# Patient Record
Sex: Female | Born: 1950
Health system: Southern US, Community
[De-identification: ages and names within clinical notes are randomized; demographics above are authoritative.]

## PROBLEM LIST (undated history)

## (undated) DIAGNOSIS — M1990 Unspecified osteoarthritis, unspecified site: Secondary | ICD-10-CM

## (undated) DIAGNOSIS — E079 Disorder of thyroid, unspecified: Secondary | ICD-10-CM

## (undated) DIAGNOSIS — K219 Gastro-esophageal reflux disease without esophagitis: Secondary | ICD-10-CM

## (undated) HISTORY — DX: Disorder of thyroid, unspecified: E07.9

## (undated) HISTORY — DX: Gastro-esophageal reflux disease without esophagitis: K21.9

## (undated) HISTORY — DX: Unspecified osteoarthritis, unspecified site: M19.90

## (undated) HISTORY — PX: SPINE SURGERY: SHX786

## (undated) HISTORY — PX: CERVICAL SPINE SURGERY: SHX589

---

## 1998-01-08 ENCOUNTER — Ambulatory Visit (HOSPITAL_COMMUNITY): Admission: RE | Admit: 1998-01-08 | Discharge: 1998-01-08 | Payer: Self-pay | Admitting: Gastroenterology

## 1999-04-26 ENCOUNTER — Encounter: Admission: RE | Admit: 1999-04-26 | Discharge: 1999-04-26 | Payer: Self-pay | Admitting: Gynecology

## 1999-04-26 ENCOUNTER — Encounter: Payer: Self-pay | Admitting: Gynecology

## 1999-06-24 ENCOUNTER — Ambulatory Visit (HOSPITAL_COMMUNITY): Admission: RE | Admit: 1999-06-24 | Discharge: 1999-06-24 | Payer: Self-pay | Admitting: Gastroenterology

## 2000-03-30 ENCOUNTER — Other Ambulatory Visit: Admission: RE | Admit: 2000-03-30 | Discharge: 2000-03-30 | Payer: Self-pay | Admitting: Gynecology

## 2000-04-27 ENCOUNTER — Encounter: Admission: RE | Admit: 2000-04-27 | Discharge: 2000-04-27 | Payer: Self-pay | Admitting: Gynecology

## 2000-04-27 ENCOUNTER — Encounter: Payer: Self-pay | Admitting: Gynecology

## 2001-03-31 ENCOUNTER — Other Ambulatory Visit: Admission: RE | Admit: 2001-03-31 | Discharge: 2001-03-31 | Payer: Self-pay | Admitting: Gynecology

## 2001-04-28 ENCOUNTER — Encounter: Payer: Self-pay | Admitting: Gynecology

## 2001-04-28 ENCOUNTER — Encounter: Admission: RE | Admit: 2001-04-28 | Discharge: 2001-04-28 | Payer: Self-pay | Admitting: Gynecology

## 2001-07-23 ENCOUNTER — Ambulatory Visit (HOSPITAL_COMMUNITY): Admission: RE | Admit: 2001-07-23 | Discharge: 2001-07-23 | Payer: Self-pay | Admitting: Gastroenterology

## 2001-07-23 ENCOUNTER — Encounter (INDEPENDENT_AMBULATORY_CARE_PROVIDER_SITE_OTHER): Payer: Self-pay | Admitting: Specialist

## 2002-04-04 ENCOUNTER — Other Ambulatory Visit: Admission: RE | Admit: 2002-04-04 | Discharge: 2002-04-04 | Payer: Self-pay | Admitting: Gynecology

## 2002-04-29 ENCOUNTER — Encounter: Payer: Self-pay | Admitting: Gynecology

## 2002-04-29 ENCOUNTER — Encounter: Admission: RE | Admit: 2002-04-29 | Discharge: 2002-04-29 | Payer: Self-pay | Admitting: Gynecology

## 2003-04-06 ENCOUNTER — Other Ambulatory Visit: Admission: RE | Admit: 2003-04-06 | Discharge: 2003-04-06 | Payer: Self-pay | Admitting: Gynecology

## 2003-05-01 ENCOUNTER — Encounter: Admission: RE | Admit: 2003-05-01 | Discharge: 2003-05-01 | Payer: Self-pay | Admitting: Gynecology

## 2004-04-12 ENCOUNTER — Other Ambulatory Visit: Admission: RE | Admit: 2004-04-12 | Discharge: 2004-04-12 | Payer: Self-pay | Admitting: Gynecology

## 2004-05-08 ENCOUNTER — Encounter: Admission: RE | Admit: 2004-05-08 | Discharge: 2004-05-08 | Payer: Self-pay | Admitting: Gynecology

## 2005-04-15 ENCOUNTER — Other Ambulatory Visit: Admission: RE | Admit: 2005-04-15 | Discharge: 2005-04-15 | Payer: Self-pay | Admitting: Gynecology

## 2005-05-21 ENCOUNTER — Encounter: Admission: RE | Admit: 2005-05-21 | Discharge: 2005-05-21 | Payer: Self-pay | Admitting: Gynecology

## 2006-04-16 ENCOUNTER — Other Ambulatory Visit: Admission: RE | Admit: 2006-04-16 | Discharge: 2006-04-16 | Payer: Self-pay | Admitting: Gynecology

## 2006-05-25 ENCOUNTER — Encounter: Admission: RE | Admit: 2006-05-25 | Discharge: 2006-05-25 | Payer: Self-pay | Admitting: Gynecology

## 2007-05-26 ENCOUNTER — Encounter: Admission: RE | Admit: 2007-05-26 | Discharge: 2007-05-26 | Payer: Self-pay | Admitting: Gynecology

## 2008-05-26 ENCOUNTER — Encounter: Admission: RE | Admit: 2008-05-26 | Discharge: 2008-05-26 | Payer: Self-pay | Admitting: Gynecology

## 2009-05-30 ENCOUNTER — Encounter: Admission: RE | Admit: 2009-05-30 | Discharge: 2009-05-30 | Payer: Self-pay | Admitting: Gynecology

## 2010-04-30 ENCOUNTER — Other Ambulatory Visit: Payer: Self-pay | Admitting: Gynecology

## 2010-05-03 ENCOUNTER — Other Ambulatory Visit: Payer: Self-pay | Admitting: Gynecology

## 2010-05-03 DIAGNOSIS — Z1231 Encounter for screening mammogram for malignant neoplasm of breast: Secondary | ICD-10-CM

## 2010-06-03 ENCOUNTER — Ambulatory Visit
Admission: RE | Admit: 2010-06-03 | Discharge: 2010-06-03 | Disposition: A | Discharge status: Home / Self Care | Payer: BC Managed Care – PPO | Source: Ambulatory Visit | Attending: Gynecology | Admitting: Gynecology

## 2010-06-03 DIAGNOSIS — Z1231 Encounter for screening mammogram for malignant neoplasm of breast: Secondary | ICD-10-CM

## 2010-08-09 NOTE — Procedures (Signed)
[place]. [facility]  Patient:    Karla [name], Karla [name]                        MRN: [id number] Proc. Date: 06/24/99 Adm. Date:  [id number] Disc. Date: [id number] Attending:  Deneen Harts CC:         Veverly Fells. Arletha Grippe, M.D.             Gretta Cool, M.D.                           Procedure Report  DATE OF INTERPRETATION:  June 28, 1999.  PROCEDURE:  24-hour interesophageal pH profile.  GASTROENTEROLOGIST:  Griffith Citron, M.D.  INDICATION:  A 60 year old white female with persistent symptoms of a foreign body at the base of the throat.  Improved with Prilosec 20 mg b.i.d.   Recurrent symptoms when reduced dose to Prilosec 20 mg daily.  Undergoing 24-hour interesophageal pH profile to further assess contribution from GE acid reflux.  PROCEDURE:  After reviewing the nature of the procedure with the patient, nasoesophageal intubation with a dual-channel pH probe was successfully completed on the day of study, June 24, 1999.  The study was completed over 24 hours without difficulty.  RESULTS:  In the distal channel, the patient was noted to have a DeMeester score of 46.3 which is strongly significant.  In the upright position, 34 acid reflux episodes occurred, none lasting over 5 minutes.  Total acid exposure was 2.6% of the total study.  In the supine position, the patient experienced fewer episodes, 11 in all; however, 3 lasted over 5 minutes with the longest episode lasting for 37 minutes.  This resulted in 10.2% acid exposure time.  Symptoms of belching were the only ones noted, and these correlated weakly with  acid reflux with a symptom index of 11%.  She experienced no pain symptoms during the course of the study.  In the proximal probe, there were six episodes upright and 4 episodes occurring in a supine position.  Again, when lying supine, two episodes lasted over 5 minutes, the longest lasting for 15 minutes.  ASSESSMENT:   Significant gastroesophageal acid reflux.  The results of this study would suggest symptoms of foreign body sensation at the base of the throat may ell be due to gastroesophageal acid reflux.  RECOMMENDATION:  Aggressive PPI therapy. DD:  06/28/99 TD:  06/29/99 Job: [id number]

## 2011-05-05 ENCOUNTER — Other Ambulatory Visit: Payer: Self-pay | Admitting: Gynecology

## 2011-05-08 ENCOUNTER — Other Ambulatory Visit: Payer: Self-pay | Admitting: Gynecology

## 2011-05-08 DIAGNOSIS — Z1231 Encounter for screening mammogram for malignant neoplasm of breast: Secondary | ICD-10-CM

## 2011-05-22 ENCOUNTER — Other Ambulatory Visit: Payer: Self-pay | Admitting: Family Medicine

## 2011-05-22 DIAGNOSIS — R1013 Epigastric pain: Secondary | ICD-10-CM

## 2011-05-28 ENCOUNTER — Ambulatory Visit
Admission: RE | Admit: 2011-05-28 | Discharge: 2011-05-28 | Disposition: A | Discharge status: Home / Self Care | Payer: BC Managed Care – PPO | Source: Ambulatory Visit | Attending: Family Medicine | Admitting: Family Medicine

## 2011-05-28 DIAGNOSIS — R1013 Epigastric pain: Secondary | ICD-10-CM

## 2011-06-10 ENCOUNTER — Ambulatory Visit: Payer: BC Managed Care – PPO

## 2011-06-17 ENCOUNTER — Ambulatory Visit
Admission: RE | Admit: 2011-06-17 | Discharge: 2011-06-17 | Disposition: A | Discharge status: Home / Self Care | Payer: BC Managed Care – PPO | Source: Ambulatory Visit | Attending: Gynecology | Admitting: Gynecology

## 2011-06-17 DIAGNOSIS — Z1231 Encounter for screening mammogram for malignant neoplasm of breast: Secondary | ICD-10-CM

## 2011-08-27 ENCOUNTER — Other Ambulatory Visit: Payer: Self-pay | Admitting: Neurosurgery

## 2011-08-27 DIAGNOSIS — M5410 Radiculopathy, site unspecified: Secondary | ICD-10-CM

## 2011-08-27 DIAGNOSIS — M542 Cervicalgia: Secondary | ICD-10-CM

## 2011-09-05 ENCOUNTER — Ambulatory Visit
Admission: RE | Admit: 2011-09-05 | Discharge: 2011-09-05 | Disposition: A | Discharge status: Home / Self Care | Payer: BC Managed Care – PPO | Source: Ambulatory Visit | Attending: Neurosurgery | Admitting: Neurosurgery

## 2011-09-05 VITALS — BP 129/63 | HR 61 | Ht 62.0 in | Wt 123.0 lb

## 2011-09-05 DIAGNOSIS — M542 Cervicalgia: Secondary | ICD-10-CM

## 2011-09-05 DIAGNOSIS — M5410 Radiculopathy, site unspecified: Secondary | ICD-10-CM

## 2011-09-05 MED ORDER — DIAZEPAM 5 MG PO TABS
5.0000 mg | ORAL_TABLET | Freq: Once | ORAL | Status: AC
  Administered 2011-09-05: 5 mg via ORAL

## 2011-09-05 MED ORDER — ONDANSETRON HCL 4 MG/2ML IJ SOLN: 4.0000 mg | Freq: Four times a day (QID) | INTRAMUSCULAR | Status: DC | PRN

## 2011-09-05 MED ORDER — IOHEXOL 300 MG/ML  SOLN
9.0000 mL | Freq: Once | INTRAMUSCULAR | Status: AC | PRN
  Administered 2011-09-05: 9 mL via INTRATHECAL

## 2011-09-05 NOTE — Discharge Instructions (Signed)

## 2011-09-05 NOTE — Progress Notes (Signed)
Pt awaiting CT scan.

## 2012-05-18 ENCOUNTER — Other Ambulatory Visit: Payer: Self-pay | Admitting: Gynecology

## 2012-05-31 ENCOUNTER — Other Ambulatory Visit: Payer: Self-pay

## 2012-05-31 DIAGNOSIS — Z1231 Encounter for screening mammogram for malignant neoplasm of breast: Secondary | ICD-10-CM

## 2012-06-24 ENCOUNTER — Ambulatory Visit
Admission: RE | Admit: 2012-06-24 | Discharge: 2012-06-24 | Disposition: A | Discharge status: Home / Self Care | Payer: BC Managed Care – PPO | Source: Ambulatory Visit

## 2012-06-24 DIAGNOSIS — Z1231 Encounter for screening mammogram for malignant neoplasm of breast: Secondary | ICD-10-CM

## 2012-07-16 ENCOUNTER — Other Ambulatory Visit: Payer: Self-pay

## 2012-07-16 MED ORDER — OMEPRAZOLE-SODIUM BICARBONATE 40-1100 MG PO CAPS: 1.0000 | ORAL_CAPSULE | Freq: Every day | ORAL | Status: DC

## 2012-08-18 ENCOUNTER — Other Ambulatory Visit: Payer: Self-pay | Admitting: *Deleted

## 2012-08-18 MED ORDER — LEVOTHYROXINE SODIUM 88 MCG PO TABS: 88.0000 ug | ORAL_TABLET | Freq: Every day | ORAL | Status: DC

## 2012-08-18 NOTE — Telephone Encounter (Signed)
Last TSH was 02/06/12 and said to recheck in 3 weeks, pt hasnt been back

## 2012-09-11 ENCOUNTER — Other Ambulatory Visit: Payer: Self-pay | Admitting: Family Medicine

## 2012-09-13 NOTE — Telephone Encounter (Signed)
LAST OV 11/13. 

## 2012-10-11 ENCOUNTER — Other Ambulatory Visit: Payer: Self-pay | Admitting: Nurse Practitioner

## 2012-10-13 NOTE — Telephone Encounter (Signed)
LAST OV 11/13. MMM PT.

## 2013-01-09 ENCOUNTER — Other Ambulatory Visit: Payer: Self-pay | Admitting: Family Medicine

## 2013-01-11 NOTE — Telephone Encounter (Signed)
Last seen 02/06/12  DFS

## 2013-05-26 ENCOUNTER — Other Ambulatory Visit: Payer: Self-pay | Admitting: Gynecology

## 2013-05-26 DIAGNOSIS — M8580 Other specified disorders of bone density and structure, unspecified site: Secondary | ICD-10-CM

## 2013-05-26 DIAGNOSIS — Z1231 Encounter for screening mammogram for malignant neoplasm of breast: Secondary | ICD-10-CM

## 2013-05-26 DIAGNOSIS — E2839 Other primary ovarian failure: Secondary | ICD-10-CM

## 2013-05-26 DIAGNOSIS — Z780 Asymptomatic menopausal state: Secondary | ICD-10-CM

## 2013-05-30 ENCOUNTER — Other Ambulatory Visit: Payer: Self-pay | Admitting: *Deleted

## 2013-05-30 DIAGNOSIS — E039 Hypothyroidism, unspecified: Secondary | ICD-10-CM

## 2013-05-30 DIAGNOSIS — Z0000 Encounter for general adult medical examination without abnormal findings: Secondary | ICD-10-CM

## 2013-06-17 ENCOUNTER — Ambulatory Visit (INDEPENDENT_AMBULATORY_CARE_PROVIDER_SITE_OTHER): Payer: BC Managed Care – PPO | Admitting: Family Medicine

## 2013-06-17 ENCOUNTER — Encounter (INDEPENDENT_AMBULATORY_CARE_PROVIDER_SITE_OTHER): Payer: Self-pay

## 2013-06-17 VITALS — BP 145/70 | HR 84 | Temp 97.4°F | Ht 61.5 in | Wt 120.8 lb

## 2013-06-17 DIAGNOSIS — Z0000 Encounter for general adult medical examination without abnormal findings: Secondary | ICD-10-CM

## 2013-06-17 DIAGNOSIS — R51 Headache: Secondary | ICD-10-CM

## 2013-06-17 DIAGNOSIS — J029 Acute pharyngitis, unspecified: Secondary | ICD-10-CM

## 2013-06-17 DIAGNOSIS — E039 Hypothyroidism, unspecified: Secondary | ICD-10-CM

## 2013-06-17 DIAGNOSIS — R52 Pain, unspecified: Secondary | ICD-10-CM

## 2013-06-17 LAB — POCT CBC
Granulocyte percent: 62.5 %G (ref 37–80)
HCT, POC: 38.3 % (ref 37.7–47.9)
Hemoglobin: 12.5 g/dL (ref 12.2–16.2)
Lymph, poc: 2.1 (ref 0.6–3.4)
MCH: 31 pg (ref 27–31.2)
MCHC: 32.7 g/dL (ref 31.8–35.4)
MCV: 95 fL (ref 80–97)
MPV: 8.5 fL (ref 0–99.8)
POC Granulocyte: 4 (ref 2–6.9)
POC LYMPH PERCENT: 32.8 %L (ref 10–50)
Platelet Count, POC: 215 10*3/uL (ref 142–424)
RBC: 4 M/uL — AB (ref 4.04–5.48)
RDW, POC: 13 %
WBC: 6.4 10*3/uL (ref 4.6–10.2)

## 2013-06-17 LAB — POCT INFLUENZA A/B
Influenza A, POC: NEGATIVE
Influenza B, POC: NEGATIVE

## 2013-06-17 LAB — POCT RAPID STREP A (OFFICE): Rapid Strep A Screen: NEGATIVE

## 2013-06-17 MED ORDER — AZITHROMYCIN 250 MG PO TABS: ORAL_TABLET | ORAL | Status: DC

## 2013-06-17 NOTE — Progress Notes (Signed)
   Subjective:    Patient ID: Karla [name], female    DOB: Oct 15, 1950, 63 y.o.   MRN: [id number]  HPI This 63 y.o. female presents for evaluation of sore throat.  She has not had fever. She has been having sore throat for 2 days.   Review of Systems C/o sore throat No chest pain, SOB, HA, dizziness, vision change, N/V, diarrhea, constipation, dysuria, urinary urgency or frequency, myalgias, arthralgias or rash.     Objective:   Physical Exam  Vital signs noted  Well developed well nourished female.  HEENT - Head atraumatic Normocephalic                Eyes - PERRLA, Conjuctiva - clear Sclera- Clear EOMI                Ears - EAC's Wnl TM's Wnl Gross Hearing WNL                 Throat - oropharanx wnl Respiratory - Lungs CTA bilateral Cardiac - RRR S1 and S2 without murmur GI - Abdomen soft Nontender and bowel sounds active x 4 Extremities - No edema.  Results for orders placed in visit on 06/17/13  POCT RAPID STREP A (OFFICE)      Result Value Ref Range   Rapid Strep A Screen Negative  Negative  POCT INFLUENZA A/B      Result Value Ref Range   Influenza A, POC Negative     Influenza B, POC Negative    POCT CBC      Result Value Ref Range   WBC 6.4  4.6 - 10.2 K/uL   Lymph, poc 2.1  0.6 - 3.4   POC LYMPH PERCENT 32.8  10 - 50 %L   MID (cbc)    0 - 0.9   POC MID %    0 - 12 %M   POC Granulocyte 4.0  2 - 6.9   Granulocyte percent 62.5  37 - 80 %G   RBC 4.0 (*) 4.04 - 5.48 M/uL   Hemoglobin 12.5  12.2 - 16.2 g/dL   HCT, POC 38.3  37.7 - 47.9 %   MCV 95.0  80 - 97 fL   MCH, POC 31.0  27 - 31.2 pg   MCHC 32.7  31.8 - 35.4 g/dL   RDW, POC 13.0     Platelet Count, POC 215.0  142 - 424 K/uL   MPV 8.5  0 - 99.8 fL       Assessment & Plan:  Sore throat - Plan: POCT rapid strep A, POCT Influenza A/B, azithromycin (ZITHROMAX) 250 MG tablet  Body aches - Plan: POCT rapid strep A, POCT Influenza A/B, azithromycin (ZITHROMAX) 250 MG tablet  Headache(784.0) - Plan: POCT  rapid strep A, POCT Influenza A/B, azithromycin (ZITHROMAX) 250 MG tablet  Hypothyroidism - Plan: POCT CBC, BMP8+EGFR, Hepatic function panel, Thyroid Panel With TSH  Healthcare maintenance - Plan: POCT CBC, BMP8+EGFR, Hepatic function panel, Thyroid Panel With TSH  Push po fluids, rest, tylenol and motrin otc prn as directed for fever, arthralgias, and myalgias.  Follow up prn if sx's continue or persist.  Lysbeth Penner FNP

## 2013-06-18 LAB — BMP8+EGFR
BUN/Creatinine Ratio: 19 (ref 11–26)
BUN: 12 mg/dL (ref 8–27)
CALCIUM: 9.4 mg/dL (ref 8.7–10.3)
CHLORIDE: 102 mmol/L (ref 97–108)
CO2: 27 mmol/L (ref 18–29)
Creatinine, Ser: 0.64 mg/dL (ref 0.57–1.00)
GFR calc Af Amer: 112 mL/min/{1.73_m2} (ref >59)
GFR calc non Af Amer: 97 mL/min/{1.73_m2} (ref >59)
Glucose: 82 mg/dL (ref 65–99)
POTASSIUM: 4.2 mmol/L (ref 3.5–5.2)
SODIUM: 143 mmol/L (ref 134–144)

## 2013-06-18 LAB — HEPATIC FUNCTION PANEL
ALT: 15 IU/L (ref 0–32)
AST: 20 IU/L (ref 0–40)
Albumin: 4.4 g/dL (ref 3.6–4.8)
Alkaline Phosphatase: 66 IU/L (ref 39–117)
Bilirubin, Direct: 0.12 mg/dL (ref 0.00–0.40)
Total Bilirubin: 0.5 mg/dL (ref 0.0–1.2)
Total Protein: 6.9 g/dL (ref 6.0–8.5)

## 2013-06-18 LAB — THYROID PANEL WITH TSH
FREE THYROXINE INDEX: 3.2 (ref 1.2–4.9)
T3 UPTAKE RATIO: 27 % (ref 24–39)
T4, Total: 11.9 ug/dL (ref 4.5–12.0)
TSH: 0.534 u[IU]/mL (ref 0.450–4.500)

## 2013-06-29 ENCOUNTER — Ambulatory Visit
Admission: RE | Admit: 2013-06-29 | Discharge: 2013-06-29 | Disposition: A | Discharge status: Home / Self Care | Payer: BC Managed Care – PPO | Source: Ambulatory Visit | Attending: Gynecology | Admitting: Gynecology

## 2013-06-29 ENCOUNTER — Other Ambulatory Visit: Payer: Self-pay | Admitting: Gynecology

## 2013-06-29 ENCOUNTER — Ambulatory Visit: Payer: BC Managed Care – PPO

## 2013-06-29 DIAGNOSIS — Z1231 Encounter for screening mammogram for malignant neoplasm of breast: Secondary | ICD-10-CM

## 2013-06-29 DIAGNOSIS — Z780 Asymptomatic menopausal state: Secondary | ICD-10-CM

## 2013-06-29 DIAGNOSIS — M8580 Other specified disorders of bone density and structure, unspecified site: Secondary | ICD-10-CM

## 2013-06-29 DIAGNOSIS — E2839 Other primary ovarian failure: Secondary | ICD-10-CM

## 2013-07-12 ENCOUNTER — Other Ambulatory Visit: Payer: Self-pay | Admitting: Family Medicine

## 2013-07-25 ENCOUNTER — Encounter: Payer: Self-pay | Admitting: Family

## 2013-07-25 ENCOUNTER — Ambulatory Visit (INDEPENDENT_AMBULATORY_CARE_PROVIDER_SITE_OTHER): Payer: BC Managed Care – PPO | Admitting: Family

## 2013-07-25 VITALS — BP 138/71 | HR 70 | Temp 97.5°F | Ht 62.0 in | Wt 120.8 lb

## 2013-07-25 DIAGNOSIS — S30860A Insect bite (nonvenomous) of lower back and pelvis, initial encounter: Secondary | ICD-10-CM

## 2013-07-25 DIAGNOSIS — E039 Hypothyroidism, unspecified: Secondary | ICD-10-CM | POA: Insufficient documentation

## 2013-07-25 DIAGNOSIS — W57XXXA Bitten or stung by nonvenomous insect and other nonvenomous arthropods, initial encounter: Principal | ICD-10-CM

## 2013-07-25 DIAGNOSIS — K219 Gastro-esophageal reflux disease without esophagitis: Secondary | ICD-10-CM | POA: Insufficient documentation

## 2013-07-25 MED ORDER — DOXYCYCLINE HYCLATE 100 MG PO TABS: 100.0000 mg | ORAL_TABLET | Freq: Two times a day (BID) | ORAL | Status: DC

## 2013-07-25 NOTE — Progress Notes (Signed)
   Subjective:    Patient ID: Karla [name], female    DOB: Oct 29, 1950, 63 y.o.   MRN: [id number]  HPI Pt reports removing a tick off of her bottom on Saturday. Pt states it is red and swollen about a quarter size. Pt reports itching but no pain. Pt denies any fever or joint pain.    Review of Systems  Respiratory: Negative.   Cardiovascular: Negative.   Gastrointestinal: Negative.   Genitourinary: Negative.   All other systems reviewed and are negative.      Objective:   Physical Exam  Vitals reviewed. Constitutional: She is oriented to person, place, and time. She appears well-developed and well-nourished. No distress.  Cardiovascular: Normal rate, regular rhythm, normal heart sounds and intact distal pulses.   No murmur heard. Pulmonary/Chest: Effort normal and breath sounds normal. No respiratory distress. She has no wheezes.  Musculoskeletal: Normal range of motion. She exhibits no edema and no tenderness.  Neurological: She is alert and oriented to person, place, and time. She has normal reflexes. No cranial nerve deficit.  Skin: Skin is warm and dry. There is erythema.  Small erythema circular spot with mild swelling on left inner gluteal fold     Psychiatric: She has a normal mood and affect. Her behavior is normal. Judgment and thought content normal.    BP 138/71  Pulse 70  Temp(Src) 97.5 F (36.4 C) (Oral)  Ht 5\' 2"  (1.575 m)  Wt 120 lb 12.8 oz (54.795 kg)  BMI 22.09 kg/m2       Assessment & Plan:  1. Tick bite of buttock -Wear protective clothing while outside -Check site for redness or any s/s of infection -Report any fever, rash, or joint pain - doxycycline (VIBRA-TABS) 100 MG tablet; Take 1 tablet (100 mg total) by mouth 2 (two) times daily.  Dispense: 28 tablet; Refill: 0 -RTO prn if s/s worsen   Jannifer Rodneyhristy Raesha Coonrod, FNP

## 2013-07-25 NOTE — Patient Instructions (Signed)
Tick Bite Information Ticks are insects that attach themselves to the skin and draw blood for food. There are various types of ticks. Common types include wood ticks and deer ticks. Most ticks live in shrubs and grassy areas. Ticks can climb onto your body when you make contact with leaves or grass where the tick is waiting. The most common places on the body for ticks to attach themselves are the scalp, neck, armpits, waist, and groin. Most tick bites are harmless, but sometimes ticks carry germs that cause diseases. These germs can be spread to a person during the tick's feeding process. The chance of a disease spreading through a tick bite depends on:   The type of tick.  Time of year.   How long the tick is attached.   Geographic location.  HOW CAN YOU PREVENT TICK BITES? Take these steps to help prevent tick bites when you are outdoors:  Wear protective clothing. Long sleeves and long pants are best.   Wear white clothes so you can see ticks more easily.  Tuck your pant legs into your socks.   If walking on a trail, stay in the middle of the trail to avoid brushing against bushes.  Avoid walking through areas with long grass.  Put insect repellent on all exposed skin and along boot tops, pant legs, and sleeve cuffs.   Check clothing, hair, and skin repeatedly and before going inside.   Brush off any ticks that are not attached.  Take a shower or bath as soon as possible after being outdoors.  WHAT IS THE PROPER WAY TO REMOVE A TICK? Ticks should be removed as soon as possible to help prevent diseases caused by tick bites. 1. If latex gloves are available, put them on before trying to remove a tick.  2. Using fine-point tweezers, grasp the tick as close to the skin as possible. You may also use curved forceps or a tick removal tool. Grasp the tick as close to its head as possible. Avoid grasping the tick on its body. 3. Pull gently with steady upward pressure until  the tick lets go. Do not twist the tick or jerk it suddenly. This may break off the tick's head or mouth parts. 4. Do not squeeze or crush the tick's body. This could force disease-carrying fluids from the tick into your body.  5. After the tick is removed, wash the bite area and your hands with soap and water or other disinfectant such as alcohol. 6. Apply a small amount of antiseptic cream or ointment to the bite site.  7. Wash and disinfect any instruments that were used.  Do not try to remove a tick by applying a hot match, petroleum jelly, or fingernail polish to the tick. These methods do not work and may increase the chances of disease being spread from the tick bite.  WHEN SHOULD YOU SEEK MEDICAL CARE? Contact your health care provider if you are unable to remove a tick from your skin or if a part of the tick breaks off and is stuck in the skin.  After a tick bite, you need to be aware of signs and symptoms that could be related to diseases spread by ticks. Contact your health care provider if you develop any of the following in the days or weeks after the tick bite:  Unexplained fever.  Rash. A circular rash that appears days or weeks after the tick bite may indicate the possibility of Lyme disease. The rash may resemble   a target with a bull's-eye and may occur at a different part of your body than the tick bite.  Redness and swelling in the area of the tick bite.   Tender, swollen lymph glands.   Diarrhea.   Weight loss.   Cough.   Fatigue.   Muscle, joint, or bone pain.   Abdominal pain.   Headache.   Lethargy or a change in your level of consciousness.  Difficulty walking or moving your legs.   Numbness in the legs.   Paralysis.  Shortness of breath.   Confusion.   Repeated vomiting.  Document Released: 03/07/2000 Document Revised: 12/29/2012 Document Reviewed: 08/18/2012 [employer] Patient Information 2014 [employer].  

## 2013-12-01 ENCOUNTER — Ambulatory Visit (INDEPENDENT_AMBULATORY_CARE_PROVIDER_SITE_OTHER): Payer: BC Managed Care – PPO | Admitting: Family Medicine

## 2013-12-01 ENCOUNTER — Encounter: Payer: Self-pay | Admitting: Family Medicine

## 2013-12-01 ENCOUNTER — Ambulatory Visit (INDEPENDENT_AMBULATORY_CARE_PROVIDER_SITE_OTHER): Payer: BC Managed Care – PPO

## 2013-12-01 VITALS — BP 142/80 | HR 76 | Temp 97.8°F | Ht 62.0 in | Wt 120.0 lb

## 2013-12-01 DIAGNOSIS — K219 Gastro-esophageal reflux disease without esophagitis: Secondary | ICD-10-CM

## 2013-12-01 DIAGNOSIS — E039 Hypothyroidism, unspecified: Secondary | ICD-10-CM

## 2013-12-01 DIAGNOSIS — E559 Vitamin D deficiency, unspecified: Secondary | ICD-10-CM

## 2013-12-01 DIAGNOSIS — IMO0001 Reserved for inherently not codable concepts without codable children: Secondary | ICD-10-CM

## 2013-12-01 DIAGNOSIS — R030 Elevated blood-pressure reading, without diagnosis of hypertension: Secondary | ICD-10-CM

## 2013-12-01 DIAGNOSIS — Z0000 Encounter for general adult medical examination without abnormal findings: Secondary | ICD-10-CM

## 2013-12-01 DIAGNOSIS — Z23 Encounter for immunization: Secondary | ICD-10-CM

## 2013-12-01 LAB — POCT CBC
GRANULOCYTE PERCENT: 52.3 % (ref 37–80)
HCT, POC: 37.6 % — AB (ref 37.7–47.9)
Hemoglobin: 12.9 g/dL (ref 12.2–16.2)
LYMPH, POC: 1.9 (ref 0.6–3.4)
MCH: 32.7 pg — AB (ref 27–31.2)
MCHC: 34.4 g/dL (ref 31.8–35.4)
MCV: 95 fL (ref 80–97)
MPV: 8.2 fL (ref 0–99.8)
PLATELET COUNT, POC: 232 10*3/uL (ref 142–424)
POC Granulocyte: 2.5 (ref 2–6.9)
POC LYMPH PERCENT: 39.9 %L (ref 10–50)
RBC: 4 M/uL — AB (ref 4.04–5.48)
RDW, POC: 12.1 %
WBC: 4.8 10*3/uL (ref 4.6–10.2)

## 2013-12-01 MED ORDER — SYNTHROID 88 MCG PO TABS: 88.0000 ug | ORAL_TABLET | Freq: Every day | ORAL | Status: DC

## 2013-12-01 NOTE — Patient Instructions (Addendum)
Continue current medications. Continue good therapeutic lifestyle changes which include good diet and exercise. Fall precautions discussed with patient. If an FOBT was given today- please return it to our front desk. If you are over 61 years old - you may need Prevnar 13 or the adult Pneumonia vaccine.  Flu Shots will be available at our office starting mid- September. Please call and schedule a FLU CLINIC APPOINTMENT.   Continue to drink plenty of fluids, avoid dairy products and caffeine products as much as possible Check with your insurance regarding immunizations of shingles shot, Prevnar vaccination and tetanus Return the FOBT. Consider Align, a probiotic and taking this once daily

## 2013-12-01 NOTE — Progress Notes (Signed)
Subjective:    Patient ID: Karla [name], female    DOB: [birth year], 63 y.o.   MRN: [id number]  HPI Pt here for follow up and management of chronic medical problems. The patient has a history of hypothyroidism and GERD. She is also concerned that she may have irritable bowel syndrome. She also thinks she may have had a recent virus and this could be playing a role with her GI irritability. She is up-to-date on most of her health maintenance issues except for the shingles shot, Prevnar vaccination, and Tdap. She will also need to get a chest x-ray today. She will also return and FOBT. Her home blood pressure readings will be scanned into the record and they are good.        Patient Active Problem List   Diagnosis Date Noted  . GERD (gastroesophageal reflux disease) 07/25/2013  . Hypothyroidism 07/25/2013   Outpatient Encounter Prescriptions as of 12/01/2013  Medication Sig  . calcium carbonate 1250 MG capsule Take 1,250 mg by mouth 2 (two) times daily with a meal.  . Cholecalciferol (VITAMIN D) 2000 UNITS CAPS Take 1 capsule by mouth daily.  Marland Kitchen estradiol (ESTRACE) 0.1 MG/GM vaginal cream Place 1 Applicatorful vaginally 2 (two) times a week.  . ibandronate (BONIVA) 150 MG tablet Take 150 mg by mouth every 30 (thirty) days. Take in the morning with a full glass of water, on an empty stomach, and do not take anything else by mouth or lie down for the next 30 min.  Marland Kitchen omeprazole-sodium bicarbonate (ZEGERID) 40-1100 MG per capsule TAKE 1 CAPSULE BY MOUTH DAILY BEFORE BREAKFAST.  . SYNTHROID 88 MCG tablet Take 1 tablet (88 mcg total) by mouth daily before breakfast.  . [DISCONTINUED] levothyroxine (SYNTHROID, LEVOTHROID) 88 MCG tablet Take 1 tablet (88 mcg total) by mouth daily.  . [DISCONTINUED] doxycycline (VIBRA-TABS) 100 MG tablet Take 1 tablet (100 mg total) by mouth 2 (two) times daily.  . [DISCONTINUED] omeprazole-sodium bicarbonate (ZEGERID) 40-1100 MG per capsule TAKE 1 CAPSULE BY MOUTH  DAILY BEFORE BREAKFAST.    Review of Systems  Constitutional: Negative.   HENT: Negative.   Eyes: Negative.   Respiratory: Negative.   Cardiovascular: Negative.   Gastrointestinal: Negative.        Possible IBS- abdominal discomfort at times  Endocrine: Negative.   Genitourinary: Negative.   Musculoskeletal: Negative.   Skin: Negative.   Allergic/Immunologic: Negative.   Neurological: Negative.   Hematological: Negative.   Psychiatric/Behavioral: Negative.        Objective:   Physical Exam  Nursing note and vitals reviewed. Constitutional: She is oriented to person, place, and time. She appears well-developed and well-nourished. No distress.  HENT:  Head: Normocephalic.  Right Ear: External ear normal.  Left Ear: External ear normal.  Nose: Nose normal.  Mouth/Throat: Oropharynx is clear and moist.  Eyes: Conjunctivae and EOM are normal. Pupils are equal, round, and reactive to light. Right eye exhibits no discharge. Left eye exhibits no discharge. No scleral icterus.  Neck: Normal range of motion. Neck supple. No thyromegaly present.  Cardiovascular: Normal rate, regular rhythm, normal heart sounds and intact distal pulses.  Exam reveals no gallop and no friction rub.   No murmur heard. At 72 per minute  Pulmonary/Chest: Effort normal and breath sounds normal. No respiratory distress. She has no wheezes. She has no rales. She exhibits no tenderness.  Abdominal: Soft. Bowel sounds are normal. She exhibits no mass. There is no tenderness. There is no rebound and no guarding.  Musculoskeletal: Normal range of motion. She exhibits no edema and no tenderness.  Lymphadenopathy:    She has no cervical adenopathy.  Neurological: She is alert and oriented to person, place, and time. She has normal reflexes. No cranial nerve deficit.  Skin: Skin is warm and dry.  Psychiatric: She has a normal mood and affect. Her behavior is normal. Judgment and thought content normal.   BP  154/73  Pulse 76  Temp(Src) 97.8 F (36.6 C) (Oral)  Ht _0  (1.575 m)  Wt 120 lb (54.432 kg)  BMI 21.94 kg/m2  [facility] reading (PRIMARY) by  Dr. Brunilda Payor x-ray-- no active disease                                      Assessment & Plan:  1. Hypothyroidism, unspecified hypothyroidism type - POCT CBC - BMP8+EGFR - Hepatic function panel - NMR, lipoprofile - Thyroid Panel With TSH  2. Gastroesophageal reflux disease, esophagitis presence not specified - POCT CBC - BMP8+EGFR - Hepatic function panel - NMR, lipoprofile - DG Chest 2 View; Future  3. Health care maintenance - POCT CBC - BMP8+EGFR - Hepatic function panel - NMR, lipoprofile - DG Chest 2 View; Future  4. Vitamin D deficiency - Vit D  25 hydroxy (rtn osteoporosis monitoring)  5. Elevated blood pressure -Bring blood pressure cuff back and correlate with our readings  Meds ordered this encounter  Medications  . SYNTHROID 88 MCG tablet    Sig: Take 1 tablet (88 mcg total) by mouth daily before breakfast.    Dispense:  90 tablet    Refill:  3   Patient Instructions  Continue current medications. Continue good therapeutic lifestyle changes which include good diet and exercise. Fall precautions discussed with patient. If an FOBT was given today- please return it to our front desk. If you are over 61 years old - you may need Prevnar 20 or the adult Pneumonia vaccine.  Flu Shots will be available at our office starting mid- September. Please call and schedule a FLU CLINIC APPOINTMENT.   Continue to drink plenty of fluids, avoid dairy products and caffeine products as much as possible Check with your insurance regarding immunizations of shingles shot, Prevnar vaccination and tetanus Return the FOBT. Consider Align, a probiotic and taking this once daily   Arrie Senate MD

## 2013-12-02 ENCOUNTER — Telehealth: Payer: Self-pay | Admitting: Family Medicine

## 2013-12-02 ENCOUNTER — Telehealth: Payer: Self-pay

## 2013-12-02 LAB — NMR, LIPOPROFILE
Cholesterol: 165 mg/dL (ref 100–199)
HDL CHOLESTEROL BY NMR: 55 mg/dL (ref >39)
HDL Particle Number: 34.4 umol/L (ref >=30.5)
LDL Particle Number: 1281 nmol/L — ABNORMAL HIGH (ref <1000)
LDL Size: 21.5 nm (ref >20.5)
LDLC SERPL CALC-MCNC: 98 mg/dL (ref 0–99)
LP-IR Score: <25 (ref <=45)
Small LDL Particle Number: 524 nmol/L (ref <=527)
TRIGLYCERIDES BY NMR: 61 mg/dL (ref 0–149)

## 2013-12-02 LAB — BMP8+EGFR
BUN/Creatinine Ratio: 18 (ref 11–26)
BUN: 11 mg/dL (ref 8–27)
CALCIUM: 9.3 mg/dL (ref 8.7–10.3)
CO2: 28 mmol/L (ref 18–29)
CREATININE: 0.6 mg/dL (ref 0.57–1.00)
Chloride: 102 mmol/L (ref 97–108)
GFR calc Af Amer: 114 mL/min/{1.73_m2} (ref >59)
GFR calc non Af Amer: 99 mL/min/{1.73_m2} (ref >59)
Glucose: 83 mg/dL (ref 65–99)
Potassium: 4.1 mmol/L (ref 3.5–5.2)
SODIUM: 144 mmol/L (ref 134–144)

## 2013-12-02 LAB — THYROID PANEL WITH TSH
FREE THYROXINE INDEX: 3.5 (ref 1.2–4.9)
T3 UPTAKE RATIO: 28 % (ref 24–39)
T4 TOTAL: 12.6 ug/dL — AB (ref 4.5–12.0)
TSH: 1.03 u[IU]/mL (ref 0.450–4.500)

## 2013-12-02 LAB — HEPATIC FUNCTION PANEL
ALT: 34 IU/L — AB (ref 0–32)
AST: 34 IU/L (ref 0–40)
Albumin: 4.4 g/dL (ref 3.6–4.8)
Alkaline Phosphatase: 47 IU/L (ref 39–117)
BILIRUBIN TOTAL: 0.7 mg/dL (ref 0.0–1.2)
Bilirubin, Direct: 0.14 mg/dL (ref 0.00–0.40)
Total Protein: 7 g/dL (ref 6.0–8.5)

## 2013-12-02 LAB — VITAMIN D 25 HYDROXY (VIT D DEFICIENCY, FRACTURES): Vit D, 25-Hydroxy: 76.8 ng/mL (ref 30.0–100.0)

## 2013-12-02 NOTE — Telephone Encounter (Signed)
LM on home vm with CXR results as per DPR of 05/2013

## 2013-12-02 NOTE — Telephone Encounter (Signed)
Message copied by Roselee Culver on Fri Dec 02, 2013  8:42 AM ------      Message from: Ernestina Penna      Created: Thu Dec 01, 2013  1:25 PM       As per radiology report ------

## 2013-12-05 NOTE — Telephone Encounter (Signed)
Pt's insurance does cover zostavax and prev 13. She will plan to get these.

## 2013-12-30 ENCOUNTER — Telehealth: Payer: Self-pay | Admitting: Family Medicine

## 2013-12-30 NOTE — Telephone Encounter (Signed)
Advised that since both are inactive there is no minimum waiting period between injections. Appt scheduled for next week for flu shot. Patient aware.

## 2014-01-04 ENCOUNTER — Ambulatory Visit (INDEPENDENT_AMBULATORY_CARE_PROVIDER_SITE_OTHER): Payer: BC Managed Care – PPO

## 2014-01-04 DIAGNOSIS — Z23 Encounter for immunization: Secondary | ICD-10-CM

## 2014-02-22 ENCOUNTER — Other Ambulatory Visit: Payer: Self-pay | Admitting: Family Medicine

## 2014-02-27 LAB — FECAL OCCULT BLOOD, IMMUNOCHEMICAL: FECAL OCCULT BLD: NEGATIVE

## 2014-05-26 ENCOUNTER — Other Ambulatory Visit: Payer: Self-pay

## 2014-05-26 DIAGNOSIS — Z1231 Encounter for screening mammogram for malignant neoplasm of breast: Secondary | ICD-10-CM

## 2014-06-05 ENCOUNTER — Ambulatory Visit (INDEPENDENT_AMBULATORY_CARE_PROVIDER_SITE_OTHER): Payer: BLUE CROSS/BLUE SHIELD | Admitting: Family Medicine

## 2014-06-05 ENCOUNTER — Telehealth: Payer: Self-pay | Admitting: Family Medicine

## 2014-06-05 ENCOUNTER — Encounter: Payer: Self-pay | Admitting: Family Medicine

## 2014-06-05 VITALS — BP 142/72 | HR 81 | Temp 97.1°F | Ht 62.0 in | Wt 123.0 lb

## 2014-06-05 DIAGNOSIS — Z23 Encounter for immunization: Secondary | ICD-10-CM | POA: Diagnosis not present

## 2014-06-05 DIAGNOSIS — R030 Elevated blood-pressure reading, without diagnosis of hypertension: Secondary | ICD-10-CM

## 2014-06-05 DIAGNOSIS — R7989 Other specified abnormal findings of blood chemistry: Secondary | ICD-10-CM | POA: Diagnosis not present

## 2014-06-05 DIAGNOSIS — IMO0001 Reserved for inherently not codable concepts without codable children: Secondary | ICD-10-CM

## 2014-06-05 DIAGNOSIS — K219 Gastro-esophageal reflux disease without esophagitis: Secondary | ICD-10-CM

## 2014-06-05 DIAGNOSIS — E559 Vitamin D deficiency, unspecified: Secondary | ICD-10-CM | POA: Diagnosis not present

## 2014-06-05 DIAGNOSIS — E039 Hypothyroidism, unspecified: Secondary | ICD-10-CM

## 2014-06-05 LAB — POCT CBC
Granulocyte percent: 40.1 %G (ref 37–80)
HEMATOCRIT: 41.8 % (ref 37.7–47.9)
HEMOGLOBIN: 13 g/dL (ref 12.2–16.2)
LYMPH, POC: 2.7 (ref 0.6–3.4)
MCH: 30.2 pg (ref 27–31.2)
MCHC: 31.2 g/dL — AB (ref 31.8–35.4)
MCV: 96.7 fL (ref 80–97)
MPV: 8.3 fL (ref 0–99.8)
PLATELET COUNT, POC: 267 10*3/uL (ref 142–424)
POC Granulocyte: 2 (ref 2–6.9)
POC LYMPH PERCENT: 53.7 %L — AB (ref 10–50)
RBC: 4.32 M/uL (ref 4.04–5.48)
RDW, POC: 12.2 %
WBC: 5 10*3/uL (ref 4.6–10.2)

## 2014-06-05 NOTE — Patient Instructions (Addendum)
Continue current medications. Continue good therapeutic lifestyle changes which include good diet and exercise. Fall precautions discussed with patient. If an FOBT was given today- please return it to our front desk. If you are over 62 years old - you may need Prevnar 13 or the adult Pneumonia vaccine.  Flu Shots are still available at our office. If you still haven't had one please call to set up a nurse visit to get one.   After your visit with us today you will receive a survey in the mail or online from [employer] regarding your care with us. Please take a moment to fill this out. Your feedback is very important to us as you can help us better understand your patient needs as well as improve your experience and satisfaction. WE CARE ABOUT YOU!!!   The patient should continue to take her regular medicines We will call her with the results of the lab work as soon as it becomes available and she should take a copy of this to see her gastroenterologist and gynecologist at the time of her next visit with them. She should continue to walk and exercise regularly She should also consider getting an HIV screen as this is now being recommended

## 2014-06-05 NOTE — Progress Notes (Signed)
Subjective:    Patient ID: Karla [name], female    DOB: 1950/12/08, 64 y.o.   MRN: [id number]  HPI Pt here for follow up and management of chronic medical problems which includes hypothyroid or GERD. She is taking medications regularly. The patient is doing well as far as her reflux is concerned and her thyroid is concerned. She's not having any GERD symptoms and not having any palpitations, trouble swallowing edema etc. regarding the thyroid. She is walking and exercising daily. She brings in blood pressure readings from the outside all of these are excellent and they will be scanned into the record. She also had a recent blood pressure reading last week in the doctor's office it was 118/64. There is a family history of hypertension in both her dad and mom. The patient has also had a hepatitis screen and an outside office. In this was negative. Her health maintenance issues she is up-to-date on everything except for her shingles shot and she will get that today.         Patient Active Problem List   Diagnosis Date Noted  . GERD (gastroesophageal reflux disease) 07/25/2013  . Hypothyroidism 07/25/2013   Outpatient Encounter Prescriptions as of 06/05/2014  Medication Sig  . calcium carbonate 1250 MG capsule Take 500 mg by mouth daily.   . Cholecalciferol (VITAMIN D) 2000 UNITS CAPS Take 1 capsule by mouth daily.  Marland Kitchen DEXILANT 60 MG capsule Take 1 capsule by mouth daily.  Marland Kitchen estradiol (ESTRACE) 0.1 MG/GM vaginal cream Place 1 Applicatorful vaginally 2 (two) times a week.  . ibandronate (BONIVA) 150 MG tablet Take 150 mg by mouth every 30 (thirty) days. Take in the morning with a full glass of water, on an empty stomach, and do not take anything else by mouth or lie down for the next 30 min.  Marland Kitchen RESTASIS 0.05 % ophthalmic emulsion   . SYNTHROID 88 MCG tablet Take 1 tablet (88 mcg total) by mouth daily before breakfast.  . [DISCONTINUED] omeprazole-sodium bicarbonate (ZEGERID) 40-1100 MG per  capsule TAKE 1 CAPSULE BY MOUTH DAILY BEFORE BREAKFAST.    Review of Systems  Constitutional: Negative.   HENT: Negative.   Eyes: Negative.   Respiratory: Negative.   Cardiovascular: Negative.   Gastrointestinal: Negative.   Endocrine: Negative.   Genitourinary: Negative.   Musculoskeletal: Negative.   Skin: Negative.   Allergic/Immunologic: Negative.   Neurological: Negative.   Hematological: Negative.   Psychiatric/Behavioral: Negative.        Objective:   Physical Exam  Constitutional: She is oriented to person, place, and time. She appears well-developed and well-nourished. No distress.  HENT:  Head: Normocephalic and atraumatic.  Right Ear: External ear normal.  Left Ear: External ear normal.  Nose: Nose normal.  Mouth/Throat: Oropharynx is clear and moist.  Eyes: Conjunctivae and EOM are normal. Pupils are equal, round, and reactive to light. Right eye exhibits no discharge. Left eye exhibits no discharge. No scleral icterus.  Neck: Normal range of motion. Neck supple. No thyromegaly present.  Without thyroid bruits or cervical adenopathy  Cardiovascular: Normal rate, regular rhythm, normal heart sounds and intact distal pulses.  Exam reveals no gallop and no friction rub.   No murmur heard. At 72/m  Pulmonary/Chest: Effort normal and breath sounds normal. No respiratory distress. She has no wheezes. She has no rales. She exhibits no tenderness.  Clear anteriorly and posteriorly  Abdominal: Soft. Bowel sounds are normal. She exhibits no mass. There is no tenderness. There is no  rebound and no guarding.  No epigastric tenderness  Musculoskeletal: Normal range of motion. She exhibits no edema or tenderness.  Lymphadenopathy:    She has no cervical adenopathy.  Neurological: She is alert and oriented to person, place, and time. She has normal reflexes. No cranial nerve deficit.  Skin: Skin is warm and dry. No rash noted.  Psychiatric: She has a normal mood and affect.  Her behavior is normal. Judgment and thought content normal.  Nursing note and vitals reviewed.  BP 142/72 mmHg  Pulse 81  Temp(Src) 97.1 F (36.2 C) (Oral)  Ht '5\' 2"'  (1.575 m)  Wt 123 lb (55.792 kg)  BMI 22.49 kg/m2        Assessment & Plan:  1. Hypothyroidism, unspecified hypothyroidism type -The patient is feeling well regarding her thyroid and any adjustments in medicine will be done following lab work being drawn today. - POCT CBC - Thyroid Panel With TSH  2. Gastroesophageal reflux disease, esophagitis presence not specified -Patient is not having any reflux symptoms and she should continue with her current excellent - POCT CBC - Hepatic function panel  3. Vitamin D deficiency -She does have a history of osteopenia and patient should continue with her vitamin D and calcium replacement -She should also continue walking daily and get her DEXA scans on a regular basis -She should continue with her Boniva - POCT CBC - Vit D  25 hydroxy (rtn osteoporosis monitoring)  4. Elevated blood pressure -Patient brings in outside readings and all of these are good. One of the readings came from the outside doctor's office and this was also good. She should continue to watch her sodium intake and exercise regularly - POCT CBC - BMP8+EGFR - Hepatic function panel - NMR, lipoprofile  5. Need for shingles vaccine -This will be given today - Varicella-zoster vaccine subcutaneous  Meds ordered this encounter  Medications  . DEXILANT 60 MG capsule    Sig: Take 1 capsule by mouth daily.  . RESTASIS 0.05 % ophthalmic emulsion    Sig:     Refill:  3   Patient Instructions  Continue current medications. Continue good therapeutic lifestyle changes which include good diet and exercise. Fall precautions discussed with patient. If an FOBT was given today- please return it to our front desk. If you are over 62 years old - you may need Prevnar 42 or the adult Pneumonia vaccine.  Flu  Shots are still available at our office. If you still haven't had one please call to set up a nurse visit to get one.   After your visit with [place] today you will receive a survey in the mail or online from [employer] regarding your care with [place]. Please take a moment to fill this out. Your feedback is very important to [place] as you can help [place] better understand your patient needs as well as improve your experience and satisfaction. WE CARE ABOUT YOU!!!   The patient should continue to take her regular medicines We will call her with the results of the lab work as soon as it becomes available and she should take a copy of this to see her gastroenterologist and gynecologist at the time of her next visit with them. She should continue to walk and exercise regularly She should also consider getting an HIV screen as this is now being recommended   Arrie Senate MD

## 2014-06-05 NOTE — Addendum Note (Signed)
Addended by: Magdalene RiverBULLINS, JAMIE H on: 06/05/2014 10:40 AM   Modules accepted: Orders

## 2014-06-05 NOTE — Telephone Encounter (Signed)
Spoke with patient.

## 2014-06-06 LAB — BMP8+EGFR
BUN / CREAT RATIO: 19 (ref 11–26)
BUN: 12 mg/dL (ref 8–27)
CO2: 25 mmol/L (ref 18–29)
Calcium: 9 mg/dL (ref 8.7–10.3)
Chloride: 102 mmol/L (ref 97–108)
Creatinine, Ser: 0.64 mg/dL (ref 0.57–1.00)
GFR calc non Af Amer: 97 mL/min/{1.73_m2} (ref >59)
GFR, EST AFRICAN AMERICAN: 111 mL/min/{1.73_m2} (ref >59)
Glucose: 80 mg/dL (ref 65–99)
Potassium: 4 mmol/L (ref 3.5–5.2)
Sodium: 144 mmol/L (ref 134–144)

## 2014-06-06 LAB — THYROID PANEL WITH TSH
Free Thyroxine Index: 2.9 (ref 1.2–4.9)
T3 Uptake Ratio: 26 % (ref 24–39)
T4, Total: 11.1 ug/dL (ref 4.5–12.0)
TSH: 1.69 u[IU]/mL (ref 0.450–4.500)

## 2014-06-06 LAB — NMR, LIPOPROFILE
Cholesterol: 174 mg/dL (ref 100–199)
HDL CHOLESTEROL BY NMR: 60 mg/dL (ref >39)
HDL Particle Number: 34.7 umol/L (ref >=30.5)
LDL PARTICLE NUMBER: 1294 nmol/L — AB (ref <1000)
LDL Size: 21.7 nm (ref >20.5)
LDL-C: 102 mg/dL — AB (ref 0–99)
LP-IR Score: <25 (ref <=45)
Small LDL Particle Number: 282 nmol/L (ref <=527)
Triglycerides by NMR: 62 mg/dL (ref 0–149)

## 2014-06-06 LAB — HEPATIC FUNCTION PANEL
ALBUMIN: 4.6 g/dL (ref 3.6–4.8)
ALK PHOS: 55 IU/L (ref 39–117)
ALT: 21 IU/L (ref 0–32)
AST: 27 IU/L (ref 0–40)
Bilirubin Total: 1.3 mg/dL — ABNORMAL HIGH (ref 0.0–1.2)
Bilirubin, Direct: 0.23 mg/dL (ref 0.00–0.40)
Total Protein: 7.3 g/dL (ref 6.0–8.5)

## 2014-06-06 LAB — VITAMIN D 25 HYDROXY (VIT D DEFICIENCY, FRACTURES): Vit D, 25-Hydroxy: 49.9 ng/mL (ref 30.0–100.0)

## 2014-06-30 ENCOUNTER — Other Ambulatory Visit (INDEPENDENT_AMBULATORY_CARE_PROVIDER_SITE_OTHER): Payer: BLUE CROSS/BLUE SHIELD

## 2014-06-30 DIAGNOSIS — R7989 Other specified abnormal findings of blood chemistry: Secondary | ICD-10-CM

## 2014-06-30 LAB — POCT CBC
Granulocyte percent: 53.3 %G (ref 37–80)
HCT, POC: 41.3 % (ref 37.7–47.9)
Hemoglobin: 13.1 g/dL (ref 12.2–16.2)
Lymph, poc: 2.2 (ref 0.6–3.4)
MCH, POC: 30.5 pg (ref 27–31.2)
MCHC: 31.7 g/dL — AB (ref 31.8–35.4)
MCV: 96.1 fL (ref 80–97)
MPV: 8.3 fL (ref 0–99.8)
PLATELET COUNT, POC: 323 10*3/uL (ref 142–424)
POC Granulocyte: 2.9 (ref 2–6.9)
POC LYMPH %: 40.7 % (ref 10–50)
RBC: 4.29 M/uL (ref 4.04–5.48)
RDW, POC: 12.3 %
WBC: 5.5 10*3/uL (ref 4.6–10.2)

## 2014-06-30 NOTE — Progress Notes (Signed)
Lab only 

## 2014-07-03 ENCOUNTER — Ambulatory Visit
Admission: RE | Admit: 2014-07-03 | Discharge: 2014-07-03 | Disposition: A | Discharge status: Home / Self Care | Payer: BLUE CROSS/BLUE SHIELD | Source: Ambulatory Visit

## 2014-07-03 DIAGNOSIS — Z1231 Encounter for screening mammogram for malignant neoplasm of breast: Secondary | ICD-10-CM

## 2014-07-04 ENCOUNTER — Telehealth: Payer: Self-pay | Admitting: Family Medicine

## 2014-07-05 NOTE — Telephone Encounter (Signed)
Labs mailed 07/05/14 to patient.

## 2014-08-31 ENCOUNTER — Encounter: Payer: Self-pay | Admitting: Pharmacist

## 2014-08-31 ENCOUNTER — Ambulatory Visit (INDEPENDENT_AMBULATORY_CARE_PROVIDER_SITE_OTHER): Payer: BLUE CROSS/BLUE SHIELD | Admitting: Pharmacist

## 2014-08-31 VITALS — BP 122/72 | HR 75 | Ht 62.0 in | Wt 124.0 lb

## 2014-08-31 DIAGNOSIS — E7800 Pure hypercholesterolemia, unspecified: Secondary | ICD-10-CM

## 2014-08-31 NOTE — Progress Notes (Signed)
Lipid Clinic Consultation  Chief Complaint:   Chief Complaint  Patient presents with  . Hyperlipidemia   HPI:  Patient referred by Dr Christell Constant to address elevated LDL-P and discussed more aggressive TLC. Patient has never taken statin medication.  She does not take medication for HTN and does not have DM.   Her mother is 62yo and use to take medication for elevated cholesterol and also has HTN.   Her father has HTN.     Component Value Date/Time   CHOL 174 06/05/2014 0901   TRIG 62 06/05/2014 0901   HDL 60 06/05/2014 0901   Body mass index is 22.67 kg/(m^2).   Filed Vitals:   08/31/14 0841  BP: 122/72  Pulse: 75    CHD/CHF Risk Equivalents:  none  AHA 10 year ASCVD risk = 3.7% Primary Problem(s):  LDL or LDL-P elevated  Current NCEP Goals: LDL Goal < 100 HDL Goal >/= 45 Tg Goal < 802 Non-HDL Goal < 130  Secondary cause of hyperlipidemia present:  none Low fat diet followed?  Yes -   Low carb diet followed?  Yes -   Exercise?  Yes - walking 30 minutes a day  Assessment: LDL particle number elevated but patient at low risk of ASCVD.    Recommendations: 1.  Discussed mediterranean diet 2. Continue daily exercise 3.  Discussed CVD risk and ways to reduce risk.  4.  Recheck lipids in 3-6 months  Time spent counseling patient:  25 minutes    Henrene Pastor, PharmD, CPP

## 2014-11-10 ENCOUNTER — Other Ambulatory Visit: Payer: Self-pay | Admitting: Family Medicine

## 2014-11-16 ENCOUNTER — Other Ambulatory Visit: Payer: Self-pay | Admitting: Family Medicine

## 2014-12-08 ENCOUNTER — Ambulatory Visit (INDEPENDENT_AMBULATORY_CARE_PROVIDER_SITE_OTHER): Payer: BLUE CROSS/BLUE SHIELD | Admitting: Family Medicine

## 2014-12-08 ENCOUNTER — Encounter: Payer: Self-pay | Admitting: Family Medicine

## 2014-12-08 VITALS — BP 131/66 | HR 70 | Temp 97.2°F | Ht 62.0 in | Wt 123.0 lb

## 2014-12-08 DIAGNOSIS — M8580 Other specified disorders of bone density and structure, unspecified site: Secondary | ICD-10-CM | POA: Diagnosis not present

## 2014-12-08 DIAGNOSIS — E559 Vitamin D deficiency, unspecified: Secondary | ICD-10-CM

## 2014-12-08 DIAGNOSIS — R030 Elevated blood-pressure reading, without diagnosis of hypertension: Secondary | ICD-10-CM | POA: Diagnosis not present

## 2014-12-08 DIAGNOSIS — N959 Unspecified menopausal and perimenopausal disorder: Secondary | ICD-10-CM | POA: Diagnosis not present

## 2014-12-08 DIAGNOSIS — E039 Hypothyroidism, unspecified: Secondary | ICD-10-CM | POA: Diagnosis not present

## 2014-12-08 DIAGNOSIS — K219 Gastro-esophageal reflux disease without esophagitis: Secondary | ICD-10-CM

## 2014-12-08 DIAGNOSIS — IMO0001 Reserved for inherently not codable concepts without codable children: Secondary | ICD-10-CM

## 2014-12-08 MED ORDER — SYNTHROID 88 MCG PO TABS: ORAL_TABLET | ORAL | Status: DC

## 2014-12-08 NOTE — Patient Instructions (Addendum)
Continue current medications. Continue good therapeutic lifestyle changes which include good diet and exercise. Fall precautions discussed with patient. If an FOBT was given today- please return it to our front desk. If you are over 62 years old - you may need Prevnar 13 or the adult Pneumonia vaccine.  **Flu shots will be available soon--- please call and schedule a FLU-CLINIC appointment**  After your visit with [place] today you will receive a survey in the mail or online from [employer] regarding your care with [place]. Please take a moment to fill this out. Your feedback is very important to [place] as you can help [place] better understand your patient needs as well as improve your experience and satisfaction. WE CARE ABOUT YOU!!!   **Please join [place] SEPT.22, 2016 from 5:00 to 7:00pm for our OPEN HOUSE! Come out and meet our NEW providers** The patient should continue to walk and exercise regularly She should follow-up with her gynecologist as planned She should get her mammogram regularly She should follow-up with GI as recommended every 5 years for colonoscopy She should do her FOBT yearly She should continue to take her vitamin D and calcium regularly

## 2014-12-08 NOTE — Progress Notes (Signed)
Subjective:    Patient ID: Karla [name], female    DOB: Jul 27, 1950, 64 y.o.   MRN: [id number]  HPI Pt here for follow up and management of chronic medical problems which includes hypothyroid and vit D def. She is taking medications regularly. The patient is doing well with no complaints or symptoms. She is on hormone replacement therapy from her gynecologist using Estrace cream. She is also on thyroid replacement and medication for GERD. Her last thyroid profile about 6 months ago had her thyroid within normal limits. The patient denies fatigue or edema in her feet. She denies chest pain shortness of breath problems swallowing heartburn indigestion nausea vomiting diarrhea or blood in the stool. She is passing her water regularly without symptoms. She is up-to-date on her eye exams and her Pap smears.       Patient Active Problem List   Diagnosis Date Noted  . GERD (gastroesophageal reflux disease) 07/25/2013  . Hypothyroidism 07/25/2013   Outpatient Encounter Prescriptions as of 12/08/2014  Medication Sig  . calcium carbonate 1250 MG capsule Take 250 mg by mouth daily.   . Cholecalciferol (VITAMIN D) 2000 UNITS CAPS Take 1 capsule by mouth daily.  Marland Kitchen DEXILANT 60 MG capsule Take 1 capsule by mouth daily.  Marland Kitchen estradiol (ESTRACE) 0.1 MG/GM vaginal cream Place 1 Applicatorful vaginally 2 (two) times a week.  . ibandronate (BONIVA) 150 MG tablet Take 150 mg by mouth every 30 (thirty) days. Take in the morning with a full glass of water, on an empty stomach, and do not take anything else by mouth or lie down for the next 30 min.  Marland Kitchen RESTASIS 0.05 % ophthalmic emulsion   . SYNTHROID 88 MCG tablet TAKE 1 TABLET (88 MCG TOTAL) BY MOUTH DAILY BEFORE BREAKFAST.  . [DISCONTINUED] SYNTHROID 88 MCG tablet TAKE 1 TABLET (88 MCG TOTAL) BY MOUTH DAILY BEFORE BREAKFAST.   No facility-administered encounter medications on file as of 12/08/2014.      Review of Systems  Constitutional: Negative.   HENT:  Negative.   Eyes: Negative.   Respiratory: Negative.   Cardiovascular: Negative.   Gastrointestinal: Negative.   Endocrine: Negative.   Genitourinary: Negative.   Musculoskeletal: Negative.   Skin: Negative.   Allergic/Immunologic: Negative.   Neurological: Negative.   Hematological: Negative.   Psychiatric/Behavioral: Negative.        Objective:   Physical Exam  Constitutional: She is oriented to person, place, and time. She appears well-developed and well-nourished. No distress.  HENT:  Head: Normocephalic and atraumatic.  Right Ear: External ear normal.  Left Ear: External ear normal.  Nose: Nose normal.  Mouth/Throat: Oropharynx is clear and moist. No oropharyngeal exudate.  Eyes: Conjunctivae and EOM are normal. Pupils are equal, round, and reactive to light. Right eye exhibits no discharge. Left eye exhibits no discharge. No scleral icterus.  Neck: Normal range of motion. Neck supple. No thyromegaly present.  Without thyroid enlargement bruits or anterior cervical adenopathy  Cardiovascular: Normal rate, regular rhythm, normal heart sounds and intact distal pulses.  Exam reveals no gallop and no friction rub.   No murmur heard. The heart has a regular rate and rhythm at 60/m without murmurs  Pulmonary/Chest: Effort normal and breath sounds normal. No respiratory distress. She has no wheezes. She has no rales. She exhibits no tenderness.  Clear anteriorly and posteriorly  Abdominal: Soft. Bowel sounds are normal. She exhibits no mass. There is no tenderness. There is no rebound and no guarding.  Nontender without epigastric  discomfort or suprapubic discomfort and no organ enlargement or masses  Musculoskeletal: Normal range of motion. She exhibits no edema or tenderness.  Lymphadenopathy:    She has no cervical adenopathy.  Neurological: She is alert and oriented to person, place, and time. She has normal reflexes. No cranial nerve deficit.  Skin: Skin is warm and dry. No  rash noted.  Psychiatric: She has a normal mood and affect. Her behavior is normal. Judgment and thought content normal.  Nursing note and vitals reviewed.  BP 131/66 mmHg  Pulse 70  Temp(Src) 97.2 F (36.2 C) (Oral)  Ht '5\' 2"'  (1.575 m)  Wt 123 lb (55.792 kg)  BMI 22.49 kg/m2        Assessment & Plan:  1. Hypothyroidism, unspecified hypothyroidism type -Pending results of upcoming lab work no change in treatment - Thyroid Panel With TSH  2. Gastroesophageal reflux disease, esophagitis presence not specified -The patient is having no GERD symptoms and she will continue with her current PPI - Hepatic function panel - NMR, lipoprofile  3. Vitamin D deficiency -She does have a history of osteopenia and vitamin D deficiency and she will continue her current treatment pending results of lab work - Vit D  25 hydroxy (rtn osteoporosis monitoring)  4. Elevated blood pressure -Home blood pressure readings were brought in for review and these are much better than those obtained in the office. She will continue monitoring her sodium intake and keeping her weight down and watching her diet closely. - BMP8+EGFR - Hepatic function panel - NMR, lipoprofile  5. Menopausal and perimenopausal disorder -She will continue with Estrace cream per recommendation of GYN  6. Osteopenia -The patient's next mammogram is due in April 2017  No orders of the defined types were placed in this encounter.   The patient's thyroid medicine will be refilled for 1 year.  Patient Instructions  Continue current medications. Continue good therapeutic lifestyle changes which include good diet and exercise. Fall precautions discussed with patient. If an FOBT was given today- please return it to our front desk. If you are over 54 years old - you may need Prevnar 57 or the adult Pneumonia vaccine.  **Flu shots will be available soon--- please call and schedule a FLU-CLINIC appointment**  After your visit  with [place] today you will receive a survey in the mail or online from [employer] regarding your care with [place]. Please take a moment to fill this out. Your feedback is very important to [place] as you can help [place] better understand your patient needs as well as improve your experience and satisfaction. WE CARE ABOUT YOU!!!   **Please join [place] SEPT.[email or web address], 2016 from 5:00 to 7:00pm for our OPEN HOUSE! Come out and meet our NEW providers** The patient should continue to walk and exercise regularly She should follow-up with her gynecologist as planned She should get her mammogram regularly She should follow-up with GI as recommended every 5 years for colonoscopy She should do her FOBT yearly She should continue to take her vitamin D and calcium regularly   Arrie Senate MD

## 2014-12-09 LAB — BMP8+EGFR
BUN/Creatinine Ratio: 20 (ref 11–26)
BUN: 14 mg/dL (ref 8–27)
CALCIUM: 9.1 mg/dL (ref 8.7–10.3)
CO2: 26 mmol/L (ref 18–29)
Chloride: 100 mmol/L (ref 97–108)
Creatinine, Ser: 0.69 mg/dL (ref 0.57–1.00)
GFR, EST AFRICAN AMERICAN: 108 mL/min/{1.73_m2} (ref >59)
GFR, EST NON AFRICAN AMERICAN: 94 mL/min/{1.73_m2} (ref >59)
Glucose: 81 mg/dL (ref 65–99)
Potassium: 4.3 mmol/L (ref 3.5–5.2)
Sodium: 141 mmol/L (ref 134–144)

## 2014-12-09 LAB — THYROID PANEL WITH TSH
Free Thyroxine Index: 3.1 (ref 1.2–4.9)
T3 Uptake Ratio: 27 % (ref 24–39)
T4 TOTAL: 11.6 ug/dL (ref 4.5–12.0)
TSH: 2.36 u[IU]/mL (ref 0.450–4.500)

## 2014-12-09 LAB — NMR, LIPOPROFILE
Cholesterol: 187 mg/dL (ref 100–199)
HDL Cholesterol by NMR: 54 mg/dL (ref >39)
HDL Particle Number: 33.3 umol/L (ref >=30.5)
LDL Particle Number: 1294 nmol/L — ABNORMAL HIGH (ref <1000)
LDL Size: 21.9 nm (ref >20.5)
LDL-C: 120 mg/dL — ABNORMAL HIGH (ref 0–99)
LP-IR Score: <25 (ref <=45)
SMALL LDL PARTICLE NUMBER: 233 nmol/L (ref <=527)
Triglycerides by NMR: 65 mg/dL (ref 0–149)

## 2014-12-09 LAB — HEPATIC FUNCTION PANEL
ALT: 18 IU/L (ref 0–32)
AST: 23 IU/L (ref 0–40)
Albumin: 4.5 g/dL (ref 3.6–4.8)
Alkaline Phosphatase: 51 IU/L (ref 39–117)
BILIRUBIN TOTAL: 0.9 mg/dL (ref 0.0–1.2)
BILIRUBIN, DIRECT: 0.19 mg/dL (ref 0.00–0.40)
Total Protein: 7.2 g/dL (ref 6.0–8.5)

## 2014-12-09 LAB — VITAMIN D 25 HYDROXY (VIT D DEFICIENCY, FRACTURES): Vit D, 25-Hydroxy: 53.1 ng/mL (ref 30.0–100.0)

## 2014-12-22 ENCOUNTER — Ambulatory Visit (INDEPENDENT_AMBULATORY_CARE_PROVIDER_SITE_OTHER): Payer: BLUE CROSS/BLUE SHIELD

## 2014-12-22 DIAGNOSIS — Z23 Encounter for immunization: Secondary | ICD-10-CM

## 2015-03-12 ENCOUNTER — Ambulatory Visit (INDEPENDENT_AMBULATORY_CARE_PROVIDER_SITE_OTHER): Payer: 59 | Admitting: Nurse Practitioner

## 2015-03-12 VITALS — BP 162/79 | HR 79 | Temp 97.1°F | Ht 62.0 in | Wt 124.0 lb

## 2015-03-12 DIAGNOSIS — R300 Dysuria: Secondary | ICD-10-CM | POA: Diagnosis not present

## 2015-03-12 DIAGNOSIS — N3000 Acute cystitis without hematuria: Secondary | ICD-10-CM

## 2015-03-12 LAB — POCT URINALYSIS DIPSTICK
Bilirubin, UA: NEGATIVE
Glucose, UA: NEGATIVE
KETONES UA: NEGATIVE
NITRITE UA: NEGATIVE
Protein, UA: NEGATIVE
Spec Grav, UA: 1.015
Urobilinogen, UA: NEGATIVE
pH, UA: 7

## 2015-03-12 LAB — POCT UA - MICROSCOPIC ONLY
Casts, Ur, LPF, POC: NEGATIVE
Crystals, Ur, HPF, POC: NEGATIVE
Yeast, UA: NEGATIVE

## 2015-03-12 MED ORDER — NITROFURANTOIN MONOHYD MACRO 100 MG PO CAPS: 100.0000 mg | ORAL_CAPSULE | Freq: Two times a day (BID) | ORAL | Status: DC

## 2015-03-12 NOTE — Progress Notes (Signed)
  Subjective:    Karla AlkenSheila Barcellos is a 64 y.o. female who complains of burning with urination, dysuria, frequency, nocturia, suprapubic pressure and urgency. She has had symptoms for 4 days. Patient also complains of none. Patient denies back pain, stomach ache and vaginal discharge. Patient does not have a history of recurrent UTI. Patient does not have a history of pyelonephritis.   The following portions of the patient's history were reviewed and updated as appropriate: allergies, current medications, past family history, past medical history, past social history, past surgical history and problem list.  Review of Systems Pertinent items are noted in HPI.    Objective:    General appearance: alert and cooperative Lungs: clear to auscultation bilaterally Heart: regular rate and rhythm, S1, S2 normal, no murmur, click, rub or gallop Abdomen: normal findings: soft, non-tender  Laboratory:  Results for orders placed or performed in visit on 03/12/15  POCT UA - Microscopic Only  Result Value Ref Range   WBC, Ur, HPF, POC 1-5    RBC, urine, microscopic occ    Bacteria, U Microscopic occ    Mucus, UA moderate    Epithelial cells, urine per micros occ    Crystals, Ur, HPF, POC negative    Casts, Ur, LPF, POC negative    Yeast, UA negative   POCT urinalysis dipstick  Result Value Ref Range   Color, UA gold    Clarity, UA clear    Glucose, UA negative    Bilirubin, UA negative    Ketones, UA negative    Spec Grav, UA 1.015    Blood, UA trace    pH, UA 7.0    Protein, UA negative    Urobilinogen, UA negative    Nitrite, UA negative    Leukocytes, UA small (1+) (A) Negative      Assessment:    Acute cystitis     Plan:  Take medication as prescribe Cotton underwear Take shower not bath Cranberry juice, yogurt Force fluids AZO over the counter X2 days Culture pending RTO prn   Meds ordered this encounter  Medications  . Omeprazole-Sodium Bicarbonate (ZEGERID OTC PO)   Sig: Take by mouth.  . nitrofurantoin, macrocrystal-monohydrate, (MACROBID) 100 MG capsule    Sig: Take 1 capsule (100 mg total) by mouth 2 (two) times daily. 1 po BId    Dispense:  14 capsule    Refill:  0    Order Specific Question:  Supervising Provider    Answer:  Ernestina PennaMOORE, DONALD W [[id number]    Mary-Margaret Daphine DeutscherMartin, FNP

## 2015-03-12 NOTE — Patient Instructions (Signed)
Take medication as prescribe Cotton underwear Take shower not bath Cranberry juice, yogurt Force fluids AZO over the counter X2 days Culture pending RTO prn  

## 2015-05-31 ENCOUNTER — Other Ambulatory Visit: Payer: Self-pay

## 2015-05-31 DIAGNOSIS — Z1231 Encounter for screening mammogram for malignant neoplasm of breast: Secondary | ICD-10-CM

## 2015-06-15 ENCOUNTER — Ambulatory Visit (INDEPENDENT_AMBULATORY_CARE_PROVIDER_SITE_OTHER): Payer: 59 | Admitting: Family Medicine

## 2015-06-15 ENCOUNTER — Encounter: Payer: Self-pay | Admitting: Family Medicine

## 2015-06-15 VITALS — BP 131/80 | HR 80 | Temp 97.1°F | Ht 62.0 in | Wt 125.0 lb

## 2015-06-15 DIAGNOSIS — E039 Hypothyroidism, unspecified: Secondary | ICD-10-CM | POA: Diagnosis not present

## 2015-06-15 DIAGNOSIS — R030 Elevated blood-pressure reading, without diagnosis of hypertension: Secondary | ICD-10-CM

## 2015-06-15 DIAGNOSIS — Z1211 Encounter for screening for malignant neoplasm of colon: Secondary | ICD-10-CM

## 2015-06-15 DIAGNOSIS — E559 Vitamin D deficiency, unspecified: Secondary | ICD-10-CM

## 2015-06-15 DIAGNOSIS — K219 Gastro-esophageal reflux disease without esophagitis: Secondary | ICD-10-CM | POA: Diagnosis not present

## 2015-06-15 DIAGNOSIS — IMO0001 Reserved for inherently not codable concepts without codable children: Secondary | ICD-10-CM

## 2015-06-15 NOTE — Progress Notes (Signed)
Subjective:    Patient ID: Karla [name], female    DOB: August 29, 1950, 65 y.o.   MRN: [id number]  HPI Pt here for follow up and management of chronic medical problems which includes hypothyroid and GERD. She is taking medications regularly.The patient is doing well overall. She does complain of some back pain. This occurs at times. She is due to return an FOBT and get lab work. She is planning to get her pelvic exam next week and her mammogram and April. She will also get a DEXA scan sometime this spring. The patient thinks her back pains due to her posture at work and she sits a lot. They just got some new ergonomic chairs which she is hoping will help a lot. The pain is in her upper back. She otherwise denies any chest pain shortness of breath trouble swallowing heartburn indigestion nausea vomiting diarrhea or blood in the stool. She denies abdominal pain. She is passing her water without problems. She sees the gastroenterologist yearly and he has suggested to her that she try Zantac instead of omeprazole and she plans to do this. She understands that she has to take this twice daily before breakfast and supper. The patient brings in home blood pressures for review and all of these were excellent and these readings will be scanned into the record.      Patient Active Problem List   Diagnosis Date Noted  . Menopausal and perimenopausal disorder 12/08/2014  . GERD (gastroesophageal reflux disease) 07/25/2013  . Hypothyroidism 07/25/2013   Outpatient Encounter Prescriptions as of 06/15/2015  Medication Sig  . calcium carbonate 1250 MG capsule Take 250 mg by mouth daily.   . Cholecalciferol (VITAMIN D) 2000 UNITS CAPS Take 1 capsule by mouth daily.  Marland Kitchen estradiol (ESTRACE) 0.1 MG/GM vaginal cream Place 1 Applicatorful vaginally 2 (two) times a week.  . ibandronate (BONIVA) 150 MG tablet Take 150 mg by mouth every 30 (thirty) days. Take in the morning with a full glass of water, on an empty stomach,  and do not take anything else by mouth or lie down for the next 30 min.  Earney Navy Bicarbonate (ZEGERID OTC PO) Take by mouth.  . RESTASIS 0.05 % ophthalmic emulsion   . SYNTHROID 88 MCG tablet TAKE 1 TABLET (88 MCG TOTAL) BY MOUTH DAILY BEFORE BREAKFAST.  . [DISCONTINUED] nitrofurantoin, macrocrystal-monohydrate, (MACROBID) 100 MG capsule Take 1 capsule (100 mg total) by mouth 2 (two) times daily. 1 po BId   No facility-administered encounter medications on file as of 06/15/2015.     Review of Systems  Constitutional: Negative.   HENT: Negative.   Eyes: Negative.   Respiratory: Negative.   Cardiovascular: Negative.   Gastrointestinal: Negative.   Endocrine: Negative.   Genitourinary: Negative.   Musculoskeletal: Positive for back pain.  Skin: Negative.   Allergic/Immunologic: Negative.   Neurological: Negative.   Hematological: Negative.   Psychiatric/Behavioral: Negative.        Objective:   Physical Exam  Constitutional: She is oriented to person, place, and time. She appears well-developed and well-nourished.  HENT:  Head: Normocephalic and atraumatic.  Right Ear: External ear normal.  Left Ear: External ear normal.  Nose: Nose normal.  Mouth/Throat: Oropharynx is clear and moist.  Eyes: Conjunctivae and EOM are normal. Pupils are equal, round, and reactive to light. Right eye exhibits no discharge. Left eye exhibits no discharge. No scleral icterus.  Neck: Normal range of motion. Neck supple. No JVD present. No thyromegaly present.  Cardiovascular: Normal  rate, regular rhythm, normal heart sounds and intact distal pulses.   No murmur heard. The rhythm is regular at 72/m  Pulmonary/Chest: Effort normal and breath sounds normal. No respiratory distress. She has no wheezes. She has no rales.  Abdominal: Soft. Bowel sounds are normal. She exhibits no mass. There is no tenderness. There is no rebound and no guarding.  Musculoskeletal: Normal range of motion. She  exhibits no edema or tenderness.  Lymphadenopathy:    She has no cervical adenopathy.  Neurological: She is alert and oriented to person, place, and time. She has normal reflexes. No cranial nerve deficit.  Skin: Skin is warm and dry. No rash noted.  Psychiatric: She has a normal mood and affect. Her behavior is normal. Judgment and thought content normal.  Nursing note and vitals reviewed.  BP 131/80 mmHg  Pulse 80  Temp(Src) 97.1 F (36.2 C) (Oral)  Ht _0  (1.575 m)  Wt 125 lb (56.7 kg)  BMI 22.86 kg/m2        Assessment & Plan:  1. Gastroesophageal reflux disease, esophagitis presence not specified -Try Zantac 150 twice daily before breakfast and supper and hold the omeprazole - CBC with Differential/Platelet  2. Hypothyroidism, unspecified hypothyroidism type -Continue current treatment pending results of lab work - CBC with Differential/Platelet - Thyroid Panel With TSH  3. Vitamin D deficiency -Continue vitamin D replacement pending results of lab work - CBC with Differential/Platelet - VITAMIN D 25 Hydroxy (Vit-D Deficiency, Fractures)  4. Elevated blood pressure -Blood pressure readings today in the office and those reviewed from home are all good and the patient will continue to monitor the blood pressure periodically and watch her sodium intake - BMP8+EGFR - CBC with Differential/Platelet - Hepatic function panel - NMR, lipoprofile  5. Special screening for malignant neoplasms, colon -Follow-up with gastroenterology as planned - Fecal occult blood, imunochemical; Future  No orders of the defined types were placed in this encounter.   Patient Instructions                       Medicare Annual Wellness Visit  [facility] and the medical providers at [facility] strive to bring you the best medical care.  In doing so we not only want to address your current medical conditions and concerns but also to detect new conditions early and  prevent illness, disease and health-related problems.    Medicare offers a yearly Wellness Visit which allows our clinical staff to assess your need for preventative services including immunizations, lifestyle education, counseling to decrease risk of preventable diseases and screening for fall risk and other medical concerns.    This visit is provided free of charge (no copay) for all Medicare recipients. The clinical pharmacists at [facility] have begun to conduct these Wellness Visits which will also include a thorough review of all your medications.    As you primary medical provider recommend that you make an appointment for your Annual Wellness Visit if you have not done so already this year.  You may set up this appointment before you leave today or you may call back ([phone]) and schedule an appointment.  Please make sure when you call that you mention that you are scheduling your Annual Wellness Visit with the clinical pharmacist so that the appointment may be made for the proper length of time.     Continue current medications. Continue good therapeutic lifestyle changes which include good diet and exercise.  Fall precautions discussed with patient. If an FOBT was given today- please return it to our front desk. If you are over 79 years old - you may need Prevnar 79 or the adult Pneumonia vaccine.  **Flu shots are available--- please call and schedule a FLU-CLINIC appointment**  After your visit with [place] today you will receive a survey in the mail or online from [employer] regarding your care with [place]. Please take a moment to fill this out. Your feedback is very important to [place] as you can help [place] better understand your patient needs as well as improve your experience and satisfaction. WE CARE ABOUT YOU!!!   Continue to avoid NSAIDs caffeine fried foods alcohol and chocolates to avoid GI irritation Try Zantac 150 twice daily before breakfast and supper and try  leaving off the omeprazole. If you have severe heartburn with this regimen you can increase the Zantac to 300 mg twice daily. Continue to follow-up with gynecology for pelvic and Pap smear Get mammogram as planned Get DEXA scan Make sure that we get copies of all of these reports including your eye exam once that is done Stay active and drink plenty of fluids and maintain good posture   Arrie Senate MD

## 2015-06-15 NOTE — Patient Instructions (Addendum)
Medicare Annual Wellness Visit  [facility] and the medical providers at [facility] strive to bring you the best medical care.  In doing so we not only want to address your current medical conditions and concerns but also to detect new conditions early and prevent illness, disease and health-related problems.    Medicare offers a yearly Wellness Visit which allows our clinical staff to assess your need for preventative services including immunizations, lifestyle education, counseling to decrease risk of preventable diseases and screening for fall risk and other medical concerns.    This visit is provided free of charge (no copay) for all Medicare recipients. The clinical pharmacists at [facility] have begun to conduct these Wellness Visits which will also include a thorough review of all your medications.    As you primary medical provider recommend that you make an appointment for your Annual Wellness Visit if you have not done so already this year.  You may set up this appointment before you leave today or you may call back [phone] and schedule an appointment.  Please make sure when you call that you mention that you are scheduling your Annual Wellness Visit with the clinical pharmacist so that the appointment may be made for the proper length of time.     Continue current medications. Continue good therapeutic lifestyle changes which include good diet and exercise. Fall precautions discussed with patient. If an FOBT was given today- please return it to our front desk. If you are over 63 years old - you may need Prevnar 13 or the adult Pneumonia vaccine.  **Flu shots are available--- please call and schedule a FLU-CLINIC appointment**  After your visit with us today you will receive a survey in the mail or online from [employer] regarding your care with us. Please take a moment to fill this out. Your feedback is very  important to us as you can help us better understand your patient needs as well as improve your experience and satisfaction. WE CARE ABOUT YOU!!!   Continue to avoid NSAIDs caffeine fried foods alcohol and chocolates to avoid GI irritation Try Zantac 150 twice daily before breakfast and supper and try leaving off the omeprazole. If you have severe heartburn with this regimen you can increase the Zantac to 300 mg twice daily. Continue to follow-up with gynecology for pelvic and Pap smear Get mammogram as planned Get DEXA scan Make sure that we get copies of all of these reports including your eye exam once that is done Stay active and drink plenty of fluids and maintain good posture

## 2015-06-16 LAB — NMR, LIPOPROFILE
CHOLESTEROL: 176 mg/dL (ref 100–199)
HDL CHOLESTEROL BY NMR: 58 mg/dL (ref >39)
HDL PARTICLE NUMBER: 33.8 umol/L (ref >=30.5)
LDL PARTICLE NUMBER: 1225 nmol/L — AB (ref <1000)
LDL Size: 21.9 nm (ref >20.5)
LDL-C: 103 mg/dL — AB (ref 0–99)
Small LDL Particle Number: 225 nmol/L (ref <=527)
TRIGLYCERIDES BY NMR: 75 mg/dL (ref 0–149)

## 2015-06-16 LAB — CBC WITH DIFFERENTIAL/PLATELET
BASOS ABS: 0 10*3/uL (ref 0.0–0.2)
Basos: 1 %
EOS (ABSOLUTE): 0 10*3/uL (ref 0.0–0.4)
Eos: 1 %
HEMOGLOBIN: 13.2 g/dL (ref 11.1–15.9)
Hematocrit: 39.3 % (ref 34.0–46.6)
Immature Grans (Abs): 0 10*3/uL (ref 0.0–0.1)
Immature Granulocytes: 0 %
LYMPHS ABS: 1.9 10*3/uL (ref 0.7–3.1)
Lymphs: 45 %
MCH: 32 pg (ref 26.6–33.0)
MCHC: 33.6 g/dL (ref 31.5–35.7)
MCV: 95 fL (ref 79–97)
MONOCYTES: 7 %
MONOS ABS: 0.3 10*3/uL (ref 0.1–0.9)
Neutrophils Absolute: 2 10*3/uL (ref 1.4–7.0)
Neutrophils: 46 %
Platelets: 283 10*3/uL (ref 150–379)
RBC: 4.12 x10E6/uL (ref 3.77–5.28)
RDW: 13.4 % (ref 12.3–15.4)
WBC: 4.3 10*3/uL (ref 3.4–10.8)

## 2015-06-16 LAB — BMP8+EGFR
BUN / CREAT RATIO: 25 (ref 11–26)
BUN: 17 mg/dL (ref 8–27)
CHLORIDE: 101 mmol/L (ref 96–106)
CO2: 27 mmol/L (ref 18–29)
Calcium: 9.1 mg/dL (ref 8.7–10.3)
Creatinine, Ser: 0.67 mg/dL (ref 0.57–1.00)
GFR calc Af Amer: 109 mL/min/{1.73_m2} (ref >59)
GFR calc non Af Amer: 95 mL/min/{1.73_m2} (ref >59)
GLUCOSE: 81 mg/dL (ref 65–99)
POTASSIUM: 4.1 mmol/L (ref 3.5–5.2)
Sodium: 142 mmol/L (ref 134–144)

## 2015-06-16 LAB — HEPATIC FUNCTION PANEL
ALT: 18 IU/L (ref 0–32)
AST: 21 IU/L (ref 0–40)
Albumin: 4.8 g/dL (ref 3.6–4.8)
Alkaline Phosphatase: 47 IU/L (ref 39–117)
Bilirubin Total: 1 mg/dL (ref 0.0–1.2)
Bilirubin, Direct: 0.21 mg/dL (ref 0.00–0.40)
TOTAL PROTEIN: 7.3 g/dL (ref 6.0–8.5)

## 2015-06-16 LAB — THYROID PANEL WITH TSH
Free Thyroxine Index: 3 (ref 1.2–4.9)
T3 UPTAKE RATIO: 27 % (ref 24–39)
T4 TOTAL: 11.2 ug/dL (ref 4.5–12.0)
TSH: 3.42 u[IU]/mL (ref 0.450–4.500)

## 2015-06-16 LAB — VITAMIN D 25 HYDROXY (VIT D DEFICIENCY, FRACTURES): Vit D, 25-Hydroxy: 52 ng/mL (ref 30.0–100.0)

## 2015-07-05 ENCOUNTER — Ambulatory Visit
Admission: RE | Admit: 2015-07-05 | Discharge: 2015-07-05 | Disposition: A | Discharge status: Home / Self Care | Payer: PRIVATE HEALTH INSURANCE | Source: Ambulatory Visit

## 2015-07-05 DIAGNOSIS — Z1231 Encounter for screening mammogram for malignant neoplasm of breast: Secondary | ICD-10-CM

## 2015-08-10 ENCOUNTER — Other Ambulatory Visit: Payer: 59

## 2015-08-10 DIAGNOSIS — Z1211 Encounter for screening for malignant neoplasm of colon: Secondary | ICD-10-CM

## 2015-08-14 LAB — FECAL OCCULT BLOOD, IMMUNOCHEMICAL: FECAL OCCULT BLD: NEGATIVE

## 2015-12-14 ENCOUNTER — Other Ambulatory Visit: Payer: Self-pay | Admitting: Family Medicine

## 2015-12-19 ENCOUNTER — Encounter: Payer: Self-pay | Admitting: Family Medicine

## 2015-12-19 ENCOUNTER — Ambulatory Visit (INDEPENDENT_AMBULATORY_CARE_PROVIDER_SITE_OTHER): Payer: 59

## 2015-12-19 ENCOUNTER — Ambulatory Visit (INDEPENDENT_AMBULATORY_CARE_PROVIDER_SITE_OTHER): Payer: 59 | Admitting: Family Medicine

## 2015-12-19 VITALS — BP 127/60 | HR 84 | Temp 97.3°F | Ht 62.0 in | Wt 124.0 lb

## 2015-12-19 DIAGNOSIS — R030 Elevated blood-pressure reading, without diagnosis of hypertension: Secondary | ICD-10-CM

## 2015-12-19 DIAGNOSIS — E039 Hypothyroidism, unspecified: Secondary | ICD-10-CM | POA: Diagnosis not present

## 2015-12-19 DIAGNOSIS — E559 Vitamin D deficiency, unspecified: Secondary | ICD-10-CM

## 2015-12-19 DIAGNOSIS — IMO0001 Reserved for inherently not codable concepts without codable children: Secondary | ICD-10-CM

## 2015-12-19 DIAGNOSIS — K219 Gastro-esophageal reflux disease without esophagitis: Secondary | ICD-10-CM | POA: Diagnosis not present

## 2015-12-19 DIAGNOSIS — Z23 Encounter for immunization: Secondary | ICD-10-CM | POA: Diagnosis not present

## 2015-12-19 MED ORDER — LEVOTHYROXINE SODIUM 88 MCG PO TABS: ORAL_TABLET | ORAL | 3 refills | Status: DC

## 2015-12-19 MED ORDER — LEVOTHYROXINE SODIUM 88 MCG PO TABS: 88.0000 ug | ORAL_TABLET | Freq: Every day | ORAL | 3 refills | Status: DC

## 2015-12-19 NOTE — Progress Notes (Signed)
Subjective:    Patient ID: Karla [name], female    DOB: May 21, 1950, 65 y.o.   MRN: [id number]  HPI Pt here for follow up and management of chronic medical problems which includes hypothyroid and GERD. She is taking medications regularly.The patient is doing well overall and has no specific complaints. She needs a refill on her thyroid medicine which will be taken care of today. She also is due a chest x-ray and will get lab work today. She will also get her flu shot today. She brings in blood pressures from the outside all of these are excellent and they will be scanned into the record. The patient denies any chest pain or shortness of breath. She has no trouble with abdominal pain but does have additional reflux since going off of her PPI. She's been taking ranitidine 150 twice a day. She will try the ranitidine 300 twice a day and if she continues to have problems with reflux she will go back on her PPI. She does see the gastroenterologist about every 5 years and sometimes he does endoscopy's along with a colonoscopy. She denies any blood in the stool or black tarry bowel movements. She is passing her water without problems and had her GYN exam in March. She is due to get a chest x-ray today. She denies any fatigue or issues with lack of thyroid hormone.    Patient Active Problem List   Diagnosis Date Noted  . Menopausal and perimenopausal disorder 12/08/2014  . GERD (gastroesophageal reflux disease) 07/25/2013  . Hypothyroidism 07/25/2013   Outpatient Encounter Prescriptions as of 12/19/2015  Medication Sig  . calcium carbonate 1250 MG capsule Take 250 mg by mouth daily.   . Cholecalciferol (VITAMIN D) 2000 UNITS CAPS Take 1 capsule by mouth daily.  Marland Kitchen estradiol (ESTRACE) 0.1 MG/GM vaginal cream Place 1 Applicatorful vaginally 2 (two) times a week.  . ibandronate (BONIVA) 150 MG tablet Take 150 mg by mouth every 30 (thirty) days. Take in the morning with a full glass of water, on an empty  stomach, and do not take anything else by mouth or lie down for the next 30 min.  Earney Navy Bicarbonate (ZEGERID OTC PO) Take by mouth.  . ranitidine (ZANTAC) 150 MG tablet Take 150 mg by mouth at bedtime. PRN  . RESTASIS 0.05 % ophthalmic emulsion   . SYNTHROID 88 MCG tablet TAKE 1 TABLET (88 MCG TOTAL) BY MOUTH DAILY BEFORE BREAKFAST.   No facility-administered encounter medications on file as of 12/19/2015.       Review of Systems  Constitutional: Negative.   HENT: Negative.   Eyes: Negative.   Respiratory: Negative.   Cardiovascular: Negative.   Gastrointestinal: Negative.   Endocrine: Negative.   Genitourinary: Negative.   Musculoskeletal: Negative.   Skin: Negative.   Allergic/Immunologic: Negative.   Neurological: Negative.   Hematological: Negative.   Psychiatric/Behavioral: Negative.        Objective:   Physical Exam  Constitutional: She is oriented to person, place, and time. She appears well-developed and well-nourished. No distress.  Pleasant and alert  HENT:  Head: Normocephalic and atraumatic.  Right Ear: External ear normal.  Left Ear: External ear normal.  Nose: Nose normal.  Mouth/Throat: Oropharynx is clear and moist.  Eyes: Conjunctivae and EOM are normal. Pupils are equal, round, and reactive to light. Right eye exhibits no discharge. Left eye exhibits no discharge. No scleral icterus.  Neck: Normal range of motion. Neck supple. No thyromegaly present.  No bruits or  thyromegaly  Cardiovascular: Normal rate, regular rhythm, normal heart sounds and intact distal pulses.   No murmur heard. The heart has a regular rate and rhythm at 72/m  Pulmonary/Chest: Effort normal and breath sounds normal. No respiratory distress. She has no wheezes. She has no rales.  Abdominal: Soft. Bowel sounds are normal. She exhibits no mass. There is no tenderness. There is no rebound and no guarding.  No liver or spleen enlargement no abdominal tenderness no  epigastric tenderness.  Musculoskeletal: Normal range of motion. She exhibits no edema.  Lymphadenopathy:    She has no cervical adenopathy.  Neurological: She is alert and oriented to person, place, and time. She has normal reflexes. No cranial nerve deficit.  Skin: Skin is warm and dry. No rash noted.  Psychiatric: She has a normal mood and affect. Her behavior is normal. Judgment and thought content normal.  Nursing note and vitals reviewed.   BP 127/60 (BP Location: Left Arm)   Pulse 84   Temp 97.3 F (36.3 C) (Oral)   Ht _0  (1.575 m)   Wt 124 lb (56.2 kg)   BMI 22.68 kg/m   [facility] reading (PRIMARY) by  Dr. Brunilda Payor x-ray with results pending                                      Assessment & Plan:  1. Hypothyroidism, unspecified hypothyroidism type -Continue current treatment pending results of lab work - CBC with Differential/Platelet - Thyroid Panel With TSH - DG Chest 2 View; Future  2. Vitamin D deficiency -K[place]on current treatment pending results of lab work - CBC with Differential/Platelet - VITAMIN D 25 Hydroxy (Vit-D Deficiency, Fractures)  3. Gastroesophageal reflux disease, esophagitis presence not specified -The patient has tried ranitidine 150 mg twice daily and try to stay off of the PPI and has had some breakthrough reflux with this. She will up the dose of the ranitidine to 300 mg twice a day to see if this abates the symptoms that she has had. If it does she will calls back and we will call him of 300 mg of ranitidine for her to her drugstore. This should be taken twice a day before breakfast and supper. If the 300 mg does not work she will go back on her PPI. - CBC with Differential/Platelet - Hepatic function panel  4. Elevated blood pressure -Blood pressure is good today and all readings brought from home were excellent. - BMP8+EGFR - CBC with Differential/Platelet - Hepatic function panel - NMR, lipoprofile - DG Chest 2 View; Future  Meds  ordered this encounter  Medications  . ranitidine (ZANTAC) 150 MG tablet    Sig: Take 150 mg by mouth at bedtime. PRN  . levothyroxine (SYNTHROID) 88 MCG tablet    Sig: TAKE 1 TABLET (88 MCG TOTAL) BY MOUTH DAILY BEFORE BREAKFAST.    Dispense:  90 tablet    Refill:  3   Patient Instructions  Continue current medications. Continue good therapeutic lifestyle changes which include good diet and exercise. Fall precautions discussed with patient. If an FOBT was given today- please return it to our front desk. If you are over 63 years old - you may need Prevnar 33 or the adult Pneumonia vaccine.  **Flu shots are available--- please call and schedule a FLU-CLINIC appointment**  After your visit with [place] today you will receive a survey in the mail or online  from [employer] regarding your care with [place]. Please take a moment to fill this out. Your feedback is very important to [place] as you can help [place] better understand your patient needs as well as improve your experience and satisfaction. WE CARE ABOUT YOU!!!   Flu shot that you received today may make your arm sore Continue to drink plenty of fluids and stay well hydrated Do not use overhead fans As far as her reflux is concerned, try to ranitidine 150 mg or 300 mg twice daily before breakfast and supper if this does not settle your reflux down and go back on her PPI. If 300 mg twice a day ranitidine controls your reflux, call us and we will call in a prescription for 300 mg When your blood work is returned, be sure and take a copy of that report with you to your GYN visit    Arrie Senate MD

## 2015-12-19 NOTE — Patient Instructions (Addendum)
Continue current medications. Continue good therapeutic lifestyle changes which include good diet and exercise. Fall precautions discussed with patient. If an FOBT was given today- please return it to our front desk. If you are over 63 years old - you may need Prevnar 13 or the adult Pneumonia vaccine.  **Flu shots are available--- please call and schedule a FLU-CLINIC appointment**  After your visit with us today you will receive a survey in the mail or online from [employer] regarding your care with us. Please take a moment to fill this out. Your feedback is very important to us as you can help us better understand your patient needs as well as improve your experience and satisfaction. WE CARE ABOUT YOU!!!   Flu shot that you received today may make your arm sore Continue to drink plenty of fluids and stay well hydrated Do not use overhead fans As far as her reflux is concerned, try to ranitidine 150 mg or 300 mg twice daily before breakfast and supper if this does not settle your reflux down and go back on her PPI. If 300 mg twice a day ranitidine controls your reflux, call us and we will call in a prescription for 300 mg When your blood work is returned, be sure and take a copy of that report with you to your GYN visit

## 2015-12-20 LAB — NMR, LIPOPROFILE
CHOLESTEROL: 176 mg/dL (ref 100–199)
HDL Cholesterol by NMR: 57 mg/dL (ref >39)
HDL PARTICLE NUMBER: 35 umol/L (ref >=30.5)
LDL Particle Number: 1312 nmol/L — ABNORMAL HIGH (ref <1000)
LDL SIZE: 21.5 nm (ref >20.5)
LDL-C: 107 mg/dL — ABNORMAL HIGH (ref 0–99)
LP-IR SCORE: 27 (ref <=45)
Small LDL Particle Number: 357 nmol/L (ref <=527)
TRIGLYCERIDES BY NMR: 59 mg/dL (ref 0–149)

## 2015-12-20 LAB — CBC WITH DIFFERENTIAL/PLATELET
BASOS: 1 %
Basophils Absolute: 0.1 10*3/uL (ref 0.0–0.2)
EOS (ABSOLUTE): 0.1 10*3/uL (ref 0.0–0.4)
EOS: 2 %
HEMATOCRIT: 39.7 % (ref 34.0–46.6)
HEMOGLOBIN: 13.1 g/dL (ref 11.1–15.9)
IMMATURE GRANULOCYTES: 0 %
Immature Grans (Abs): 0 10*3/uL (ref 0.0–0.1)
LYMPHS ABS: 1.8 10*3/uL (ref 0.7–3.1)
Lymphs: 41 %
MCH: 31.6 pg (ref 26.6–33.0)
MCHC: 33 g/dL (ref 31.5–35.7)
MCV: 96 fL (ref 79–97)
MONOS ABS: 0.4 10*3/uL (ref 0.1–0.9)
Monocytes: 8 %
NEUTROS PCT: 48 %
Neutrophils Absolute: 2.2 10*3/uL (ref 1.4–7.0)
Platelets: 266 10*3/uL (ref 150–379)
RBC: 4.14 x10E6/uL (ref 3.77–5.28)
RDW: 13.2 % (ref 12.3–15.4)
WBC: 4.5 10*3/uL (ref 3.4–10.8)

## 2015-12-20 LAB — THYROID PANEL WITH TSH
Free Thyroxine Index: 2.7 (ref 1.2–4.9)
T3 UPTAKE RATIO: 26 % (ref 24–39)
T4, Total: 10.4 ug/dL (ref 4.5–12.0)
TSH: 1.37 u[IU]/mL (ref 0.450–4.500)

## 2015-12-20 LAB — VITAMIN D 25 HYDROXY (VIT D DEFICIENCY, FRACTURES): Vit D, 25-Hydroxy: 63.4 ng/mL (ref 30.0–100.0)

## 2015-12-20 LAB — HEPATIC FUNCTION PANEL
ALK PHOS: 49 IU/L (ref 39–117)
ALT: 25 IU/L (ref 0–32)
AST: 29 IU/L (ref 0–40)
Albumin: 4.6 g/dL (ref 3.6–4.8)
BILIRUBIN TOTAL: 0.9 mg/dL (ref 0.0–1.2)
Bilirubin, Direct: 0.19 mg/dL (ref 0.00–0.40)
Total Protein: 7.1 g/dL (ref 6.0–8.5)

## 2015-12-20 LAB — BMP8+EGFR
BUN/Creatinine Ratio: 22 (ref 12–28)
BUN: 15 mg/dL (ref 8–27)
CALCIUM: 9.1 mg/dL (ref 8.7–10.3)
CHLORIDE: 101 mmol/L (ref 96–106)
CO2: 25 mmol/L (ref 18–29)
Creatinine, Ser: 0.69 mg/dL (ref 0.57–1.00)
GFR calc Af Amer: 107 mL/min/{1.73_m2} (ref >59)
GFR calc non Af Amer: 93 mL/min/{1.73_m2} (ref >59)
Glucose: 75 mg/dL (ref 65–99)
POTASSIUM: 4.2 mmol/L (ref 3.5–5.2)
Sodium: 141 mmol/L (ref 134–144)

## 2016-01-29 ENCOUNTER — Encounter: Payer: Self-pay | Admitting: Family Medicine

## 2016-03-21 ENCOUNTER — Encounter: Payer: Self-pay | Admitting: Family

## 2016-03-21 ENCOUNTER — Ambulatory Visit (INDEPENDENT_AMBULATORY_CARE_PROVIDER_SITE_OTHER): Payer: 59 | Admitting: Family

## 2016-03-21 VITALS — BP 145/77 | HR 94 | Temp 97.2°F | Ht 62.0 in | Wt 124.0 lb

## 2016-03-21 DIAGNOSIS — N390 Urinary tract infection, site not specified: Secondary | ICD-10-CM | POA: Diagnosis not present

## 2016-03-21 DIAGNOSIS — R300 Dysuria: Secondary | ICD-10-CM | POA: Diagnosis not present

## 2016-03-21 LAB — URINALYSIS
Bilirubin, UA: NEGATIVE
Glucose, UA: NEGATIVE
Ketones, UA: NEGATIVE
LEUKOCYTES UA: NEGATIVE
NITRITE UA: NEGATIVE
PH UA: 7 (ref 5.0–7.5)
Protein, UA: NEGATIVE
RBC, UA: NEGATIVE
Specific Gravity, UA: <1.005 — ABNORMAL LOW (ref 1.005–1.030)
Urobilinogen, Ur: 0.2 mg/dL (ref 0.2–1.0)

## 2016-03-21 MED ORDER — SULFAMETHOXAZOLE-TRIMETHOPRIM 800-160 MG PO TABS: 1.0000 | ORAL_TABLET | Freq: Two times a day (BID) | ORAL | 0 refills | Status: DC

## 2016-03-21 NOTE — Progress Notes (Signed)
   Subjective:    Patient ID: Karla [name], female    DOB: May 27, 1950, 65 y.o.   MRN: [id number]  Dysuria   This is a new problem. The current episode started in the past 7 days. The problem occurs intermittently. The problem has been unchanged. The quality of the pain is described as aching. The pain is at a severity of 2/10. The pain is mild. Associated symptoms include frequency and urgency. Pertinent negatives include no flank pain, hematuria, nausea or vomiting. She has tried increased fluids for the symptoms. The treatment provided mild relief.      Review of Systems  Gastrointestinal: Negative for nausea and vomiting.  Genitourinary: Positive for dysuria, frequency and urgency. Negative for flank pain and hematuria.  All other systems reviewed and are negative.      Objective:   Physical Exam  Constitutional: She is oriented to person, place, and time. She appears well-developed and well-nourished. No distress.  Cardiovascular: Normal rate, regular rhythm, normal heart sounds and intact distal pulses.   No murmur heard. Pulmonary/Chest: Effort normal and breath sounds normal. No respiratory distress. She has no wheezes.  Abdominal: Soft. Bowel sounds are normal. She exhibits no distension. There is no tenderness.  Musculoskeletal: Normal range of motion. She exhibits no edema or tenderness.  Neurological: She is alert and oriented to person, place, and time.  Skin: Skin is warm and dry.  Psychiatric: She has a normal mood and affect. Her behavior is normal. Judgment and thought content normal.  Vitals reviewed.     BP (!) 145/77   Pulse 94   Temp 97.2 F (36.2 C) (Oral)   Ht 5\' 2"  (1.575 m)   Wt 124 lb (56.2 kg)   BMI 22.68 kg/m      Assessment & Plan:  1. Dysuria - Urinalysis - Urine culture  2. Urinary tract infection without hematuria, site unspecified Force fluids AZO over the counter X2 days RTO prn Culture pending -Will send in bactrim and if symptoms  worsen pt to go pick up rx. Will wait on culture.  RTO prn - sulfamethoxazole-trimethoprim (BACTRIM DS) 800-160 MG tablet; Take 1 tablet by mouth 2 (two) times daily.  Dispense: 14 tablet; Refill: 0  Jannifer Rodneyhristy Cay Kath, FNP

## 2016-03-21 NOTE — Patient Instructions (Signed)
Asymptomatic Bacteriuria, Female  Asymptomatic bacteriuria is the presence of a large number of bacteria in your urine without the usual symptoms of burning or frequent urination. The following conditions increase the risk of asymptomatic bacteriuria:   Diabetes mellitus.   Advanced age.   Pregnancy in the first trimester.   Kidney stones.   Kidney transplants.   Leaky kidney tube valve in young children (reflux).  Treatment for this condition is not needed in most people and can lead to other problems such as too much yeast and growth of resistant bacteria. However, some people, such as pregnant women, do need treatment to prevent kidney infection. Asymptomatic bacteriuria in pregnancy is also associated with fetal growth restriction, premature labor, and newborn death.  HOME CARE INSTRUCTIONS  Monitor your condition for any changes. The following actions may help to relieve any discomfort you are feeling:   Drink enough water and fluids to keep your urine clear or pale yellow. Go to the bathroom more often to keep your bladder empty.   Keep the area around your vagina and rectum clean. Wipe yourself from front to back after urinating.  SEEK IMMEDIATE MEDICAL CARE IF:   You develop signs of an infection such as:    Burning with urination.    Frequency of voiding.    Back pain.    Fever.   You have blood in the urine.   You develop a fever.  MAKE SURE YOU:   Understand these instructions.   Will watch your condition.   Will get help right away if you are not doing well or get worse.     This information is not intended to replace advice given to you by your health care provider. Make sure you discuss any questions you have with your health care provider.     Document Released: 03/10/2005 Document Revised: 03/31/2014 Document Reviewed: 08/30/2012  [employer]vier Interactive Patient Education 2017 [employer]

## 2016-03-23 LAB — URINE CULTURE

## 2016-03-24 ENCOUNTER — Encounter: Payer: Self-pay | Admitting: Family

## 2016-06-05 ENCOUNTER — Encounter: Payer: Self-pay | Admitting: Family Medicine

## 2016-06-06 ENCOUNTER — Other Ambulatory Visit: Payer: Self-pay | Admitting: Gynecology

## 2016-06-06 DIAGNOSIS — M8580 Other specified disorders of bone density and structure, unspecified site: Secondary | ICD-10-CM

## 2016-06-16 ENCOUNTER — Other Ambulatory Visit: Payer: Self-pay | Admitting: Family Medicine

## 2016-06-16 ENCOUNTER — Other Ambulatory Visit: Payer: Self-pay | Admitting: Gynecology

## 2016-06-16 DIAGNOSIS — Z1231 Encounter for screening mammogram for malignant neoplasm of breast: Secondary | ICD-10-CM

## 2016-06-18 ENCOUNTER — Ambulatory Visit: Payer: 59 | Admitting: Family Medicine

## 2016-07-07 ENCOUNTER — Ambulatory Visit (INDEPENDENT_AMBULATORY_CARE_PROVIDER_SITE_OTHER): Payer: 59 | Admitting: Nurse Practitioner

## 2016-07-07 ENCOUNTER — Encounter: Payer: Self-pay | Admitting: Nurse Practitioner

## 2016-07-07 VITALS — BP 142/72 | HR 108 | Temp 98.5°F | Ht 62.0 in | Wt 124.0 lb

## 2016-07-07 DIAGNOSIS — R05 Cough: Secondary | ICD-10-CM | POA: Diagnosis not present

## 2016-07-07 DIAGNOSIS — R059 Cough, unspecified: Secondary | ICD-10-CM

## 2016-07-07 DIAGNOSIS — J301 Allergic rhinitis due to pollen: Secondary | ICD-10-CM

## 2016-07-07 MED ORDER — FLUTICASONE PROPIONATE 50 MCG/ACT NA SUSP: 2.0000 | Freq: Every day | NASAL | 6 refills | Status: DC

## 2016-07-07 MED ORDER — CETIRIZINE HCL 10 MG PO TABS: 10.0000 mg | ORAL_TABLET | Freq: Every day | ORAL | 11 refills | Status: DC

## 2016-07-07 NOTE — Progress Notes (Signed)
   Subjective:    Patient ID: Karla [name], female    DOB: Aug 20, 1950, 66 y.o.   MRN: [id number]  HPI Patient comes in alone today with c/o head ache, scratchy throat and congestion. Started 2 days ago.    Review of Systems  Constitutional: Negative.  Negative for appetite change, chills and fever.  HENT: Positive for congestion, postnasal drip, rhinorrhea and sneezing. Negative for ear pain, sinus pressure, sore throat and trouble swallowing.   Respiratory: Positive for cough (slight).   Cardiovascular: Negative.   Gastrointestinal: Negative.   Genitourinary: Negative.   Neurological: Negative.   Psychiatric/Behavioral: Negative.   All other systems reviewed and are negative.      Objective:   Physical Exam  Constitutional: She is oriented to person, place, and time. She appears well-developed and well-nourished. No distress.  HENT:  Right Ear: Hearing, tympanic membrane, external ear and ear canal normal.  Left Ear: Hearing, tympanic membrane, external ear and ear canal normal.  Nose: Mucosal edema and rhinorrhea present. Right sinus exhibits no maxillary sinus tenderness and no frontal sinus tenderness. Left sinus exhibits no maxillary sinus tenderness and no frontal sinus tenderness.  Mouth/Throat: Uvula is midline, oropharynx is clear and moist and mucous membranes are normal.  Cardiovascular: Normal rate and regular rhythm.   Pulmonary/Chest: Effort normal.  Neurological: She is alert and oriented to person, place, and time.  Skin: Skin is warm.  Psychiatric: She has a normal mood and affect. Her behavior is normal. Judgment and thought content normal.   BP (!) 142/72   Pulse (!) 108   Temp 98.5 F (36.9 C) (Oral)   Ht  (1.575 m)   Wt 124 lb (56.2 kg)   BMI 22.68 kg/m       Assessment & Plan:  1. Seasonal allergic rhinitis due to pollen Avoid allergens - fluticasone (FLONASE) 50 MCG/ACT nasal spray; Place 2 sprays into both nostrils daily.  Dispense: 16 g;  Refill: 6 - cetirizine (ZYRTEC) 10 MG tablet; Take 1 tablet (10 mg total) by mouth daily.  Dispense: 30 tablet; Refill: 11  2. Cough Continue to force fluids mucinex OTC RTO if no better  Mary-Margaret Daphine Deutscher, FNP

## 2016-07-07 NOTE — Patient Instructions (Signed)
Allergic Rhinitis Allergic rhinitis is when the mucous membranes in the nose respond to allergens. Allergens are particles in the air that cause your body to have an allergic reaction. This causes you to release allergic antibodies. Through a chain of events, these eventually cause you to release histamine into the blood stream. Although meant to protect the body, it is this release of histamine that causes your discomfort, such as frequent sneezing, congestion, and an itchy, runny nose. What are the causes? Seasonal allergic rhinitis (hay fever) is caused by pollen allergens that may come from grasses, trees, and weeds. Year-round allergic rhinitis (perennial allergic rhinitis) is caused by allergens such as house dust mites, pet dander, and mold spores. What are the signs or symptoms?  Nasal stuffiness (congestion).  Itchy, runny nose with sneezing and tearing of the eyes. How is this diagnosed? Your health care provider can help you determine the allergen or allergens that trigger your symptoms. If you and your health care provider are unable to determine the allergen, skin or blood testing may be used. Your health care provider will diagnose your condition after taking your health history and performing a physical exam. Your health care provider may assess you for other related conditions, such as asthma, pink eye, or an ear infection. How is this treated? Allergic rhinitis does not have a cure, but it can be controlled by:  Medicines that block allergy symptoms. These may include allergy shots, nasal sprays, and oral antihistamines.  Avoiding the allergen. Hay fever may often be treated with antihistamines in pill or nasal spray forms. Antihistamines block the effects of histamine. There are over-the-counter medicines that may help with nasal congestion and swelling around the eyes. Check with your health care provider before taking or giving this medicine. If avoiding the allergen or the  medicine prescribed do not work, there are many new medicines your health care provider can prescribe. Stronger medicine may be used if initial measures are ineffective. Desensitizing injections can be used if medicine and avoidance does not work. Desensitization is when a patient is given ongoing shots until the body becomes less sensitive to the allergen. Make sure you follow up with your health care provider if problems continue. Follow these instructions at home: It is not possible to completely avoid allergens, but you can reduce your symptoms by taking steps to limit your exposure to them. It helps to know exactly what you are allergic to so that you can avoid your specific triggers. Contact a health care provider if:  You have a fever.  You develop a cough that does not stop easily (persistent).  You have shortness of breath.  You start wheezing.  Symptoms interfere with normal daily activities. This information is not intended to replace advice given to you by your health care provider. Make sure you discuss any questions you have with your health care provider. Document Released: 12/03/2000 Document Revised: 11/09/2015 Document Reviewed: 11/15/2012 Elsevier Interactive Patient Education  2017 Elsevier Inc.  

## 2016-07-14 ENCOUNTER — Ambulatory Visit
Admission: RE | Admit: 2016-07-14 | Discharge: 2016-07-14 | Disposition: A | Discharge status: Home / Self Care | Payer: 59 | Source: Ambulatory Visit | Attending: Gynecology | Admitting: Gynecology

## 2016-07-14 DIAGNOSIS — M8580 Other specified disorders of bone density and structure, unspecified site: Secondary | ICD-10-CM

## 2016-07-14 DIAGNOSIS — Z1231 Encounter for screening mammogram for malignant neoplasm of breast: Secondary | ICD-10-CM

## 2016-07-21 ENCOUNTER — Other Ambulatory Visit: Payer: Self-pay | Admitting: *Deleted

## 2016-07-21 ENCOUNTER — Telehealth: Payer: Self-pay | Admitting: Family Medicine

## 2016-07-21 DIAGNOSIS — K219 Gastro-esophageal reflux disease without esophagitis: Secondary | ICD-10-CM

## 2016-07-21 DIAGNOSIS — E7800 Pure hypercholesterolemia, unspecified: Secondary | ICD-10-CM

## 2016-07-21 DIAGNOSIS — N959 Unspecified menopausal and perimenopausal disorder: Secondary | ICD-10-CM

## 2016-07-21 NOTE — Telephone Encounter (Signed)
Lab order entered in Epic

## 2016-07-22 ENCOUNTER — Other Ambulatory Visit: Payer: Self-pay | Admitting: *Deleted

## 2016-07-22 DIAGNOSIS — Z0000 Encounter for general adult medical examination without abnormal findings: Secondary | ICD-10-CM

## 2016-07-22 DIAGNOSIS — E039 Hypothyroidism, unspecified: Secondary | ICD-10-CM

## 2016-07-22 DIAGNOSIS — E559 Vitamin D deficiency, unspecified: Secondary | ICD-10-CM

## 2016-07-22 DIAGNOSIS — K219 Gastro-esophageal reflux disease without esophagitis: Secondary | ICD-10-CM

## 2016-07-28 ENCOUNTER — Other Ambulatory Visit: Payer: 59

## 2016-07-28 DIAGNOSIS — E039 Hypothyroidism, unspecified: Secondary | ICD-10-CM

## 2016-07-28 DIAGNOSIS — Z0000 Encounter for general adult medical examination without abnormal findings: Secondary | ICD-10-CM

## 2016-07-28 DIAGNOSIS — E559 Vitamin D deficiency, unspecified: Secondary | ICD-10-CM

## 2016-07-28 DIAGNOSIS — K219 Gastro-esophageal reflux disease without esophagitis: Secondary | ICD-10-CM

## 2016-07-29 ENCOUNTER — Ambulatory Visit: Payer: 59 | Admitting: Family Medicine

## 2016-07-29 LAB — CBC WITH DIFFERENTIAL/PLATELET
BASOS: 2 %
Basophils Absolute: 0.1 10*3/uL (ref 0.0–0.2)
EOS (ABSOLUTE): 0.1 10*3/uL (ref 0.0–0.4)
Eos: 2 %
HEMOGLOBIN: 12.3 g/dL (ref 11.1–15.9)
Hematocrit: 37.5 % (ref 34.0–46.6)
IMMATURE GRANS (ABS): 0 10*3/uL (ref 0.0–0.1)
IMMATURE GRANULOCYTES: 0 %
LYMPHS: 47 %
Lymphocytes Absolute: 2.1 10*3/uL (ref 0.7–3.1)
MCH: 32.1 pg (ref 26.6–33.0)
MCHC: 32.8 g/dL (ref 31.5–35.7)
MCV: 98 fL — ABNORMAL HIGH (ref 79–97)
MONOCYTES: 9 %
Monocytes Absolute: 0.4 10*3/uL (ref 0.1–0.9)
NEUTROS ABS: 1.8 10*3/uL (ref 1.4–7.0)
NEUTROS PCT: 40 %
PLATELETS: 309 10*3/uL (ref 150–379)
RBC: 3.83 x10E6/uL (ref 3.77–5.28)
RDW: 13 % (ref 12.3–15.4)
WBC: 4.4 10*3/uL (ref 3.4–10.8)

## 2016-07-29 LAB — HEPATIC FUNCTION PANEL
ALK PHOS: 56 IU/L (ref 39–117)
ALT: 21 IU/L (ref 0–32)
AST: 22 IU/L (ref 0–40)
Albumin: 4.5 g/dL (ref 3.6–4.8)
BILIRUBIN TOTAL: 1.1 mg/dL (ref 0.0–1.2)
BILIRUBIN, DIRECT: 0.23 mg/dL (ref 0.00–0.40)
Total Protein: 7 g/dL (ref 6.0–8.5)

## 2016-07-29 LAB — LIPID PANEL
Chol/HDL Ratio: 3.5 ratio (ref 0.0–4.4)
Cholesterol, Total: 179 mg/dL (ref 100–199)
HDL: 51 mg/dL (ref >39)
LDL Calculated: 114 mg/dL — ABNORMAL HIGH (ref 0–99)
TRIGLYCERIDES: 68 mg/dL (ref 0–149)
VLDL CHOLESTEROL CAL: 14 mg/dL (ref 5–40)

## 2016-07-29 LAB — BMP8+EGFR
BUN / CREAT RATIO: 22 (ref 12–28)
BUN: 13 mg/dL (ref 8–27)
CALCIUM: 9 mg/dL (ref 8.7–10.3)
CHLORIDE: 103 mmol/L (ref 96–106)
CO2: 28 mmol/L (ref 18–29)
CREATININE: 0.6 mg/dL (ref 0.57–1.00)
GFR calc non Af Amer: 97 mL/min/{1.73_m2} (ref >59)
GFR, EST AFRICAN AMERICAN: 112 mL/min/{1.73_m2} (ref >59)
Glucose: 73 mg/dL (ref 65–99)
Potassium: 3.8 mmol/L (ref 3.5–5.2)
Sodium: 144 mmol/L (ref 134–144)

## 2016-07-29 LAB — THYROID PANEL WITH TSH
FREE THYROXINE INDEX: 3.5 (ref 1.2–4.9)
T3 UPTAKE RATIO: 24 % (ref 24–39)
T4, Total: 14.4 ug/dL — ABNORMAL HIGH (ref 4.5–12.0)
TSH: 1 u[IU]/mL (ref 0.450–4.500)

## 2016-07-29 LAB — VITAMIN D 25 HYDROXY (VIT D DEFICIENCY, FRACTURES): Vit D, 25-Hydroxy: 48.1 ng/mL (ref 30.0–100.0)

## 2016-07-31 ENCOUNTER — Ambulatory Visit (INDEPENDENT_AMBULATORY_CARE_PROVIDER_SITE_OTHER): Payer: 59 | Admitting: Family Medicine

## 2016-07-31 ENCOUNTER — Encounter: Payer: Self-pay | Admitting: Family Medicine

## 2016-07-31 VITALS — BP 123/66 | HR 86 | Temp 97.3°F | Ht 62.0 in | Wt 128.0 lb

## 2016-07-31 DIAGNOSIS — E7800 Pure hypercholesterolemia, unspecified: Secondary | ICD-10-CM | POA: Diagnosis not present

## 2016-07-31 DIAGNOSIS — K589 Irritable bowel syndrome without diarrhea: Secondary | ICD-10-CM

## 2016-07-31 DIAGNOSIS — K219 Gastro-esophageal reflux disease without esophagitis: Secondary | ICD-10-CM | POA: Diagnosis not present

## 2016-07-31 DIAGNOSIS — E034 Atrophy of thyroid (acquired): Secondary | ICD-10-CM | POA: Diagnosis not present

## 2016-07-31 DIAGNOSIS — E559 Vitamin D deficiency, unspecified: Secondary | ICD-10-CM

## 2016-07-31 NOTE — Patient Instructions (Addendum)
Continue current medications. Continue good therapeutic lifestyle changes which include good diet and exercise. Fall precautions discussed with patient. If an FOBT was given today- please return it to our front desk. If you are over 64 years old - you may need Prevnar 13 or the adult Pneumonia vaccine.  **Flu shots are available--- please call and schedule a FLU-CLINIC appointment**  After your visit with us today you will receive a survey in the mail or online from [employer] regarding your care with us. Please take a moment to fill this out. Your feedback is very important to us as you can help us better understand your patient needs as well as improve your experience and satisfaction. WE CARE ABOUT YOU!!!  Continue with more aggressive diet efforts Drink more water and try Lacroix Reduce milk and dairy products as much as possible Continue to avoid caffeine Take probiotic like align and take daily. Get colonoscopy as planned

## 2016-07-31 NOTE — Progress Notes (Signed)
Subjective:    Patient ID: Karla [name], female    DOB: [birth year], 66 y.o.   MRN: [id number]  HPI  Pt is here for follow up of chronic medical problems which include hypothyroid, GERD, and osteopenia.The patient brings in blood pressures for review and overall these are good and well within the normal range. Her medications were also reviewed. She is currently taking omeprazole and not ranitidine. She is also on thyroid replacement 88 g daily. She is up-to-date on her health maintenance issues. Her last Pap smear and pelvic exam were done in April 2017. Her last [facility]. She is up-to-date on her eye exam with deep be eyes associated. She had her pneumococcal vaccine in 2014. She has also had a hepatitis C antibody test that was negative. The patient has apparently never had a colonoscopy and we will look into that further. She will be given an FOBT to return. On her recent lab work, her lipid panel and an LDL C that was elevated this time at 114. Her good cholesterol remains good and the triglycerides were good. The CBC was within normal limits with a good hemoglobin normal white blood cell count and adequate platelet count. Blood Sugar renal and electrolytes were all within normal limits. All liver function tests were within normal limits. The vitamin D level was good at 48.1 and will have her continue with her current treatment. The TSH are the most important thyroid test was within normal limits. She will continue with current treatment. The patient denies any chest pain or shortness of breath. She is back on a proton pump inhibitor as she did not tolerate the ranitidine as well. She denies now any heartburn indigestion nausea vomiting diarrhea or blood in the stool. She gets her colonoscopies every 5 years by Dr. Kinnie Scales. She has one coming up very soon and had the last one about 5 years ago. She denies any trouble with passing her water. She gets her DEXA scan  done and this is actually improved somewhat with being on Boniva.      Patient Active Problem List   Diagnosis Date Noted  . Menopausal and perimenopausal disorder 12/08/2014  . GERD (gastroesophageal reflux disease) 07/25/2013  . Hypothyroidism 07/25/2013   Outpatient Encounter Prescriptions as of 07/31/2016  Medication Sig  . calcium carbonate 1250 MG capsule Take 250 mg by mouth daily.   . Cholecalciferol (VITAMIN D) 2000 UNITS CAPS Take 1 capsule by mouth daily.  Marland Kitchen estradiol (ESTRACE) 0.1 MG/GM vaginal cream Place 1 Applicatorful vaginally 2 (two) times a week.  . ibandronate (BONIVA) 150 MG tablet Take 150 mg by mouth every 30 (thirty) days. Take in the morning with a full glass of water, on an empty stomach, and do not take anything else by mouth or lie down for the next 30 min.  Marland Kitchen levothyroxine (SYNTHROID) 88 MCG tablet Take 1 tablet (88 mcg total) by mouth daily before breakfast.  . Omeprazole-Sodium Bicarbonate (ZEGERID OTC PO) Take by mouth.  . RESTASIS 0.05 % ophthalmic emulsion   . [DISCONTINUED] cetirizine (ZYRTEC) 10 MG tablet Take 1 tablet (10 mg total) by mouth daily. (Patient not taking: Reported on 07/31/2016)  . [DISCONTINUED] fluticasone (FLONASE) 50 MCG/ACT nasal spray Place 2 sprays into both nostrils daily. (Patient not taking: Reported on 07/31/2016)  . [DISCONTINUED] ranitidine (ZANTAC) 150 MG tablet Take 150 mg by mouth at bedtime. PRN   No facility-administered encounter medications on file as of 07/31/2016.  Review of Systems  Constitutional: Negative.   HENT: Negative.   Eyes: Negative.   Respiratory: Negative.   Gastrointestinal: Positive for abdominal pain (lower, intermitent) and constipation (slight).  Genitourinary: Negative.   Musculoskeletal: Negative.   Allergic/Immunologic: Negative for environmental allergies.  Neurological: Negative.   Psychiatric/Behavioral: Negative.        Objective:   Physical Exam  Constitutional: She is  oriented to person, place, and time. She appears well-developed and well-nourished. No distress.  Pleasant and alert  HENT:  Head: Normocephalic and atraumatic.  Right Ear: External ear normal.  Left Ear: External ear normal.  Nose: Nose normal.  Mouth/Throat: Oropharynx is clear and moist. No oropharyngeal exudate.  Eyes: Conjunctivae and EOM are normal. Pupils are equal, round, and reactive to light. Right eye exhibits no discharge. Left eye exhibits no discharge. No scleral icterus.  Neck: Normal range of motion. Neck supple. No thyromegaly present.  No bruits thyromegaly or anterior cervical adenopathy  Cardiovascular: Normal rate, regular rhythm, normal heart sounds and intact distal pulses.   No murmur heard. The heart is regular at 72/m  Pulmonary/Chest: Effort normal and breath sounds normal. No respiratory distress. She has no wheezes. She has no rales. She exhibits no tenderness.  Clear anteriorly and posteriorly  Abdominal: Soft. Bowel sounds are normal. She exhibits no mass. There is no tenderness. There is no rebound and no guarding.  No liver or spleen enlargement. No epigastric tenderness. No bruits or masses.  Musculoskeletal: Normal range of motion. She exhibits no edema.  Lymphadenopathy:    She has no cervical adenopathy.  Neurological: She is alert and oriented to person, place, and time. She has normal reflexes. No cranial nerve deficit.  Skin: Skin is warm and dry. No rash noted.  Psychiatric: She has a normal mood and affect. Her behavior is normal. Judgment and thought content normal.  Nursing note and vitals reviewed.  BP 123/66 (BP Location: Left Arm, Patient Position: Sitting, Cuff Size: Normal)   Pulse 86   Temp 97.3 F (36.3 C) (Oral)   Ht 5\' 2"  (1.575 m)   Wt 128 lb (58.1 kg)   BMI 23.41 kg/m        Assessment & Plan:  1. Vitamin D deficiency -Continue current vitamin D replacement  2. Gastroesophageal reflux disease, esophagitis presence not  specified -Patient tried ranitidine but had to go back on proton pump inhibitor to take care of reflux. -Patient plans to get her repeat colonoscopy soon which would be 5 years since the previous colonoscopy. She gets this with Dr. Kinnie ScalesMedoff.  3. Hypothyroidism due to acquired atrophy of thyroid -Continue 88 g 1 daily recheck again in 6 months  4. Irritable bowel syndrome without diarrhea -Take probiotic. Reduce milk and dairy products in the diet. Eat more fish.  5. Pure hypercholesterolemia -The LDL C was elevated and the patient will do better with her meds potatoes. She will try to eat more fish that is baked and broiled and less sugar. -We will recheck the cholesterol at the next visit and then make a decision about any other control efforts we would need to do at that time.  No orders of the defined types were placed in this encounter.  Patient Instructions  Continue current medications. Continue good therapeutic lifestyle changes which include good diet and exercise. Fall precautions discussed with patient. If an FOBT was given today- please return it to our front desk. If you are over 64 years old - you may need Prevnar  13 or the adult Pneumonia vaccine.  **Flu shots are available--- please call and schedule a FLU-CLINIC appointment**  After your visit with [place] today you will receive a survey in the mail or online from [employer] regarding your care with [place]. Please take a moment to fill this out. Your feedback is very important to [place] as you can help [place] better understand your patient needs as well as improve your experience and satisfaction. WE CARE ABOUT YOU!!!  Continue with more aggressive diet efforts Drink more water and try Lacroix Reduce milk and dairy products as much as possible Continue to avoid caffeine Take probiotic like align and take daily. Get colonoscopy as planned   Nyra Capes MD

## 2016-09-15 DIAGNOSIS — M810 Age-related osteoporosis without current pathological fracture: Secondary | ICD-10-CM | POA: Insufficient documentation

## 2016-12-01 ENCOUNTER — Encounter: Payer: Self-pay | Admitting: Family Medicine

## 2016-12-01 ENCOUNTER — Ambulatory Visit (INDEPENDENT_AMBULATORY_CARE_PROVIDER_SITE_OTHER): Payer: 59 | Admitting: Family Medicine

## 2016-12-01 VITALS — BP 139/82 | HR 120 | Temp 100.5°F | Ht 62.0 in | Wt 125.0 lb

## 2016-12-01 DIAGNOSIS — J029 Acute pharyngitis, unspecified: Secondary | ICD-10-CM

## 2016-12-01 DIAGNOSIS — J069 Acute upper respiratory infection, unspecified: Secondary | ICD-10-CM

## 2016-12-01 LAB — VERITOR FLU A/B WAIVED
INFLUENZA B: NEGATIVE
Influenza A: NEGATIVE

## 2016-12-01 LAB — RAPID STREP SCREEN (MED CTR MEBANE ONLY): Strep Gp A Ag, IA W/Reflex: NEGATIVE

## 2016-12-01 LAB — CULTURE, GROUP A STREP

## 2016-12-01 NOTE — Progress Notes (Signed)
Subjective: CC: sore throat PCP: Ernestina PennaMoore, Donald W, MD UJW:[id number]:Karla [name] is a 66 y.o. female presenting to clinic today for:  1. Sore throat Patient reports acute onset of sore throat and myalgias sometime in the middle the night Sunday morning. She denies sick contacts, recent travel. She reports a fever at home to 102.3F. She reports she's been taking Tylenol for symptoms with good relief of fever. She has been pushing fluids at home but notes that she's had a poor appetite. She reports associated headache and chills, which have resolved. Denies nausea, vomiting, rhinorrhea, sinus congestion, chest congestion, shortness of breath, cough, hemoptysis. No personal history of asthma or COPD.  Surgical history significant for tonsillectomy at age of 89.  No Known Allergies Past Medical History:  Diagnosis Date  . GERD (gastroesophageal reflux disease)   . Thyroid disease    Family History  Problem Relation Age of Onset  . Hypertension Mother   . Hyperlipidemia Mother   . Hypertension Father    Social Hx: non smoker.Current medications reviewed.   ROS: Per HPI  Objective: Office vital signs reviewed. BP 139/82   Pulse (!) 120   Temp (!) 100.5 F (38.1 C) (Oral)   Ht 5\' 2"  (1.575 m)   Wt 125 lb (56.7 kg)   BMI 22.86 kg/m   Physical Examination:  General: Awake, alert, well nourished, No acute distress HEENT: Normal    Neck: No masses palpated. No lymphadenopathy    Ears: Tympanic membranes intact, normal light reflex, no erythema, no bulging    Eyes: PERRLA, extraocular membranes intact, sclera white    Nose: nasal turbinates moist, mild clear nasal discharge    Throat: moist mucus membranes, moderate o/p erythema, tonsils surgically absent.  Airway is patent Cardio: repeat HR 99, regular rhythm, S1S2 heard, no murmurs appreciated Pulm: clear to auscultation bilaterally, no wheezes, rhonchi or rales; normal work of breathing on room air Extremities: warm, well perfused, No  edema, cyanosis or clubbing; +2 pulses bilaterally MSK: normal gait and normal station Skin: dry; intact; no rashes or lesions Neuro: No focal deficits  No results found for this or any previous visit (from the past 24 hour(s)).   Assessment/ Plan: 66 y.o. female   Viral URI Concerning for possible influenza given the abrupt onset and constellation of symptoms. She is febrile here in office despite having taken Tylenol around 8 AM this morning.  I suspect that the mild tachycardia she has on exam is related to fever. She appears well-hydrated, nontoxic.  Rapid flu negative.  She likely has a viral URI. We discussed that this can take up to 2 weeks to fully resolve. I encouraged oral hydration. May use Tylenol or NSAID when necessary sore throat, myalgia, headache.  Return precautions were reviewed including prolonged symptoms outside of normal. I advised her to call the office should this happen, as this would be an indication for empiric antibiotics for ABS. Could return precautions were reviewed. Patient voiced good understanding. - Veritor Flu A/B Waived  Sore throat Centor score 1.Strep throat unlikely.  However, sample was already obtained prior to seeing patient. Rapid strep was negative.  No culture obtained. - [facility] st[facility] screen ([facility])  Follow up as needed with primary care provider.  Orders Placed This Encounter  Procedures  . Rapid strep screen [facility])  . Veritor Flu A/B Waived    Order Specific Question:   Source    Answer:   nasal    Rozell SearingAshly M  Lajuana Ripple, [place] [phone]

## 2016-12-01 NOTE — Patient Instructions (Signed)
It appears that you have a viral upper respiratory infection (cold).  Cold symptoms can last up to 2 weeks.    - Get plenty of rest and drink plenty of fluids. - Try to breathe moist air. Use a humidifier or take a steamy shower. - Consume warm fluids (soup or tea) to provide relief for a stuffy nose and to loosen phlegm. - For nasal stuffiness, try saline nasal spray or a Neti Pot. - For sore throat pain relief: suck on throat lozenges, hard candy or popsicles; gargle with warm salt water (1/4 tsp. salt per 8 oz. of water); and eat soft, bland foods. - Eat a well-balanced diet. If you cannot, ensure you are getting enough nutrients by taking a daily multivitamin. - Avoid dairy products, as they can thicken phlegm. - Avoid alcohol, as it impairs your body's immune system.  CONTACT YOUR DOCTOR IF YOU EXPERIENCE ANY OF THE FOLLOWING: - High fever - Ear pain - Sinus-type headache - Unusually severe cold symptoms - Cough that gets worse while other cold symptoms improve - Flare up of any chronic lung problem, such as asthma - Your symptoms persist longer than 2 weeks    

## 2016-12-03 ENCOUNTER — Ambulatory Visit (INDEPENDENT_AMBULATORY_CARE_PROVIDER_SITE_OTHER): Payer: 59 | Admitting: Family Medicine

## 2016-12-03 ENCOUNTER — Encounter: Payer: Self-pay | Admitting: Family Medicine

## 2016-12-03 VITALS — BP 140/79 | HR 80 | Temp 97.5°F | Ht 62.0 in | Wt 125.0 lb

## 2016-12-03 DIAGNOSIS — R51 Headache: Secondary | ICD-10-CM

## 2016-12-03 DIAGNOSIS — J069 Acute upper respiratory infection, unspecified: Secondary | ICD-10-CM

## 2016-12-03 DIAGNOSIS — J029 Acute pharyngitis, unspecified: Secondary | ICD-10-CM

## 2016-12-03 DIAGNOSIS — R519 Headache, unspecified: Secondary | ICD-10-CM

## 2016-12-03 MED ORDER — AZITHROMYCIN 250 MG PO TABS: ORAL_TABLET | ORAL | 0 refills | Status: DC

## 2016-12-03 MED ORDER — LIDOCAINE VISCOUS 2 % MT SOLN: OROMUCOSAL | 0 refills | Status: DC

## 2016-12-03 NOTE — Progress Notes (Signed)
Subjective: CC: sore throat, headache PCP: Ernestina PennaMoore, Donald W, MD ZOX:[id number]:Karla [name] is a 66 y.o. female presenting to clinic today for:  1. Sore throat/ headache Patient was seen 2 days ago in clinic for sore throat and headache. She had a rapid flu and rapid strep which were negative. She was diagnosed with viral URI. She was discharged in stable condition and advised to continue supportive care at home. She notes that symptoms are persistent, unrelieved by oral Tylenol and at home treatments. She is concerned that she may need an antibiotic, citing that she plans to go on vacation next week. She reports persistent fever to 99.32F. Last tylenol 8am.  She reports poor appetite. Fluid intake has been good. She has been having some neck pain. She notes that the neck pain preceded current illness by several weeks.  Additionally, she notes that she has a h/o herniated disc in her neck that required surgery several years ago.  Denies visual disturbance, balance issues, numbness tingling, weakness, nausea, vomiting. Endorses mild intermittent cough that had mucus production with slight discoloration. No hemoptysis, shortness of breath, chest pain.  No Known Allergies Past Medical History:  Diagnosis Date  . GERD (gastroesophageal reflux disease)   . Thyroid disease    Family History  Problem Relation Age of Onset  . Hypertension Mother   . Hyperlipidemia Mother   . Hypertension Father    Social Hx: non smoker.Current medications reviewed.   ROS: Per HPI  Objective: Office vital signs reviewed. BP 140/79   Pulse 80   Temp (!) 97.5 F (36.4 C) (Oral)   Ht 5\' 2"  (1.575 m)   Wt 125 lb (56.7 kg)   BMI 22.86 kg/m   Physical Examination:  General: Awake, alert, well nourished, nontoxic appearing, No acute distress HEENT: Normal    Neck: No masses palpated. No lymphadenopathy; negative Kernig, negative Brudzinski, no TTP to spine    Ears: Tympanic membranes intact, normal light reflex, no  erythema, no bulging    Eyes: PERRLA, extraocular membranes intact, sclera white    Nose: nasal turbinates moist, clear nasal discharge    Throat: moist mucus membranes, no erythema, tonsils surgically absent.  Airway is patent Cardio: regular rate and rhythm, S1S2 heard, no murmurs appreciated Pulm: clear to auscultation bilaterally, no wheezes, rhonchi or rales; normal work of breathing on room air Extremities: warm, well perfused, No edema, cyanosis or clubbing; +2 pulses bilaterally MSK: normal gait and normal station Neuro: no focal neurologic deficits Psych: pleasant, cheerful  Assessment/ Plan: 66 y.o. female   1. Upper respiratory infection, acute I still suspect that this is a viral upper respiratory infection. Patient actually appears better than previous exam 2 days ago. She is afebrile with stable vital signs. She is certainly nontoxic appearing. No evidence to support acute bacterial infection. Because she is going out of town next week, I have provided her a pocket prescription for a Z-Pak. We discussed proper use of this medication. I have also prescribed her viscous lidocaine for pharyngitis. Continue Tylenol as needed for headache. Strict return precautions were reviewed with the patient. She voiced good understanding. Follow up as needed.  2. Acute nonintractable headache, unspecified headache type No focal neurologic deficits on exam. Headache is likely sinus in nature. She also has a history of chronic neck pain secondary to degenerative changes/herniated disc. She had surgical repair of this several years ago. There is nothing on exam to suggest meningitis or encephalitis.  3. Viral pharyngitis Patient to swish  and spit/swallow one tablespoon every 6 hours as needed for sore throat/oral pain. Encourage by mouth hydration.   No orders of the defined types were placed in this encounter.  Meds ordered this encounter  Medications  . azithromycin (ZITHROMAX Z-PAK) 250 MG  tablet    Sig: Take  PO day 1, then  days 2-5    Dispense:  6 tablet    Refill:  0  . lidocaine (XYLOCAINE) 2 % solution    Sig: Swish and spit/ swallow 15cc every 6 hours as needed for sore throat/ oral pain.    Dispense:  100 mL    Refill:  0     Ashly Hulen Skains, DO [facility] [phone]

## 2016-12-03 NOTE — Patient Instructions (Signed)
I'm so sorry to hear that her symptoms are persistent. As we discussed viral upper respiratory symptoms, present for up to 2 weeks. I do understand that you are planning a trip soon and therefore, I provided your pocket prescription to use if you have persistent or worsening symptoms. I also prescribed you something called viscous lidocaine. You will swish and spit/swallow one tablespoonful (15 mL) every 6 hours as needed for sore throat. Continue oral hydration and Tylenol as needed for headache, pain, fever. Again, if your symptoms worsen, you developed purulence from your nose, you cough up blood, have shortness of breath, nausea, vomiting or unable to stay hydrated please seek immediate medical attention. Otherwise I expect your symptoms to resolve in roughly 10 days.  It appears that you have a viral upper respiratory infection (cold).  Cold symptoms can last up to 2 weeks.    - Get plenty of rest and drink plenty of fluids. - Try to breathe moist air. Use a humidifier or take a steamy shower. - Consume warm fluids (soup or tea) to provide relief for a stuffy nose and to loosen phlegm. - For nasal stuffiness, try saline nasal spray or a Neti Pot. - For sore throat pain relief: suck on throat lozenges, hard candy or popsicles; gargle with warm salt water (1/4 tsp. salt per 8 oz. of water); and eat soft, bland foods. - Eat a well-balanced diet. If you cannot, ensure you are getting enough nutrients by taking a daily multivitamin. - Avoid dairy products, as they can thicken phlegm. - Avoid alcohol, as it impairs your body's immune system.  CONTACT YOUR DOCTOR IF YOU EXPERIENCE ANY OF THE FOLLOWING: - High fever - Ear pain - Sinus-type headache - Unusually severe cold symptoms - Cough that gets worse while other cold symptoms improve - Flare up of any chronic lung problem, such as asthma - Your symptoms persist longer than 2 weeks

## 2016-12-05 ENCOUNTER — Other Ambulatory Visit: Payer: Self-pay | Admitting: Family Medicine

## 2017-01-30 ENCOUNTER — Other Ambulatory Visit: Payer: Self-pay | Admitting: *Deleted

## 2017-01-30 ENCOUNTER — Telehealth: Payer: Self-pay | Admitting: Family Medicine

## 2017-01-30 DIAGNOSIS — K219 Gastro-esophageal reflux disease without esophagitis: Secondary | ICD-10-CM

## 2017-01-30 DIAGNOSIS — E559 Vitamin D deficiency, unspecified: Secondary | ICD-10-CM

## 2017-01-30 DIAGNOSIS — E034 Atrophy of thyroid (acquired): Secondary | ICD-10-CM

## 2017-01-30 DIAGNOSIS — E7800 Pure hypercholesterolemia, unspecified: Secondary | ICD-10-CM

## 2017-01-30 NOTE — Telephone Encounter (Signed)
Noted orders placed

## 2017-02-02 ENCOUNTER — Other Ambulatory Visit: Payer: 59

## 2017-02-02 DIAGNOSIS — E559 Vitamin D deficiency, unspecified: Secondary | ICD-10-CM

## 2017-02-02 DIAGNOSIS — K219 Gastro-esophageal reflux disease without esophagitis: Secondary | ICD-10-CM

## 2017-02-02 DIAGNOSIS — E034 Atrophy of thyroid (acquired): Secondary | ICD-10-CM

## 2017-02-02 DIAGNOSIS — E7800 Pure hypercholesterolemia, unspecified: Secondary | ICD-10-CM

## 2017-02-03 LAB — CBC WITH DIFFERENTIAL/PLATELET
BASOS: 1 %
Basophils Absolute: 0 10*3/uL (ref 0.0–0.2)
EOS (ABSOLUTE): 0.1 10*3/uL (ref 0.0–0.4)
EOS: 1 %
HEMATOCRIT: 37.8 % (ref 34.0–46.6)
Hemoglobin: 12.8 g/dL (ref 11.1–15.9)
IMMATURE GRANULOCYTES: 0 %
Immature Grans (Abs): 0 10*3/uL (ref 0.0–0.1)
Lymphocytes Absolute: 2.5 10*3/uL (ref 0.7–3.1)
Lymphs: 53 %
MCH: 32.1 pg (ref 26.6–33.0)
MCHC: 33.9 g/dL (ref 31.5–35.7)
MCV: 95 fL (ref 79–97)
MONOS ABS: 0.4 10*3/uL (ref 0.1–0.9)
Monocytes: 9 %
NEUTROS ABS: 1.7 10*3/uL (ref 1.4–7.0)
Neutrophils: 36 %
PLATELETS: 305 10*3/uL (ref 150–379)
RBC: 3.99 x10E6/uL (ref 3.77–5.28)
RDW: 13.5 % (ref 12.3–15.4)
WBC: 4.7 10*3/uL (ref 3.4–10.8)

## 2017-02-03 LAB — BMP8+EGFR
BUN / CREAT RATIO: 19 (ref 12–28)
BUN: 13 mg/dL (ref 8–27)
CO2: 26 mmol/L (ref 20–29)
Calcium: 8.9 mg/dL (ref 8.7–10.3)
Chloride: 103 mmol/L (ref 96–106)
Creatinine, Ser: 0.7 mg/dL (ref 0.57–1.00)
GFR, EST AFRICAN AMERICAN: 106 mL/min/{1.73_m2} (ref >59)
GFR, EST NON AFRICAN AMERICAN: 92 mL/min/{1.73_m2} (ref >59)
Glucose: 75 mg/dL (ref 65–99)
POTASSIUM: 4.2 mmol/L (ref 3.5–5.2)
SODIUM: 145 mmol/L — AB (ref 134–144)

## 2017-02-03 LAB — THYROID PANEL WITH TSH
Free Thyroxine Index: 2.8 (ref 1.2–4.9)
T3 Uptake Ratio: 25 % (ref 24–39)
T4 TOTAL: 11 ug/dL (ref 4.5–12.0)
TSH: 1.05 u[IU]/mL (ref 0.450–4.500)

## 2017-02-03 LAB — LIPID PANEL
CHOL/HDL RATIO: 3.1 ratio (ref 0.0–4.4)
Cholesterol, Total: 163 mg/dL (ref 100–199)
HDL: 52 mg/dL (ref >39)
LDL CALC: 99 mg/dL (ref 0–99)
TRIGLYCERIDES: 60 mg/dL (ref 0–149)
VLDL CHOLESTEROL CAL: 12 mg/dL (ref 5–40)

## 2017-02-03 LAB — HEPATIC FUNCTION PANEL
ALK PHOS: 54 IU/L (ref 39–117)
ALT: 15 IU/L (ref 0–32)
AST: 26 IU/L (ref 0–40)
Albumin: 4.5 g/dL (ref 3.6–4.8)
BILIRUBIN TOTAL: 0.7 mg/dL (ref 0.0–1.2)
BILIRUBIN, DIRECT: 0.17 mg/dL (ref 0.00–0.40)
Total Protein: 7 g/dL (ref 6.0–8.5)

## 2017-02-03 LAB — VITAMIN D 25 HYDROXY (VIT D DEFICIENCY, FRACTURES): Vit D, 25-Hydroxy: 54.3 ng/mL (ref 30.0–100.0)

## 2017-02-04 ENCOUNTER — Encounter: Payer: Self-pay | Admitting: Family Medicine

## 2017-02-04 ENCOUNTER — Ambulatory Visit (INDEPENDENT_AMBULATORY_CARE_PROVIDER_SITE_OTHER): Payer: 59 | Admitting: Family Medicine

## 2017-02-04 VITALS — BP 124/64 | HR 73 | Temp 97.3°F | Ht 62.0 in | Wt 126.0 lb

## 2017-02-04 DIAGNOSIS — E7800 Pure hypercholesterolemia, unspecified: Secondary | ICD-10-CM | POA: Diagnosis not present

## 2017-02-04 DIAGNOSIS — E559 Vitamin D deficiency, unspecified: Secondary | ICD-10-CM | POA: Diagnosis not present

## 2017-02-04 DIAGNOSIS — R202 Paresthesia of skin: Secondary | ICD-10-CM

## 2017-02-04 DIAGNOSIS — E034 Atrophy of thyroid (acquired): Secondary | ICD-10-CM | POA: Diagnosis not present

## 2017-02-04 DIAGNOSIS — K219 Gastro-esophageal reflux disease without esophagitis: Secondary | ICD-10-CM

## 2017-02-04 DIAGNOSIS — R200 Anesthesia of skin: Secondary | ICD-10-CM | POA: Diagnosis not present

## 2017-02-04 MED ORDER — SYNTHROID 88 MCG PO TABS: ORAL_TABLET | ORAL | 3 refills | Status: DC

## 2017-02-04 NOTE — Progress Notes (Signed)
Subjective:    Patient ID: Karla [name], female    DOB: 1951-01-23, 66 y.o.   MRN: [id number]  HPI Pt here for follow up and management of chronic medical problems which includes hypothyroid and hyperlipidemia. She is taking medication regularly.  The patient is doing well overall and has questions about vitamin B12.  She is requesting a refill on her thyroid medicine.  She will also be given an FOBT to return.  Her last colonoscopy was in May 2013.  She brings in blood pressures for review and they will be scanned into the record and these readings are random since May 2017 and all the readings are in the 102 120 range systolic and in the 55-70 range diastolic.  This will be scanned into the record.  She has had recent lab work and all cholesterol numbers with traditional lipid testing were good and improved with an LDL C being 99.  Vitamin D level was excellent at 54.3.  All liver function tests were normal.  All thyroid function tests including the TSH were normal.  The blood sugar renal and electrolytes were all good except the sodium was elevated by one-point.  The CBC had a normal white blood cell count with a stable hemoglobin at 12.8 and an adequate platelet count.  These will be reviewed with the patient during the visit today.  She indicates her gastroenterologist is going to do another colonoscopy this coming spring.  She is also concerned as mentioned earlier that her father had B12 deficiency and she has had some tingling in her fingers at times.  She denies any chest pain or shortness of breath.  She denies any trouble with swallowing heartburn indigestion nausea vomiting diarrhea or blood in the stool.  As long she takes her omeprazole regularly and does not eat excessive fried food she is okay with no heartburn.  She does occasionally have some breakthrough.  But this is rare.  She is passing her water without problems and gets her pelvic exams and mammograms done regularly.  She received a flu  shot at work.    Patient Active Problem List   Diagnosis Date Noted  . Vitamin D deficiency 07/31/2016  . Menopausal and perimenopausal disorder 12/08/2014  . GERD (gastroesophageal reflux disease) 07/25/2013  . Hypothyroidism 07/25/2013   Outpatient Encounter Medications as of 02/04/2017  Medication Sig  . calcium carbonate 1250 MG capsule Take 250 mg by mouth daily.   . Cholecalciferol (VITAMIN D) 2000 UNITS CAPS Take 1 capsule by mouth daily.  Marland Kitchen estradiol (ESTRACE) 0.1 MG/GM vaginal cream Place 1 Applicatorful vaginally 2 (two) times a week.  . ibandronate (BONIVA) 150 MG tablet Take 150 mg by mouth every 30 (thirty) days. Take in the morning with a full glass of water, on an empty stomach, and do not take anything else by mouth or lie down for the next 30 min.  Maxwell Caul Bicarbonate (ZEGERID OTC PO) Take by mouth.  . RESTASIS 0.05 % ophthalmic emulsion   . SYNTHROID 88 MCG tablet TAKE 1 TABLET (88 MCG TOTAL) BY MOUTH DAILY BEFORE BREAKFAST.  . [DISCONTINUED] lidocaine (XYLOCAINE) 2 % solution Swish and spit/ swallow 15cc every 6 hours as needed for sore throat/ oral pain.  . [DISCONTINUED] SYNTHROID 88 MCG tablet TAKE 1 TABLET (88 MCG TOTAL) BY MOUTH DAILY BEFORE BREAKFAST.  . [DISCONTINUED] azithromycin (ZITHROMAX Z-PAK) 250 MG tablet Take  PO day 1, then  days 2-5  . [DISCONTINUED] levothyroxine (SYNTHROID) 88 MCG tablet Take  1 tablet (88 mcg total) by mouth daily before breakfast.   No facility-administered encounter medications on file as of 02/04/2017.       Review of Systems  Constitutional: Negative.   HENT: Negative.   Eyes: Negative.   Respiratory: Negative.   Cardiovascular: Negative.   Gastrointestinal: Negative.   Endocrine: Negative.   Genitourinary: Negative.   Musculoskeletal: Negative.   Skin: Negative.   Allergic/Immunologic: Negative.   Neurological: Negative.   Hematological: Negative.   Psychiatric/Behavioral: Negative.          Objective:   Physical Exam  Constitutional: She is oriented to person, place, and time. She appears well-developed and well-nourished. No distress.  Pleasant and alert and doing well.  HENT:  Head: Normocephalic and atraumatic.  Right Ear: External ear normal.  Left Ear: External ear normal.  Nose: Nose normal.  Mouth/Throat: Oropharynx is clear and moist. No oropharyngeal exudate.  Eyes: Conjunctivae and EOM are normal. Pupils are equal, round, and reactive to light. Right eye exhibits no discharge. Left eye exhibits no discharge. No scleral icterus.  Neck: Normal range of motion. Neck supple. No thyromegaly present.  No bruits thyromegaly or anterior cervical adenopathy  Cardiovascular: Normal rate, regular rhythm, normal heart sounds and intact distal pulses.  No murmur heard. Heart is regular at 72/min  Pulmonary/Chest: Effort normal and breath sounds normal. No respiratory distress. She has no wheezes. She has no rales.  Clear anteriorly and posteriorly  Abdominal: Soft. Bowel sounds are normal. She exhibits no mass. There is no tenderness. There is no rebound and no guarding.  No abdominal tenderness masses organ enlargement suprapubic tenderness or inguinal adenopathy or bruits.  Musculoskeletal: Normal range of motion. She exhibits no edema.  Lymphadenopathy:    She has no cervical adenopathy.  Neurological: She is alert and oriented to person, place, and time. She has normal reflexes. No cranial nerve deficit.  Skin: Skin is warm and dry. No rash noted.  Psychiatric: She has a normal mood and affect. Her behavior is normal. Judgment and thought content normal.  Nursing note and vitals reviewed.   BP 124/64 (BP Location: Left Arm)   Pulse 73   Temp (!) 97.3 F (36.3 C) (Oral)   Ht 5\' 2"  (1.575 m)   Wt 126 lb (57.2 kg)   BMI 23.05 kg/m        Assessment & Plan:  1. Vitamin D deficiency -Good vitamin D levels and she will continue with current treatment  2.  Gastroesophageal reflux disease, esophagitis presence not specified -She is tried ranitidine but had breakthrough reflux and is back on omeprazole. -She is spoken to the gastroenterologist about this and will get another colonoscopy next spring.  3. Hypothyroidism due to acquired atrophy of thyroid -Continue with current treatment as thyroid levels are good.  4. Pure hypercholesterolemia -Continue with aggressive therapeutic lifestyle changes which include exercise and diet  5. Numbness and tingling in both hands -Add vitamin B12 level to current lab work  Meds ordered this encounter  Medications  . SYNTHROID 88 MCG tablet    Sig: TAKE 1 TABLET (88 MCG TOTAL) BY MOUTH DAILY BEFORE BREAKFAST.    Dispense:  90 tablet    Refill:  3   Patient Instructions  Continue current medications. Continue good therapeutic lifestyle changes which include good diet and exercise. Fall precautions discussed with patient. If an FOBT was given today- please return it to our front desk. If you are over 340 years old - you may  need Prevnar 13 or the adult Pneumonia vaccine.  **Flu shots are available--- please call and schedule a FLU-CLINIC appointment**  After your visit with [place] today you will receive a survey in the mail or online from [employer] regarding your care with [place]. Please take a moment to fill this out. Your feedback is very important to [place] as you can help [place] better understand your patient needs as well as improve your experience and satisfaction. WE CARE ABOUT YOU!!!     Arrie Senate MD

## 2017-02-04 NOTE — Patient Instructions (Signed)
Continue current medications. Continue good therapeutic lifestyle changes which include good diet and exercise. Fall precautions discussed with patient. If an FOBT was given today- please return it to our front desk. If you are over 64 years old - you may need Prevnar 13 or the adult Pneumonia vaccine.  **Flu shots are available--- please call and schedule a FLU-CLINIC appointment**  After your visit with us today you will receive a survey in the mail or online from [employer] regarding your care with us. Please take a moment to fill this out. Your feedback is very important to us as you can help us better understand your patient needs as well as improve your experience and satisfaction. WE CARE ABOUT YOU!!!    

## 2017-02-05 LAB — VITAMIN B12: VITAMIN B 12: 252 pg/mL (ref 232–1245)

## 2017-02-05 LAB — SPECIMEN STATUS REPORT

## 2017-02-06 ENCOUNTER — Other Ambulatory Visit: Payer: 59

## 2017-02-06 DIAGNOSIS — Z1211 Encounter for screening for malignant neoplasm of colon: Secondary | ICD-10-CM

## 2017-02-07 LAB — FECAL OCCULT BLOOD, IMMUNOCHEMICAL: FECAL OCCULT BLD: NEGATIVE

## 2017-05-12 ENCOUNTER — Other Ambulatory Visit: Payer: Self-pay | Admitting: Gynecology

## 2017-05-12 DIAGNOSIS — Z1231 Encounter for screening mammogram for malignant neoplasm of breast: Secondary | ICD-10-CM

## 2017-07-15 ENCOUNTER — Ambulatory Visit
Admission: RE | Admit: 2017-07-15 | Discharge: 2017-07-15 | Disposition: A | Discharge status: Home / Self Care | Payer: 59 | Source: Ambulatory Visit | Attending: Gynecology | Admitting: Gynecology

## 2017-07-15 DIAGNOSIS — Z1231 Encounter for screening mammogram for malignant neoplasm of breast: Secondary | ICD-10-CM

## 2017-07-16 ENCOUNTER — Other Ambulatory Visit: Payer: Self-pay | Admitting: Family Medicine

## 2017-08-03 ENCOUNTER — Other Ambulatory Visit: Payer: 59

## 2017-08-03 DIAGNOSIS — E7800 Pure hypercholesterolemia, unspecified: Secondary | ICD-10-CM

## 2017-08-03 DIAGNOSIS — E559 Vitamin D deficiency, unspecified: Secondary | ICD-10-CM

## 2017-08-03 DIAGNOSIS — E034 Atrophy of thyroid (acquired): Secondary | ICD-10-CM

## 2017-08-03 DIAGNOSIS — K219 Gastro-esophageal reflux disease without esophagitis: Secondary | ICD-10-CM

## 2017-08-04 ENCOUNTER — Encounter: Payer: Self-pay | Admitting: Family Medicine

## 2017-08-04 LAB — CBC WITH DIFFERENTIAL/PLATELET
BASOS ABS: 0.1 10*3/uL (ref 0.0–0.2)
BASOS: 1 %
EOS (ABSOLUTE): 0.1 10*3/uL (ref 0.0–0.4)
Eos: 3 %
HEMOGLOBIN: 13.6 g/dL (ref 11.1–15.9)
Hematocrit: 40.7 % (ref 34.0–46.6)
IMMATURE GRANS (ABS): 0 10*3/uL (ref 0.0–0.1)
IMMATURE GRANULOCYTES: 0 %
LYMPHS: 51 %
Lymphocytes Absolute: 2.5 10*3/uL (ref 0.7–3.1)
MCH: 32.1 pg (ref 26.6–33.0)
MCHC: 33.4 g/dL (ref 31.5–35.7)
MCV: 96 fL (ref 79–97)
MONOCYTES: 10 %
Monocytes Absolute: 0.5 10*3/uL (ref 0.1–0.9)
NEUTROS ABS: 1.7 10*3/uL (ref 1.4–7.0)
Neutrophils: 35 %
PLATELETS: 309 10*3/uL (ref 150–379)
RBC: 4.24 x10E6/uL (ref 3.77–5.28)
RDW: 13.5 % (ref 12.3–15.4)
WBC: 4.8 10*3/uL (ref 3.4–10.8)

## 2017-08-04 LAB — LIPID PANEL
CHOLESTEROL TOTAL: 174 mg/dL (ref 100–199)
Chol/HDL Ratio: 3.2 ratio (ref 0.0–4.4)
HDL: 54 mg/dL (ref >39)
LDL CALC: 103 mg/dL — AB (ref 0–99)
Triglycerides: 84 mg/dL (ref 0–149)
VLDL Cholesterol Cal: 17 mg/dL (ref 5–40)

## 2017-08-04 LAB — THYROID PANEL WITH TSH
FREE THYROXINE INDEX: 2.8 (ref 1.2–4.9)
T3 Uptake Ratio: 27 % (ref 24–39)
T4, Total: 10.5 ug/dL (ref 4.5–12.0)
TSH: 0.652 u[IU]/mL (ref 0.450–4.500)

## 2017-08-04 LAB — BMP8+EGFR
BUN/Creatinine Ratio: 21 (ref 12–28)
BUN: 13 mg/dL (ref 8–27)
CALCIUM: 9 mg/dL (ref 8.7–10.3)
CHLORIDE: 103 mmol/L (ref 96–106)
CO2: 24 mmol/L (ref 20–29)
Creatinine, Ser: 0.62 mg/dL (ref 0.57–1.00)
GFR calc Af Amer: 110 mL/min/{1.73_m2} (ref >59)
GFR, EST NON AFRICAN AMERICAN: 96 mL/min/{1.73_m2} (ref >59)
Glucose: 76 mg/dL (ref 65–99)
Potassium: 3.9 mmol/L (ref 3.5–5.2)
Sodium: 142 mmol/L (ref 134–144)

## 2017-08-04 LAB — HEPATIC FUNCTION PANEL
ALK PHOS: 58 IU/L (ref 39–117)
ALT: 22 IU/L (ref 0–32)
AST: 29 IU/L (ref 0–40)
Albumin: 4.5 g/dL (ref 3.6–4.8)
BILIRUBIN TOTAL: 0.8 mg/dL (ref 0.0–1.2)
Bilirubin, Direct: 0.15 mg/dL (ref 0.00–0.40)
Total Protein: 7.1 g/dL (ref 6.0–8.5)

## 2017-08-04 LAB — VITAMIN D 25 HYDROXY (VIT D DEFICIENCY, FRACTURES): Vit D, 25-Hydroxy: 58.6 ng/mL (ref 30.0–100.0)

## 2017-08-06 ENCOUNTER — Encounter: Payer: Self-pay | Admitting: Family Medicine

## 2017-08-06 ENCOUNTER — Ambulatory Visit (INDEPENDENT_AMBULATORY_CARE_PROVIDER_SITE_OTHER): Payer: 59 | Admitting: Family Medicine

## 2017-08-06 VITALS — BP 136/60 | HR 82 | Temp 97.9°F | Ht 62.0 in | Wt 127.0 lb

## 2017-08-06 DIAGNOSIS — E034 Atrophy of thyroid (acquired): Secondary | ICD-10-CM | POA: Diagnosis not present

## 2017-08-06 DIAGNOSIS — E559 Vitamin D deficiency, unspecified: Secondary | ICD-10-CM

## 2017-08-06 DIAGNOSIS — K219 Gastro-esophageal reflux disease without esophagitis: Secondary | ICD-10-CM

## 2017-08-06 DIAGNOSIS — E7800 Pure hypercholesterolemia, unspecified: Secondary | ICD-10-CM

## 2017-08-06 NOTE — Progress Notes (Signed)
Subjective:    Patient ID: Karla [name], female    DOB: 09-Nov-1950, 67 y.o.   MRN: [id number]  HPI Pt here for follow up and management of chronic medical problems which includes hypothyroid, hyperlipidemia and gerd. She is taking medication regularly.  The patient is doing well overall with no specific complaints.  She gets her pelvic exam done every 2 years and her mammogram done yearly.  She had a DEXA scan and April 2018 and a chest x-ray in September 2017.  She is up-to-date on her FOBT checks.  She had a colonoscopy just this year in February.  She brings in blood pressures for review and all of these were good and I will be scanned into the record.  She has had lab work done and this will be reviewed with her during the visit today.  Vitamin D level was good at 58.6.  The blood sugar creatinine and electrolytes were all normal.  CBC had a normal white blood cell count stable hemoglobin at 13.6 and an adequate platelet count.  Cholesterol numbers with traditional lipid testing were stable from the past with normal triglycerides a good HDL at 54 and a slightly elevated LDL C at 103.  All thyroid function tests remain normal.  All liver function tests were normal.  The patient denies any chest pain pressure tightness or shortness of breath.  She denies any trouble with swallowing heartburn indigestion nausea vomiting diarrhea or blood in the stool.  In February when she had the endoscopy she also had a colonoscopy at the same time and according to her the gastroenterologist that everything was stable.  There is no family history of colon cancer.  Patient is passing her water without problems.     Patient Active Problem List   Diagnosis Date Noted  . Vitamin D deficiency 07/31/2016  . Menopausal and perimenopausal disorder 12/08/2014  . GERD (gastroesophageal reflux disease) 07/25/2013  . Hypothyroidism 07/25/2013   Outpatient Encounter Medications as of 08/06/2017  Medication Sig  . calcium  carbonate 1250 MG capsule Take 250 mg by mouth daily.   . Cholecalciferol (VITAMIN D) 2000 UNITS CAPS Take 1 capsule by mouth daily.  Marland Kitchen estradiol (ESTRACE) 0.1 MG/GM vaginal cream Place 1 Applicatorful vaginally 2 (two) times a week.  . ibandronate (BONIVA) 150 MG tablet Take 150 mg by mouth every 30 (thirty) days. Take in the morning with a full glass of water, on an empty stomach, and do not take anything else by mouth or lie down for the next 30 min.  Maxwell Caul Bicarbonate (ZEGERID OTC PO) Take by mouth.  . RESTASIS 0.05 % ophthalmic emulsion   . SYNTHROID 88 MCG tablet TAKE 1 TABLET (88 MCG TOTAL) BY MOUTH DAILY BEFORE BREAKFAST.  . [DISCONTINUED] SYNTHROID 88 MCG tablet TAKE 1 TABLET (88 MCG TOTAL) BY MOUTH DAILY BEFORE BREAKFAST.   No facility-administered encounter medications on file as of 08/06/2017.      Review of Systems  Constitutional: Negative.   HENT: Negative.   Eyes: Negative.   Respiratory: Negative.   Cardiovascular: Negative.   Gastrointestinal: Negative.   Endocrine: Negative.   Genitourinary: Negative.   Musculoskeletal: Negative.   Skin: Negative.   Allergic/Immunologic: Negative.   Neurological: Negative.   Hematological: Negative.   Psychiatric/Behavioral: Negative.        Objective:   Physical Exam  Constitutional: She is oriented to person, place, and time. She appears well-developed and well-nourished. No distress.  Is pleasant and alert and in  good spirits.  HENT:  Head: Normocephalic and atraumatic.  Right Ear: External ear normal.  Left Ear: External ear normal.  Nose: Nose normal.  Mouth/Throat: Oropharynx is clear and moist. No oropharyngeal exudate.  Eyes: Pupils are equal, round, and reactive to light. Conjunctivae and EOM are normal. Right eye exhibits no discharge. Left eye exhibits no discharge.  Neck: Normal range of motion. Neck supple. No thyromegaly present.  No bruits thyromegaly or anterior cervical adenopathy    Cardiovascular: Normal rate, regular rhythm, normal heart sounds and intact distal pulses.  No murmur heard. Heart is regular at 72/min  Pulmonary/Chest: Effort normal and breath sounds normal. She has no wheezes. She has no rales.  Clear anteriorly and posteriorly  Abdominal: Soft. Bowel sounds are normal. She exhibits no mass. There is no tenderness. There is no rebound and no guarding.  No abdominal tenderness masses organ enlargement bruits or inguinal adenopathy  Musculoskeletal: Normal range of motion. She exhibits no edema.  Lymphadenopathy:    She has no cervical adenopathy.  Neurological: She is alert and oriented to person, place, and time. She has normal reflexes. No cranial nerve deficit.  Skin: Skin is warm and dry. No rash noted.  Psychiatric: She has a normal mood and affect. Her behavior is normal. Judgment and thought content normal.  Behavior mood and affect all normal  Nursing note and vitals reviewed.   BP 136/60 (BP Location: Left Arm)   Pulse 82   Temp 97.9 F (36.6 C) (Oral)   Ht  (1.575 m)   Wt 127 lb (57.6 kg)   BMI 23.23 kg/m        Assessment & Plan:  1. Vitamin D deficiency -Continue current treatment  2. Hypothyroidism due to acquired atrophy of thyroid -All thyroid tests were normal and she will continue with current treatment  3. Gastroesophageal reflux disease, esophagitis presence not specified -Currently no issues with reflux or symptoms with reflux.  She will continue with her current treatment of omeprazole.  4. Pure hypercholesterolemia -LDL C cholesterol was slightly elevated this time and she will continue to work with her diet and getting as much exercise as possible.  We did mention red yeast rice.  She may consider trying some of this a couple times a week.  Patient Instructions  Continue current medications. Continue good therapeutic lifestyle changes which include good diet and exercise. Fall precautions discussed with  patient. If an FOBT was given today- please return it to our front desk. If you are over 62 years old - you may need Prevnar 13 or the adult Pneumonia vaccine.  **Flu shots are available--- please call and schedule a FLU-CLINIC appointment**  After your visit with [place] today you will receive a survey in the mail or online from [employer] regarding your care with [place]. Please take a moment to fill this out. Your feedback is very important to [place] as you can help [place] better understand your patient needs as well as improve your experience and satisfaction. WE CARE ABOUT YOU!!!   Continue to drink plenty of fluids and stay well-hydrated Walk and exercise regularly Get mammograms yearly.  When the patient turns 20 which will be an late July she will need to get her Pneumovax.    Nyra Capes MD

## 2017-08-06 NOTE — Patient Instructions (Addendum)
Continue current medications. Continue good therapeutic lifestyle changes which include good diet and exercise. Fall precautions discussed with patient. If an FOBT was given today- please return it to our front desk. If you are over 65 years old - you may need Prevnar 13 or the adult Pneumonia vaccine.  **Flu shots are available--- please call and schedule a FLU-CLINIC appointment**  After your visit with [place] today you will receive a survey in the mail or online from [employer] regarding your care with [place]. Please take a moment to fill this out. Your feedback is very important to [place] as you can help [place] better understand your patient needs as well as improve your experience and satisfaction. WE CARE ABOUT YOU!!!   Continue to drink plenty of fluids and stay well-hydrated Walk and exercise regularly Get mammograms yearly.  When the patient turns 32 which will be an late July she will need to get her Pneumovax.

## 2017-08-07 LAB — VITAMIN B12: VITAMIN B 12: 225 pg/mL — AB (ref 232–1245)

## 2017-08-07 LAB — SPECIMEN STATUS REPORT

## 2017-08-11 ENCOUNTER — Ambulatory Visit (INDEPENDENT_AMBULATORY_CARE_PROVIDER_SITE_OTHER): Payer: 59 | Admitting: *Deleted

## 2017-08-11 DIAGNOSIS — E538 Deficiency of other specified B group vitamins: Secondary | ICD-10-CM

## 2017-08-11 MED ORDER — CYANOCOBALAMIN 1000 MCG/ML IJ SOLN
1000.0000 ug | INTRAMUSCULAR | Status: AC
  Administered 2017-08-11 – 2017-09-01 (×4): 1000 ug via INTRAMUSCULAR

## 2017-08-11 NOTE — Progress Notes (Signed)
Pt given cyaocobalamin inj Tolerated well

## 2017-08-18 ENCOUNTER — Ambulatory Visit (INDEPENDENT_AMBULATORY_CARE_PROVIDER_SITE_OTHER): Payer: 59 | Admitting: *Deleted

## 2017-08-18 DIAGNOSIS — E538 Deficiency of other specified B group vitamins: Secondary | ICD-10-CM | POA: Diagnosis not present

## 2017-08-18 NOTE — Progress Notes (Signed)
Patient tolerated well. Will return next week.

## 2017-08-25 ENCOUNTER — Ambulatory Visit (INDEPENDENT_AMBULATORY_CARE_PROVIDER_SITE_OTHER): Payer: 59 | Admitting: *Deleted

## 2017-08-25 DIAGNOSIS — E538 Deficiency of other specified B group vitamins: Secondary | ICD-10-CM | POA: Diagnosis not present

## 2017-08-25 NOTE — Progress Notes (Signed)
Pt given Cyanocobalamin inj Tolerated well 

## 2017-09-01 ENCOUNTER — Ambulatory Visit (INDEPENDENT_AMBULATORY_CARE_PROVIDER_SITE_OTHER): Payer: 59

## 2017-09-01 DIAGNOSIS — E538 Deficiency of other specified B group vitamins: Secondary | ICD-10-CM

## 2017-09-01 NOTE — Patient Instructions (Signed)
Cyanocobalamin, Vitamin B12 injection What is this medicine? CYANOCOBALAMIN (sye an oh koe BAL a min) is a man made form of vitamin B12. Vitamin B12 is used in the growth of healthy blood cells, nerve cells, and proteins in the body. It also helps with the metabolism of fats and carbohydrates. This medicine is used to treat people who can not absorb vitamin B12. This medicine may be used for other purposes; ask your health care provider or pharmacist if you have questions. COMMON BRAND NAME(S): B-12 Compliance Kit, B-12 Injection Kit, Cyomin, LA-12, Nutri-Twelve, Physicians EZ Use B-12, Primabalt What should I tell my health care provider before I take this medicine? They need to know if you have any of these conditions: -kidney disease -Leber's disease -megaloblastic anemia -an unusual or allergic reaction to cyanocobalamin, cobalt, other medicines, foods, dyes, or preservatives -pregnant or trying to get pregnant -breast-feeding How should I use this medicine? This medicine is injected into a muscle or deeply under the skin. It is usually given by a health care professional in a clinic or doctor's office. However, your doctor may teach you how to inject yourself. Follow all instructions. Talk to your pediatrician regarding the use of this medicine in children. Special care may be needed. Overdosage: If you think you have taken too much of this medicine contact a poison control center or emergency room at once. NOTE: This medicine is only for you. Do not share this medicine with others. What if I miss a dose? If you are given your dose at a clinic or doctor's office, call to reschedule your appointment. If you give your own injections and you miss a dose, take it as soon as you can. If it is almost time for your next dose, take only that dose. Do not take double or extra doses. What may interact with this medicine? -colchicine -heavy alcohol intake This list may not describe all possible  interactions. Give your health care provider a list of all the medicines, herbs, non-prescription drugs, or dietary supplements you use. Also tell them if you smoke, drink alcohol, or use illegal drugs. Some items may interact with your medicine. What should I watch for while using this medicine? Visit your doctor or health care professional regularly. You may need blood work done while you are taking this medicine. You may need to follow a special diet. Talk to your doctor. Limit your alcohol intake and avoid smoking to get the best benefit. What side effects may I notice from receiving this medicine? Side effects that you should report to your doctor or health care professional as soon as possible: -allergic reactions like skin rash, itching or hives, swelling of the face, lips, or tongue -blue tint to skin -chest tightness, pain -difficulty breathing, wheezing -dizziness -red, swollen painful area on the leg Side effects that usually do not require medical attention (report to your doctor or health care professional if they continue or are bothersome): -diarrhea -headache This list may not describe all possible side effects. Call your doctor for medical advice about side effects. You may report side effects to FDA at 1-800-FDA-1088. Where should I keep my medicine? Keep out of the reach of children. Store at room temperature between 15 and 30 degrees C (59 and 85 degrees F). Protect from light. Throw away any unused medicine after the expiration date. NOTE: This sheet is a summary. It may not cover all possible information. If you have questions about this medicine, talk to your doctor, pharmacist, or   health care provider.  2018 Elsevier/Gold Standard (2007-06-21 22:10:20)

## 2017-09-01 NOTE — Progress Notes (Signed)
B12 injection given to left deltoid.  Patient tolerated well. 

## 2017-10-01 ENCOUNTER — Ambulatory Visit (INDEPENDENT_AMBULATORY_CARE_PROVIDER_SITE_OTHER): Payer: Medicare Other | Admitting: *Deleted

## 2017-10-01 DIAGNOSIS — E538 Deficiency of other specified B group vitamins: Secondary | ICD-10-CM

## 2017-10-01 MED ORDER — CYANOCOBALAMIN 1000 MCG/ML IJ SOLN
1000.0000 ug | INTRAMUSCULAR | Status: AC
  Administered 2017-10-01 – 2018-08-26 (×11): 1000 ug via INTRAMUSCULAR

## 2017-10-01 NOTE — Progress Notes (Signed)
Pt given Cyanocobalamin inj Tolerated well 

## 2017-11-02 ENCOUNTER — Ambulatory Visit (INDEPENDENT_AMBULATORY_CARE_PROVIDER_SITE_OTHER): Payer: Medicare Other | Admitting: *Deleted

## 2017-11-02 DIAGNOSIS — E538 Deficiency of other specified B group vitamins: Secondary | ICD-10-CM

## 2017-11-02 NOTE — Progress Notes (Signed)
Pt given Cyanocobalamin inj Tolerated well 

## 2017-12-03 ENCOUNTER — Ambulatory Visit (INDEPENDENT_AMBULATORY_CARE_PROVIDER_SITE_OTHER): Payer: Medicare Other | Admitting: *Deleted

## 2017-12-03 DIAGNOSIS — E538 Deficiency of other specified B group vitamins: Secondary | ICD-10-CM

## 2017-12-03 NOTE — Progress Notes (Signed)
Pt given Cyanocobalamin inj Tolerated well 

## 2017-12-23 ENCOUNTER — Other Ambulatory Visit: Payer: Self-pay | Admitting: Family Medicine

## 2017-12-24 ENCOUNTER — Encounter: Payer: Self-pay | Admitting: Family Medicine

## 2018-01-04 ENCOUNTER — Ambulatory Visit (INDEPENDENT_AMBULATORY_CARE_PROVIDER_SITE_OTHER): Payer: Medicare Other | Admitting: *Deleted

## 2018-01-04 DIAGNOSIS — Z23 Encounter for immunization: Secondary | ICD-10-CM | POA: Diagnosis not present

## 2018-01-04 DIAGNOSIS — E538 Deficiency of other specified B group vitamins: Secondary | ICD-10-CM

## 2018-01-04 NOTE — Progress Notes (Signed)
Pt given cyanocobalamin inj and flu vaccine Tolerated well 

## 2018-02-02 ENCOUNTER — Other Ambulatory Visit: Payer: Medicare Other

## 2018-02-02 ENCOUNTER — Other Ambulatory Visit: Payer: Self-pay | Admitting: *Deleted

## 2018-02-02 DIAGNOSIS — E7800 Pure hypercholesterolemia, unspecified: Secondary | ICD-10-CM

## 2018-02-02 DIAGNOSIS — E538 Deficiency of other specified B group vitamins: Secondary | ICD-10-CM

## 2018-02-02 DIAGNOSIS — E034 Atrophy of thyroid (acquired): Secondary | ICD-10-CM

## 2018-02-02 DIAGNOSIS — E559 Vitamin D deficiency, unspecified: Secondary | ICD-10-CM

## 2018-02-03 LAB — VITAMIN D 25 HYDROXY (VIT D DEFICIENCY, FRACTURES): Vit D, 25-Hydroxy: 45.8 ng/mL (ref 30.0–100.0)

## 2018-02-03 LAB — CBC WITH DIFFERENTIAL/PLATELET
BASOS: 1 %
Basophils Absolute: 0.1 10*3/uL (ref 0.0–0.2)
EOS (ABSOLUTE): 0.2 10*3/uL (ref 0.0–0.4)
EOS: 2 %
Hematocrit: 36.1 % (ref 34.0–46.6)
Hemoglobin: 12.5 g/dL (ref 11.1–15.9)
IMMATURE GRANS (ABS): 0 10*3/uL (ref 0.0–0.1)
IMMATURE GRANULOCYTES: 0 %
LYMPHS: 29 %
Lymphocytes Absolute: 2.3 10*3/uL (ref 0.7–3.1)
MCH: 32.7 pg (ref 26.6–33.0)
MCHC: 34.6 g/dL (ref 31.5–35.7)
MCV: 95 fL (ref 79–97)
Monocytes Absolute: 0.5 10*3/uL (ref 0.1–0.9)
Monocytes: 6 %
NEUTROS PCT: 62 %
Neutrophils Absolute: 4.9 10*3/uL (ref 1.4–7.0)
PLATELETS: 262 10*3/uL (ref 150–450)
RBC: 3.82 x10E6/uL (ref 3.77–5.28)
RDW: 12 % — ABNORMAL LOW (ref 12.3–15.4)
WBC: 7.9 10*3/uL (ref 3.4–10.8)

## 2018-02-03 LAB — BMP8+EGFR
BUN/Creatinine Ratio: 25 (ref 12–28)
BUN: 18 mg/dL (ref 8–27)
CO2: 25 mmol/L (ref 20–29)
Calcium: 9 mg/dL (ref 8.7–10.3)
Chloride: 102 mmol/L (ref 96–106)
Creatinine, Ser: 0.71 mg/dL (ref 0.57–1.00)
GFR calc Af Amer: 103 mL/min/{1.73_m2} (ref >59)
GFR, EST NON AFRICAN AMERICAN: 90 mL/min/{1.73_m2} (ref >59)
GLUCOSE: 78 mg/dL (ref 65–99)
POTASSIUM: 3.6 mmol/L (ref 3.5–5.2)
Sodium: 141 mmol/L (ref 134–144)

## 2018-02-03 LAB — VITAMIN B12: Vitamin B-12: 685 pg/mL (ref 232–1245)

## 2018-02-03 LAB — LIPID PANEL
CHOL/HDL RATIO: 3.3 ratio (ref 0.0–4.4)
CHOLESTEROL TOTAL: 172 mg/dL (ref 100–199)
HDL: 52 mg/dL (ref >39)
LDL Calculated: 107 mg/dL — ABNORMAL HIGH (ref 0–99)
TRIGLYCERIDES: 65 mg/dL (ref 0–149)
VLDL Cholesterol Cal: 13 mg/dL (ref 5–40)

## 2018-02-03 LAB — HEPATIC FUNCTION PANEL
ALBUMIN: 4.4 g/dL (ref 3.6–4.8)
ALT: 17 IU/L (ref 0–32)
AST: 21 IU/L (ref 0–40)
Alkaline Phosphatase: 49 IU/L (ref 39–117)
BILIRUBIN TOTAL: 0.6 mg/dL (ref 0.0–1.2)
Bilirubin, Direct: 0.14 mg/dL (ref 0.00–0.40)
TOTAL PROTEIN: 7 g/dL (ref 6.0–8.5)

## 2018-02-03 LAB — THYROID PANEL WITH TSH
Free Thyroxine Index: 3 (ref 1.2–4.9)
T3 UPTAKE RATIO: 26 % (ref 24–39)
T4 TOTAL: 11.4 ug/dL (ref 4.5–12.0)
TSH: 0.517 u[IU]/mL (ref 0.450–4.500)

## 2018-02-04 ENCOUNTER — Ambulatory Visit (INDEPENDENT_AMBULATORY_CARE_PROVIDER_SITE_OTHER): Payer: Medicare Other

## 2018-02-04 ENCOUNTER — Ambulatory Visit (INDEPENDENT_AMBULATORY_CARE_PROVIDER_SITE_OTHER): Payer: Medicare Other | Admitting: Family Medicine

## 2018-02-04 ENCOUNTER — Encounter: Payer: Self-pay | Admitting: Family Medicine

## 2018-02-04 VITALS — BP 124/59 | HR 78 | Temp 97.7°F | Ht 62.0 in | Wt 128.0 lb

## 2018-02-04 DIAGNOSIS — E7800 Pure hypercholesterolemia, unspecified: Secondary | ICD-10-CM

## 2018-02-04 DIAGNOSIS — E034 Atrophy of thyroid (acquired): Secondary | ICD-10-CM

## 2018-02-04 DIAGNOSIS — K219 Gastro-esophageal reflux disease without esophagitis: Secondary | ICD-10-CM

## 2018-02-04 DIAGNOSIS — E559 Vitamin D deficiency, unspecified: Secondary | ICD-10-CM | POA: Diagnosis not present

## 2018-02-04 DIAGNOSIS — E538 Deficiency of other specified B group vitamins: Secondary | ICD-10-CM | POA: Diagnosis not present

## 2018-02-04 NOTE — Progress Notes (Signed)
Subjective:    Patient ID: Karla [name], female    DOB: 1950/08/12, 67 y.o.   MRN: [id number]  HPI Pt here for follow up and management of chronic medical problems which includes hypothyroid and hyperlipidemia. She is taking medication regularly.  The patient is here today for her regular 37-month checkup.  She has no specific complaints and does not require any refills.  Her vital signs are stable.  She is due to get her Pneumovax and B12 shot she is already had her flu shot and Prevnar vaccine.  Her mammogram was done in April and she had a DEXA scan done in April 2018.  She will get a chest x-ray today and FOBT and the lab work that she has had done will be reviewed with her during the visit today.  Her colonoscopy was in February of this year.  Cholesterol numbers with traditional lipid testing had an LDL-C cholesterol that was elevated at 107 and should be less than 100.  Triglycerides were good at 65 and the good cholesterol was good at 52.  All thyroid function tests were within normal limits.  CBC had a normal white blood cell count.  Hemoglobin was stable at 12.5 and platelets were adequate at 262.  Vitamin D level was good at 45.8.  The blood sugar was good at 78 and the creatinine was normal with ALT electrolytes including potassium being good.  All liver function tests were normal.  The B12 level was good at 685 and previously 6 months ago was low.  She is now on B12 injections.  The patient brings in blood pressures for review and all of these that were brought in were good both morning and night.  It is feeling better with the B12 injections.  She denies any chest pain pressure tightness or shortness of breath.  She denies any trouble with swallowing heartburn but only occasionally nausea vomiting diarrhea blood in the stool or black tarry bowel movements.  She did have a colonoscopy by Dr. Kinnie Scales and he says that she needs another one in 5 years.  She is passing her water without problems.  She  tries to walk daily and is careful not to put her self at risk for falling and avoid climbing.  She had her eye exam done in September and everything was good then.  She seems to have a positive attitude on doing things that are healthy for her.    Patient Active Problem List   Diagnosis Date Noted  . Vitamin D deficiency 07/31/2016  . Menopausal and perimenopausal disorder 12/08/2014  . GERD (gastroesophageal reflux disease) 07/25/2013  . Hypothyroidism 07/25/2013   Outpatient Encounter Medications as of 02/04/2018  Medication Sig  . calcium carbonate 1250 MG capsule Take 250 mg by mouth daily.   . Cholecalciferol (VITAMIN D) 2000 UNITS CAPS Take 1 capsule by mouth daily.  Marland Kitchen estradiol (ESTRACE) 0.1 MG/GM vaginal cream Place 1 Applicatorful vaginally 2 (two) times a week.  . ibandronate (BONIVA) 150 MG tablet Take 150 mg by mouth every 30 (thirty) days. Take in the morning with a full glass of water, on an empty stomach, and do not take anything else by mouth or lie down for the next 30 min.  Maxwell Caul Bicarbonate (ZEGERID OTC PO) Take by mouth.  . RESTASIS 0.05 % ophthalmic emulsion   . SYNTHROID 88 MCG tablet TAKE 1 TABLET (88 MCG TOTAL) BY MOUTH DAILY BEFORE BREAKFAST.   Facility-Administered Encounter Medications as of 02/04/2018  Medication  . cyanocobalamin ((VITAMIN B-12)) injection 1,000 mcg     Review of Systems  Constitutional: Negative.   HENT: Negative.   Eyes: Negative.   Respiratory: Negative.   Cardiovascular: Negative.   Gastrointestinal: Negative.   Endocrine: Negative.   Genitourinary: Negative.   Musculoskeletal: Negative.   Skin: Negative.   Allergic/Immunologic: Negative.   Neurological: Negative.   Hematological: Negative.   Psychiatric/Behavioral: Negative.        Objective:   Physical Exam  Constitutional: She is oriented to person, place, and time. She appears well-developed and well-nourished. No distress.  Is pleasant and alert and  no complaints and up-to-date on all of her medical issues other than getting her Pneumovax today and her B12 shot today.  We are out of the Pneumovax so she will have to come back and get that when the supply is refurbished.  HENT:  Head: Normocephalic and atraumatic.  Right Ear: External ear normal.  Left Ear: External ear normal.  Nose: Nose normal.  Minimal nasal turbinate congestion left greater than right  Eyes: Pupils are equal, round, and reactive to light. Conjunctivae and EOM are normal. Right eye exhibits no discharge. Left eye exhibits no discharge. No scleral icterus.  Up-to-date on eye exam  Neck: Normal range of motion. Neck supple. No thyromegaly present.  No bruits thyromegaly or anterior cervical adenopathy  Cardiovascular: Normal rate, regular rhythm, normal heart sounds and intact distal pulses.  No murmur heard. Heart is regular at 72/min without murmur  Pulmonary/Chest: Effort normal and breath sounds normal. She has no wheezes. She has no rales.  Clear anteriorly and posteriorly  Abdominal: Soft. Bowel sounds are normal. She exhibits no mass. There is no tenderness.  Abdomen is soft without liver or spleen enlargement epigastric tenderness bruits or inguinal adenopathy or suprapubic tenderness.  Musculoskeletal: Normal range of motion. She exhibits no edema or tenderness.  Lymphadenopathy:    She has no cervical adenopathy.  Neurological: She is alert and oriented to person, place, and time. She has normal reflexes. No cranial nerve deficit.  Reflexes are 2+ and equal bilaterally  Skin: Skin is warm and dry. No rash noted.  Psychiatric: She has a normal mood and affect. Her behavior is normal. Judgment and thought content normal.  The patient's mood affect and behavior are all normal for her.  Nursing note and vitals reviewed.  BP (!) 124/59   Pulse 78   Temp 97.7 F (36.5 C) (Oral)   Ht 5\' 2"  (1.575 m)   Wt 128 lb (58.1 kg)   BMI 23.41 kg/m           Assessment & Plan:  1. Vitamin D deficiency -Continue with vitamin D replacement as currently doing  2. Hypothyroidism due to acquired atrophy of thyroid -Thyroid tests were good and she will continue with current treatment  3. Pure hypercholesterolemia -LDL-C was slightly increased and patient will try to do better with diet and exercise.  Consider in the future a once a week dose of a low-dose statin drug. - DG Chest 2 View; Future  4. B12 deficiency -Continue with monthly B12 injections  5. Gastroesophageal reflux disease, esophagitis presence not specified -Patient is taking Dexilant she will continue with this and can supplement this if needed with Pepcid AC.  Patient Instructions                       Medicare Annual Wellness Visit  [place] and the medical providers  at [facility] strive to bring you the best medical care.  In doing so we not only want to address your current medical conditions and concerns but also to detect new conditions early and prevent illness, disease and health-related problems.    Medicare offers a yearly Wellness Visit which allows our clinical staff to assess your need for preventative services including immunizations, lifestyle education, counseling to decrease risk of preventable diseases and screening for fall risk and other medical concerns.    This visit is provided free of charge (no copay) for all Medicare recipients. The clinical pharmacists at [facility] have begun to conduct these Wellness Visits which will also include a thorough review of all your medications.    As you primary medical provider recommend that you make an appointment for your Annual Wellness Visit if you have not done so already this year.  You may set up this appointment before you leave today or you may call back [phone]) and schedule an appointment.  Please make sure when you call that you mention that you are scheduling  your Annual Wellness Visit with the clinical pharmacist so that the appointment may be made for the proper length of time.     Continue current medications. Continue good therapeutic lifestyle changes which include good diet and exercise. Fall precautions discussed with patient. If an FOBT was given today- please return it to our front desk. If you are over 65 years old - you may need Prevnar 13 or the adult Pneumonia vaccine.  **Flu shots are available--- please call and schedule a FLU-CLINIC appointment**  After your visit with us today you will receive a survey in the mail or online from [employer] regarding your care with us. Please take a moment to fill this out. Your feedback is very important to us as you can help us better understand your patient needs as well as improve your experience and satisfaction. WE CARE ABOUT YOU!!!   The patient should continue with her current therapeutic regimen but try to do better with therapeutic lifestyle changes to get a lower back cholesterol.  She should stay on her B12 injections and her vitamin D3.  We will check the cholesterol at the next visit and if it does not come down we will consider adding a very low dose of a statin drug at that time. Continue to drink plenty of fluids and stay well-hydrated Stay active physically and walk regularly  Nyra Capeson W. Moore MD

## 2018-02-04 NOTE — Patient Instructions (Addendum)
Medicare Annual Wellness Visit  [place] and the medical providers at [facility] strive to bring you the best medical care.  In doing so we not only want to address your current medical conditions and concerns but also to detect new conditions early and prevent illness, disease and health-related problems.    Medicare offers a yearly Wellness Visit which allows our clinical staff to assess your need for preventative services including immunizations, lifestyle education, counseling to decrease risk of preventable diseases and screening for fall risk and other medical concerns.    This visit is provided free of charge (no copay) for all Medicare recipients. The clinical pharmacists at [facility] have begun to conduct these Wellness Visits which will also include a thorough review of all your medications.    As you primary medical provider recommend that you make an appointment for your Annual Wellness Visit if you have not done so already this year.  You may set up this appointment before you leave today or you may call back [phone]) and schedule an appointment.  Please make sure when you call that you mention that you are scheduling your Annual Wellness Visit with the clinical pharmacist so that the appointment may be made for the proper length of time.     Continue current medications. Continue good therapeutic lifestyle changes which include good diet and exercise. Fall precautions discussed with patient. If an FOBT was given today- please return it to our front desk. If you are over 65 years old - you may need Prevnar 13 or the adult Pneumonia vaccine.  **Flu shots are available--- please call and schedule a FLU-CLINIC appointment**  After your visit with us today you will receive a survey in the mail or online from [employer] regarding your care with us. Please take a moment to fill this out. Your feedback is very  important to us as you can help us better understand your patient needs as well as improve your experience and satisfaction. WE CARE ABOUT YOU!!!   The patient should continue with her current therapeutic regimen but try to do better with therapeutic lifestyle changes to get a lower back cholesterol.  She should stay on her B12 injections and her vitamin D3.  We will check the cholesterol at the next visit and if it does not come down we will consider adding a very low dose of a statin drug at that time. Continue to drink plenty of fluids and stay well-hydrated Stay active physically and walk regularly

## 2018-02-08 ENCOUNTER — Encounter: Payer: Self-pay | Admitting: Family Medicine

## 2018-02-08 ENCOUNTER — Other Ambulatory Visit: Payer: Self-pay | Admitting: *Deleted

## 2018-02-08 MED ORDER — ROSUVASTATIN CALCIUM 5 MG PO TABS: ORAL_TABLET | ORAL | 1 refills | Status: DC

## 2018-03-01 ENCOUNTER — Other Ambulatory Visit: Payer: Medicare Other

## 2018-03-01 DIAGNOSIS — Z1212 Encounter for screening for malignant neoplasm of rectum: Secondary | ICD-10-CM

## 2018-03-04 LAB — FECAL OCCULT BLOOD, IMMUNOCHEMICAL: Fecal Occult Bld: NEGATIVE

## 2018-03-08 ENCOUNTER — Ambulatory Visit (INDEPENDENT_AMBULATORY_CARE_PROVIDER_SITE_OTHER): Payer: Medicare Other | Admitting: *Deleted

## 2018-03-08 DIAGNOSIS — E538 Deficiency of other specified B group vitamins: Secondary | ICD-10-CM

## 2018-03-08 NOTE — Progress Notes (Signed)
Pt given Cyanocobalamin inj Tolerated well 

## 2018-04-08 ENCOUNTER — Ambulatory Visit (INDEPENDENT_AMBULATORY_CARE_PROVIDER_SITE_OTHER): Payer: Medicare Other | Admitting: *Deleted

## 2018-04-08 DIAGNOSIS — Z23 Encounter for immunization: Secondary | ICD-10-CM | POA: Diagnosis not present

## 2018-04-08 DIAGNOSIS — E538 Deficiency of other specified B group vitamins: Secondary | ICD-10-CM

## 2018-04-08 DIAGNOSIS — E7800 Pure hypercholesterolemia, unspecified: Secondary | ICD-10-CM

## 2018-04-08 NOTE — Patient Instructions (Signed)

## 2018-04-08 NOTE — Progress Notes (Signed)
Vitamin b12 and pneumovax 23 given

## 2018-04-09 LAB — HEPATIC FUNCTION PANEL
ALBUMIN: 4.6 g/dL (ref 3.6–4.8)
ALT: 18 IU/L (ref 0–32)
AST: 23 IU/L (ref 0–40)
Alkaline Phosphatase: 54 IU/L (ref 39–117)
BILIRUBIN TOTAL: 0.5 mg/dL (ref 0.0–1.2)
Bilirubin, Direct: 0.14 mg/dL (ref 0.00–0.40)
TOTAL PROTEIN: 7.2 g/dL (ref 6.0–8.5)

## 2018-05-10 ENCOUNTER — Ambulatory Visit (INDEPENDENT_AMBULATORY_CARE_PROVIDER_SITE_OTHER): Payer: Medicare Other | Admitting: *Deleted

## 2018-05-10 DIAGNOSIS — E538 Deficiency of other specified B group vitamins: Secondary | ICD-10-CM | POA: Diagnosis not present

## 2018-05-10 NOTE — Progress Notes (Signed)
Pt given cyanocobalamin inj Tolerated well 

## 2018-06-08 ENCOUNTER — Ambulatory Visit: Payer: Medicare Other

## 2018-06-11 ENCOUNTER — Encounter: Payer: Self-pay | Admitting: Family Medicine

## 2018-06-14 ENCOUNTER — Ambulatory Visit: Payer: Medicare Other

## 2018-07-07 ENCOUNTER — Ambulatory Visit: Payer: Medicare Other

## 2018-07-12 ENCOUNTER — Telehealth: Payer: Self-pay | Admitting: Family Medicine

## 2018-07-12 ENCOUNTER — Other Ambulatory Visit: Payer: Self-pay

## 2018-07-12 ENCOUNTER — Ambulatory Visit (INDEPENDENT_AMBULATORY_CARE_PROVIDER_SITE_OTHER): Payer: Medicare Other | Admitting: *Deleted

## 2018-07-12 DIAGNOSIS — E538 Deficiency of other specified B group vitamins: Secondary | ICD-10-CM

## 2018-07-12 NOTE — Progress Notes (Signed)
Pt given Cyanocobalamin inj Tolerated well 

## 2018-07-12 NOTE — Telephone Encounter (Signed)
Pt aware to get B12

## 2018-07-18 ENCOUNTER — Other Ambulatory Visit: Payer: Self-pay | Admitting: Family Medicine

## 2018-07-21 ENCOUNTER — Other Ambulatory Visit: Payer: Self-pay | Admitting: Family Medicine

## 2018-07-26 ENCOUNTER — Ambulatory Visit (INDEPENDENT_AMBULATORY_CARE_PROVIDER_SITE_OTHER): Payer: Medicare Other | Admitting: *Deleted

## 2018-07-26 ENCOUNTER — Other Ambulatory Visit: Payer: Self-pay

## 2018-07-26 DIAGNOSIS — E538 Deficiency of other specified B group vitamins: Secondary | ICD-10-CM

## 2018-07-26 NOTE — Progress Notes (Signed)
Pt given Cyanocobalamin inj Tolerated well 

## 2018-08-02 ENCOUNTER — Encounter: Payer: Self-pay | Admitting: Family Medicine

## 2018-08-05 ENCOUNTER — Other Ambulatory Visit: Payer: Self-pay

## 2018-08-05 ENCOUNTER — Encounter: Payer: Self-pay | Admitting: Family Medicine

## 2018-08-05 ENCOUNTER — Ambulatory Visit (INDEPENDENT_AMBULATORY_CARE_PROVIDER_SITE_OTHER): Payer: Medicare Other | Admitting: Family Medicine

## 2018-08-05 DIAGNOSIS — E559 Vitamin D deficiency, unspecified: Secondary | ICD-10-CM | POA: Diagnosis not present

## 2018-08-05 DIAGNOSIS — K219 Gastro-esophageal reflux disease without esophagitis: Secondary | ICD-10-CM

## 2018-08-05 DIAGNOSIS — E538 Deficiency of other specified B group vitamins: Secondary | ICD-10-CM | POA: Diagnosis not present

## 2018-08-05 DIAGNOSIS — M8580 Other specified disorders of bone density and structure, unspecified site: Secondary | ICD-10-CM | POA: Insufficient documentation

## 2018-08-05 DIAGNOSIS — Z780 Asymptomatic menopausal state: Secondary | ICD-10-CM | POA: Insufficient documentation

## 2018-08-05 DIAGNOSIS — E034 Atrophy of thyroid (acquired): Secondary | ICD-10-CM

## 2018-08-05 DIAGNOSIS — E7800 Pure hypercholesterolemia, unspecified: Secondary | ICD-10-CM | POA: Diagnosis not present

## 2018-08-05 DIAGNOSIS — R070 Pain in throat: Secondary | ICD-10-CM

## 2018-08-05 DIAGNOSIS — M810 Age-related osteoporosis without current pathological fracture: Secondary | ICD-10-CM

## 2018-08-05 MED ORDER — ROSUVASTATIN CALCIUM 5 MG PO TABS: ORAL_TABLET | ORAL | 3 refills | Status: DC

## 2018-08-05 NOTE — Addendum Note (Signed)
Addended by: Magdalene River on: 08/05/2018 01:22 PM   Modules accepted: Orders

## 2018-08-05 NOTE — Patient Instructions (Addendum)
Please come by the office for fasting blood work at a convenient time about 2 weeks after your last B12 injection. Continue with current medicines until lab work is returned Drink plenty of water and fluids Practice weight bearing exercise and avoid climbing to prevent falls and fractures Continue to take calcium and vitamin D and Boniva Continue statin therapy until lab work is returned Continue aggressive therapeutic lifestyle changes Arrange visit with ear nose and throat to evaluate throat discomfort Patient will call Dr. Kinnie Scales to see if and when an endoscopy can be safely done. She will continue in the meantime with her Dexilant and Pepcid

## 2018-08-05 NOTE — Progress Notes (Signed)
Virtual Visit Via telephone Note I connected with@ on 08/05/18 by telephone and verified that I am speaking with the correct person or authorized healthcare agent using two identifiers. Karla [name] is currently located at home and there are no unauthorized people in close proximity. I completed this visit while in a private location in my home .  This visit type was conducted due to national recommendations for restrictions regarding the COVID-19 Pandemic (e.g. social distancing).  This format is felt to be most appropriate for this patient at this time.  All issues noted in this document were discussed and addressed.  No physical exam was performed.    I discussed the limitations, risks, security and privacy concerns of performing an evaluation and management service by telephone and the availability of in person appointments. I also discussed with the patient that there may be a patient responsible charge related to this service. The patient expressed understanding and agreed to proceed.   Date:  08/05/2018    ID:  [name]      [birth year]        [id number]   Patient Care Team Patient Care Team: Ernestina Penna, MD as PCP - [facility])  Reason for Visit: Primary Care Follow-up     History of Present Illness & Review of Systems:     Karla [name] is a 68 y.o. year old female primary care patient that presents today for a telehealth visit.  The patient is pleasant and doing well.  She is working at home.  Despite working at home and being confined more she is still walking and exercising regularly in the yard and outside and in her home.  She missed a B12 shot but then did a couple shots 2 weeks apart.  Her energy level is good.  She denies any chest pain or shortness of breath.  She still continues to have some discomfort in her throat and had an endoscopy planned but never got to get this done with the gastroenterologist.  She is taking Dexilant and Pepcid but the  discomfort still persist and in fact sometimes it seems to get better after eating.  She denies any changes in her bowel habits though does have some slight constipation and is trying to drink more water.  There is no blood in the stool.  She is passing her water well.  Review of systems as stated, otherwise negative.  The patient does not have symptoms concerning for COVID-19 infection (fever, chills, cough, or new shortness of breath).      Current Medications (Verified) Allergies as of 08/05/2018   No Known Allergies     Medication List       Accurate as of Aug 05, 2018 11:53 AM. If you have any questions, ask your nurse or doctor.        Boniva 150 MG tablet Generic drug:  ibandronate Take 150 mg by mouth every 30 (thirty) days. Take in the morning with a full glass of water, on an empty stomach, and do not take anything else by mouth or lie down for the next 30 min.   calcium carbonate 1250 MG capsule Take 250 mg by mouth daily.   estradiol 0.1 MG/GM vaginal cream Commonly known as:  ESTRACE Place 1 Applicatorful vaginally 2 (two) times a week.   Restasis 0.05 % ophthalmic emulsion Generic drug:  cycloSPORINE   rosuvastatin 5 MG tablet Commonly known as:  CRESTOR TAKE 0.5 TAB ON MON, WED  AND FRI EVENINGS.   Synthroid 88 MCG tablet Generic drug:  levothyroxine TAKE 1 TABLET (88 MCG TOTAL) BY MOUTH DAILY BEFORE BREAKFAST.   Vitamin D 50 MCG (2000 UT) Caps Take 1 capsule by mouth daily.   ZEGERID OTC PO Take by mouth.           Allergies (Verified)    Patient has no known allergies.  Past Medical History Past Medical History:  Diagnosis Date   GERD (gastroesophageal reflux disease)    Thyroid disease      Past Surgical History:  Procedure Laterality Date   CERVICAL SPINE SURGERY     herniated disc    Social History   Socioeconomic History   Marital status: Married    Spouse name: Not on file   Number of children: Not on file   Years of  education: Not on file   Highest education level: Not on file  Occupational History   Not on file  Social Needs   Financial resource strain: Not on file   Food insecurity:    Worry: Not on file    Inability: Not on file   Transportation needs:    Medical: Not on file    Non-medical: Not on file  Tobacco Use   Smoking status: Never Smoker   Smokeless tobacco: Never Used  Substance and Sexual Activity   Alcohol use: No   Drug use: No   Sexual activity: Not on file  Lifestyle   Physical activity:    Days per week: Not on file    Minutes per session: Not on file   Stress: Not on file  Relationships   Social connections:    Talks on phone: Not on file    Gets together: Not on file    Attends religious service: Not on file    Active member of club or organization: Not on file    Attends meetings of clubs or organizations: Not on file    Relationship status: Not on file  Other Topics Concern   Not on file  Social History Narrative   Not on file     Family History  Problem Relation Age of Onset   Hypertension Mother    Hyperlipidemia Mother    Hypertension Father       Labs/Other Tests and Data Reviewed:    Wt Readings from Last 3 Encounters:  02/04/18 128 lb (58.1 kg)  08/06/17 127 lb (57.6 kg)  02/04/17 126 lb (57.2 kg)   Temp Readings from Last 3 Encounters:  02/04/18 97.7 F (36.5 C) (Oral)  08/06/17 97.9 F (36.6 C) (Oral)  02/04/17 (!) 97.3 F (36.3 C) (Oral)   BP Readings from Last 3 Encounters:  02/04/18 (!) 124/59  08/06/17 136/60  02/04/17 124/64   Pulse Readings from Last 3 Encounters:  02/04/18 78  08/06/17 82  02/04/17 73     No results found for: HGBA1C Lab Results  Component Value Date   LDLCALC 107 (H) 02/02/2018   CREATININE 0.71 02/02/2018       Chemistry      Component Value Date/Time   NA 141 02/02/2018 1128   K 3.6 02/02/2018 1128   CL 102 02/02/2018 1128   CO2 25 02/02/2018 1128   BUN 18 02/02/2018  1128   CREATININE 0.71 02/02/2018 1128      Component Value Date/Time   CALCIUM 9.0 02/02/2018 1128   ALKPHOS 54 04/08/2018 1501   AST 23 04/08/2018 1501   ALT 18 04/08/2018  1501   BILITOT 0.5 04/08/2018 1501         OBSERVATIONS/ OBJECTIVE:     The patient is pleasant and doing well and energy wise is feeling well.  She is working from home on her job.  Her blood pressures at home have been good in the 103 range and even as low as 90 systolic in the 60s to low 70s.  Her weight is stable at 125.  It is important to note that she was taking Boniva for osteopenia but discontinued this at the recommendation of her gynecologist because of the concern for the discomfort in her throat.  She cannot say that discontinuing the Sandrea HammondBoniva has had any difference on the throat discomfort.  She did have an endoscopy planned but this was canceled due to coronavirus.  She will call the gastroenterologist back and see if that can be rescheduled and we will go ahead and schedule her for a visit with ear nose and throat to also look at an ENT issue that could be causing the discomfort.  She is due to get repeat lab work and we would ask her to come by early next week for this.  She will call my nurse to confirm that it is a good time to come.  Physical exam deferred due to nature of telephonic visit.  ASSESSMENT & PLAN    Time:   Today, I have spent 28 minutes with the patient via telephone discussing the above including Covid precautions.     Visit Diagnoses: 1. B12 deficiency -Continue with monthly B12 shots.  Get B12 level with next blood draw  2. Pure hypercholesterolemia -Continue low-dose Crestor and follow aggressive therapeutic lifestyle changes  3. Elevated LDL cholesterol level -Continue low-dose Crestor  4. Hypothyroidism due to acquired atrophy of thyroid -Continue with Synthroid 88 mcg 1 daily pending results of lab work  5. Vitamin D deficiency -Continue with vitamin D replacement  pending results of lab work  6. Gastroesophageal reflux disease, esophagitis presence not specified -Continue with Dexilant and Pepcid as currently doing.  Patient will call gastroenterologist up to see if and when an endoscopy can be done to further evaluate the throat discomfort -We will also arrange a visit with ear nose and throat to evaluate the throat discomfort Patient Instructions  Please come by the office for fasting blood work at a convenient time about 2 weeks after your last B12 injection. Continue with current medicines until lab work is returned Drink plenty of water and fluids Practice weight bearing exercise and avoid climbing to prevent falls and fractures Continue to take calcium and vitamin D and Boniva Continue statin therapy until lab work is returned Continue aggressive therapeutic lifestyle changes Arrange visit with ear nose and throat to evaluate throat discomfort Patient will call Dr. Kinnie ScalesMedoff to see if and when an endoscopy can be safely done. She will continue in the meantime with her Dexilant and Pepcid      The above assessment and management plan was discussed with the patient. The patient verbalized understanding of and has agreed to the management plan. Patient is aware to call the clinic if symptoms persist or worsen. Patient is aware when to return to the clinic for a follow-up visit. Patient educated on when it is appropriate to go to the emergency department.    Ernestina Pennaonald W. Pancho Rushing, MD [facility] [address], [place], [place] [id number] Ph [phone]   Nyra Capeson W. Altamese Deguire MD

## 2018-08-23 DIAGNOSIS — K219 Gastro-esophageal reflux disease without esophagitis: Secondary | ICD-10-CM | POA: Insufficient documentation

## 2018-08-26 ENCOUNTER — Other Ambulatory Visit: Payer: Self-pay

## 2018-08-26 ENCOUNTER — Ambulatory Visit (INDEPENDENT_AMBULATORY_CARE_PROVIDER_SITE_OTHER): Payer: Medicare Other | Admitting: *Deleted

## 2018-08-26 DIAGNOSIS — E538 Deficiency of other specified B group vitamins: Secondary | ICD-10-CM | POA: Diagnosis not present

## 2018-08-26 NOTE — Progress Notes (Signed)
Pt given cyanocobalamin inj Tolerated well 

## 2018-09-24 ENCOUNTER — Other Ambulatory Visit: Payer: Self-pay

## 2018-09-27 ENCOUNTER — Ambulatory Visit (INDEPENDENT_AMBULATORY_CARE_PROVIDER_SITE_OTHER): Payer: Medicare Other | Admitting: *Deleted

## 2018-09-27 ENCOUNTER — Other Ambulatory Visit: Payer: Self-pay

## 2018-09-27 DIAGNOSIS — E538 Deficiency of other specified B group vitamins: Secondary | ICD-10-CM

## 2018-09-27 MED ORDER — CYANOCOBALAMIN 1000 MCG/ML IJ SOLN
1000.0000 ug | INTRAMUSCULAR | Status: AC
  Administered 2018-09-27 – 2019-09-19 (×12): 1000 ug via INTRAMUSCULAR

## 2018-09-27 NOTE — Progress Notes (Signed)
Pt given Cyanocobalamin inj Tolerated well 

## 2018-10-13 ENCOUNTER — Other Ambulatory Visit: Payer: Self-pay | Admitting: Family Medicine

## 2018-10-27 ENCOUNTER — Telehealth: Payer: Self-pay | Admitting: Family Medicine

## 2018-10-27 ENCOUNTER — Other Ambulatory Visit: Payer: Self-pay

## 2018-10-28 ENCOUNTER — Ambulatory Visit (INDEPENDENT_AMBULATORY_CARE_PROVIDER_SITE_OTHER): Payer: Medicare Other | Admitting: *Deleted

## 2018-10-28 DIAGNOSIS — E538 Deficiency of other specified B group vitamins: Secondary | ICD-10-CM | POA: Diagnosis not present

## 2018-10-28 NOTE — Progress Notes (Signed)
Pt give Cyanocobalamin inj Tolerated well

## 2018-11-16 ENCOUNTER — Ambulatory Visit (INDEPENDENT_AMBULATORY_CARE_PROVIDER_SITE_OTHER): Payer: Medicare Other | Admitting: *Deleted

## 2018-11-16 ENCOUNTER — Other Ambulatory Visit: Payer: Self-pay | Admitting: *Deleted

## 2018-11-16 VITALS — Ht 62.0 in | Wt 128.0 lb

## 2018-11-16 DIAGNOSIS — Z0000 Encounter for general adult medical examination without abnormal findings: Secondary | ICD-10-CM | POA: Diagnosis not present

## 2018-11-16 NOTE — Patient Instructions (Signed)
Karla [name] , Thank you for taking time to come for your Medicare Wellness Visit. I appreciate your ongoing commitment to your health goals. Please review the following plan we discussed and let me know if I can assist you in the future.   These are the goals we discussed: Goals    . Exercise 3x per week (30 min per time)     Continue to exercise for at least 30 minutes, 3 times weekly.        This is a list of the screening recommended for you and due dates:  Health Maintenance  Topic Date Due  . DEXA scan (bone density measurement)  07/15/2018  . Flu Shot  10/23/2018  . Mammogram  07/16/2019  . Colon Cancer Screening  05/04/2022  . Tetanus Vaccine  12/02/2023  .  Hepatitis C: One time screening is recommended by Center for Disease Control  (CDC) for  adults born from [birth year] through 1963.   Completed  . Pneumonia vaccines  Completed      Advance Directive  Advance directives are legal documents that let you make choices ahead of time about your health care and medical treatment in case you become unable to communicate for yourself. Advance directives are a way for you to communicate your wishes to family, friends, and health care providers. This can help convey your decisions about end-of-life care if you become unable to communicate. Discussing and writing advance directives should happen over time rather than all at once. Advance directives can be changed depending on your situation and what you want, even after you have signed the advance directives. If you do not have an advance directive, some states assign family decision makers to act on your behalf based on how closely you are related to them. Each state has its own laws regarding advance directives. You may want to check with your health care provider, attorney, or state representative about the laws in your state. There are different types of advance directives, such as:  Medical power of attorney.  Living will.  Do not  resuscitate (DNR) or do not attempt resuscitation (DNAR) order. Health care proxy and medical power of attorney A health care proxy, also called a health care agent, is a person who is appointed to make medical decisions for you in cases in which you are unable to make the decisions yourself. Generally, people choose someone they know well and trust to represent their preferences. Make sure to ask this person for an agreement to act as your proxy. A proxy may have to exercise judgment in the event of a medical decision for which your wishes are not known. A medical power of attorney is a legal document that names your health care proxy. Depending on the laws in your state, after the document is written, it may also need to be:  Signed.  Notarized.  Dated.  Copied.  Witnessed.  Incorporated into your medical record. You may also want to appoint someone to manage your financial affairs in a situation in which you are unable to do so. This is called a durable power of attorney for finances. It is a separate legal document from the durable power of attorney for health care. You may choose the same person or someone different from your health care proxy to act as your agent in financial matters. If you do not appoint a proxy, or if there is a concern that the proxy is not acting in your best interests, a court-appointed guardian  may be designated to act on your behalf. Living will A living will is a set of instructions documenting your wishes about medical care when you cannot express them yourself. Health care providers should keep a copy of your living will in your medical record. You may want to give a copy to family members or friends. To alert caregivers in case of an emergency, you can place a card in your wallet to let them know that you have a living will and where they can find it. A living will is used if you become:  Terminally ill.  Incapacitated.  Unable to communicate or make  decisions. Items to consider in your living will include:  The use or non-use of life-sustaining equipment, such as dialysis machines and breathing machines (ventilators).  A DNR or DNAR order, which is the instruction not to use cardiopulmonary resuscitation (CPR) if breathing or heartbeat stops.  The use or non-use of tube feeding.  Withholding of food and fluids.  Comfort (palliative) care when the goal becomes comfort rather than a cure.  Organ and tissue donation. A living will does not give instructions for distributing your money and property if you should pass away. It is recommended that you seek the advice of a lawyer when writing a will. Decisions about taxes, beneficiaries, and asset distribution will be legally binding. This process can relieve your family and friends of any concerns surrounding disputes or questions that may come up about the distribution of your assets. DNR or DNAR A DNR or DNAR order is a request not to have CPR in the event that your heart stops beating or you stop breathing. If a DNR or DNAR order has not been made and shared, a health care provider will try to help any patient whose heart has stopped or who has stopped breathing. If you plan to have surgery, talk with your health care provider about how your DNR or DNAR order will be followed if problems occur. Summary  Advance directives are the legal documents that allow you to make choices ahead of time about your health care and medical treatment in case you become unable to communicate for yourself.  The process of discussing and writing advance directives should happen over time. You can change the advance directives, even after you have signed them.  Advance directives include DNR or DNAR orders, living wills, and designating an agent as your medical power of attorney. This information is not intended to replace advice given to you by your health care provider. Make sure you discuss any questions you  have with your health care provider. Document Released: 06/17/2007 Document Revised: 04/14/2018 Document Reviewed: 01/28/2016 Elsevier Patient Education  2020 [place] 65 Years and Older, Female Preventive care refers to lifestyle choices and visits with your health care provider that can promote health and wellness. This includes:  A yearly physical exam. This is also called an annual well check.  Regular dental and eye exams.  Immunizations.  Screening for certain conditions.  Healthy lifestyle choices, such as diet and exercise. What can I expect for my preventive care visit? Physical exam Your health care provider will check:  Height and weight. These may be used to calculate body mass index (BMI), which is a measurement that tells if you are at a healthy weight.  Heart rate and blood pressure.  Your skin for abnormal spots. Counseling Your health care provider may ask you questions about:  Alcohol, tobacco, and drug  use.  Emotional well-being.  Home and relationship well-being.  Sexual activity.  Eating habits.  History of falls.  Memory and ability to understand (cognition).  Work and work Statistician.  Pregnancy and menstrual history. What immunizations do I need?  Influenza (flu) vaccine  This is recommended every year. Tetanus, diphtheria, and pertussis (Tdap) vaccine  You may need a Td booster every 10 years. Varicella (chickenpox) vaccine  You may need this vaccine if you have not already been vaccinated. Zoster (shingles) vaccine  You may need this after age 5. Pneumococcal conjugate (PCV13) vaccine  One dose is recommended after age 88. Pneumococcal polysaccharide (PPSV23) vaccine  One dose is recommended after age 77. Measles, mumps, and rubella (MMR) vaccine  You may need at least one dose of MMR if you were born in 1957 or later. You may also need a second dose. Meningococcal conjugate (MenACWY) vaccine   You may need this if you have certain conditions. Hepatitis A vaccine  You may need this if you have certain conditions or if you travel or work in places where you may be exposed to hepatitis A. Hepatitis B vaccine  You may need this if you have certain conditions or if you travel or work in places where you may be exposed to hepatitis B. Haemophilus influenzae type b (Hib) vaccine  You may need this if you have certain conditions. You may receive vaccines as individual doses or as more than one vaccine together in one shot (combination vaccines). Talk with your health care provider about the risks and benefits of combination vaccines. What tests do I need? Blood tests  Lipid and cholesterol levels. These may be checked every 5 years, or more frequently depending on your overall health.  Hepatitis C test.  Hepatitis B test. Screening  Lung cancer screening. You may have this screening every year starting at age 67 if you have a 30-pack-year history of smoking and currently smoke or have quit within the past 15 years.  Colorectal cancer screening. All adults should have this screening starting at age 66 and continuing until age 46. Your health care provider may recommend screening at age 63 if you are at increased risk. You will have tests every 1-10 years, depending on your results and the type of screening test.  Diabetes screening. This is done by checking your blood sugar (glucose) after you have not eaten for a while (fasting). You may have this done every 1-3 years.  Mammogram. This may be done every 1-2 years. Talk with your health care provider about how often you should have regular mammograms.  BRCA-related cancer screening. This may be done if you have a family history of breast, ovarian, tubal, or peritoneal cancers. Other tests  Sexually transmitted disease (STD) testing.  Bone density scan. This is done to screen for osteoporosis. You may have this done starting at age  64. Follow these instructions at home: Eating and drinking  Eat a diet that includes fresh fruits and vegetables, whole grains, lean protein, and low-fat dairy products. Limit your intake of foods with high amounts of sugar, saturated fats, and salt.  Take vitamin and mineral supplements as recommended by your health care provider.  Do not drink alcohol if your health care provider tells you not to drink.  If you drink alcohol: ? Limit how much you have to 0-1 drink a day. ? Be aware of how much alcohol is in your drink. In the [place] one drink equals one 12 oz bottle  of beer (355 mL), one 5 oz glass of wine (148 mL), or one 1 oz glass of hard liquor (44 mL). Lifestyle  Take daily care of your teeth and gums.  Stay active. Exercise for at least 30 minutes on 5 or more days each week.  Do not use any products that contain nicotine or tobacco, such as cigarettes, e-cigarettes, and chewing tobacco. If you need help quitting, ask your health care provider.  If you are sexually active, practice safe sex. Use a condom or other form of protection in order to prevent STIs (sexually transmitted infections).  Talk with your health care provider about taking a low-dose aspirin or statin. What's next?  Go to your health care provider once a year for a well check visit.  Ask your health care provider how often you should have your eyes and teeth checked.  Stay up to date on all vaccines. This information is not intended to replace advice given to you by your health care provider. Make sure you discuss any questions you have with your health care provider. Document Released: 04/06/2015 Document Revised: 03/04/2018 Document Reviewed: 03/04/2018 Elsevier Patient Education  2020 [employer].

## 2018-11-16 NOTE — Progress Notes (Addendum)
MEDICARE ANNUAL WELLNESS VISIT  11/16/2018  Telephone Visit Disclaimer This Medicare AWV was conducted by telephone due to national recommendations for restrictions regarding the COVID-19 Pandemic (e.g. social distancing).  I verified, using two identifiers, that I am speaking with Karla [name] or their authorized healthcare agent. I discussed the limitations, risks, security, and privacy concerns of performing an evaluation and management service by telephone and the potential availability of an in-person appointment in the future. The patient expressed understanding and agreed to proceed.   Subjective:  Karla [name] is a 68 y.o. female patient of Karla [name] who had a Medicare Annual Wellness Visit today via telephone. Karla [name] is [name] and lives with their spouse. she has 2 children. she reports that she is socially active and does interact with friends/family regularly. she is minimally physically active and enjoys reading.  Patient Care Team: Karla [name] as PCP - General (Family Medicine)  Advanced Directives 11/16/2018  Does Patient Have a Medical Advance Directive? No  Would patient like information on creating a medical advance directive? Yes (MAU/Ambulatory/Procedural Areas - Information given)    Hospital Utilization Over the Past 12 Months: # of hospitalizations or ER visits: 0 # of surgeries: 0  Review of Systems    Patient reports that her overall health is unchanged compared to last year.  Patient Reported Readings (BP, Pulse, CBG, Weight, etc) none  Review of Systems: No complaints  All other systems negative.  Pain Assessment Pain : No/denies pain     Current Medications & Allergies (verified) Allergies as of 11/16/2018   No Known Allergies     Medication List       Accurate as of November 16, 2018  1:37 PM. If you have any questions, ask your nurse or doctor.        calcium carbonate 1250 MG capsule Take 250 mg by mouth daily.    Dexilant 60 MG capsule Generic drug: dexlansoprazole Take 60 mg by mouth daily.   estradiol 0.1 MG/GM vaginal cream Commonly known as: ESTRACE Place 1 Applicatorful vaginally 2 (two) times a week.   famotidine 40 MG tablet Commonly known as: PEPCID Take by mouth.   Restasis 0.05 % ophthalmic emulsion Generic drug: cycloSPORINE   rosuvastatin 5 MG tablet Commonly known as: CRESTOR TAKE 0.5 TAB BY MOUTH ON MON, WED AND FRI EVENINGS.   Synthroid 88 MCG tablet Generic drug: levothyroxine TAKE 1 TABLET (88 MCG TOTAL) BY MOUTH DAILY BEFORE BREAKFAST.   Vitamin D 50 MCG (2000 UT) Caps Take 1 capsule by mouth daily.       History (reviewed): Past Medical History:  Diagnosis Date  . GERD (gastroesophageal reflux disease)   . Thyroid disease    Past Surgical History:  Procedure Laterality Date  . CERVICAL SPINE SURGERY     herniated disc   Family History  Problem Relation Age of Onset  . Hypertension Mother   . Hyperlipidemia Mother   . Hypertension Father    Social History   Socioeconomic History  . Marital status: Married    Spouse name: Karla [name]   . Number of children: 2  . Years of education: 2914  . Highest education level: Associate degree: academic program  Occupational History  . Occupation: [name]  Engineer, productionocial Needs  . Financial resource strain: Not hard at all  . Food insecurity    Worry: Never true    Inability: Never true  . Transportation needs    Medical: No  Non-medical: No  Tobacco Use  . Smoking status: Never Smoker  . Smokeless tobacco: Never Used  Substance and Sexual Activity  . Alcohol use: No  . Drug use: No  . Sexual activity: Yes  Lifestyle  . Physical activity    Days per week: 7 days    Minutes per session: 30 min  . Stress: Not at all  Relationships  . Social connections    Talks on phone: More than three times a week    Gets together: More than three times a week    Attends religious service: 1 to 4 times per year     Active member of club or organization: No    Attends meetings of clubs or organizations: Never    Relationship status: Married  Other Topics Concern  . Not on file  Social History Narrative  . Not on file    Activities of Daily Living In your present state of health, do you have any difficulty performing the following activities: 11/16/2018  Hearing? N  Vision? N  Difficulty concentrating or making decisions? N  Walking or climbing stairs? N  Dressing or bathing? N  Doing errands, shopping? N  Preparing Food and eating ? N  Using the Toilet? N  In the past six months, have you accidently leaked urine? N  Do you have problems with loss of bowel control? N  Managing your Medications? N  Managing your Finances? N  Housekeeping or managing your Housekeeping? N  Some recent data might be hidden    Patient Education/ Literacy How often do you need to have someone help you when you read instructions, pamphlets, or other written materials from your doctor or pharmacy?: 1 - Never What is the last grade level you completed in school?: Associates Degree  Exercise Current Exercise Habits: Home exercise routine, Type of exercise: walking, Time (Minutes): 30, Frequency (Times/Week): 7, Weekly Exercise (Minutes/Week): 210, Intensity: Mild, Exercise limited by: None identified  Diet Patient reports consuming 3 meals a day and 0 snack(s) a day Patient reports that her primary diet is: Regular Patient reports that she does have regular access to food.   Depression Screen PHQ 2/9 Scores 11/16/2018 02/04/2018 08/06/2017 02/04/2017 12/01/2016 07/31/2016 07/07/2016  PHQ - 2 Score 0 0 0 0 0 0 0     Fall Risk Fall Risk  11/16/2018 02/04/2018 08/06/2017 02/04/2017 12/01/2016  Falls in the past year? 0 0 No No No  Number falls in past yr: 0 - - - -  Injury with Fall? 0 - - - -     Objective:  Karla [name] seemed alert and oriented and she participated appropriately during our telephone visit.   Blood Pressure Weight BMI  BP Readings from Last 3 Encounters:  02/04/18 (!) 124/59  08/06/17 136/60  02/04/17 124/64   Wt Readings from Last 3 Encounters:  11/16/18 128 lb (58.1 kg)  02/04/18 128 lb (58.1 kg)  08/06/17 127 lb (57.6 kg)   BMI Readings from Last 1 Encounters:  11/16/18 23.41 kg/m    *Unable to obtain current vital signs, weight, and BMI due to telephone visit type  Hearing/Vision  . Lucelia did not seem to have difficulty with hearing/understanding during the telephone conversation . Reports that she has had a formal eye exam by an eye care professional within the past year . Reports that she has not had a formal hearing evaluation within the past year *Unable to fully assess hearing and vision during telephone visit type  Cognitive Function: 6CIT Screen 11/16/2018  What Year? 0 points  What month? 0 points  What time? 0 points  Count back from 20 0 points  Months in reverse 0 points  Repeat phrase 0 points  Total Score 0   (Normal:0-7, Significant for Dysfunction: >8)  Normal Cognitive Function Screening: Yes   Immunization & Health Maintenance Record Immunization History  Administered Date(s) Administered  . Influenza, High Dose Seasonal PF 01/04/2018  . Influenza,inj,Quad PF,6+ Mos 01/04/2014, 12/22/2014, 12/19/2015, 01/08/2017  . Pneumococcal Conjugate-13 01/04/2014  . Pneumococcal Polysaccharide-23 04/08/2018  . Tdap 12/01/2013  . Zoster 06/05/2014    Health Maintenance  Topic Date Due  . DEXA SCAN  07/15/2018  . INFLUENZA VACCINE  10/23/2018  . MAMMOGRAM  07/16/2019  . COLONOSCOPY  05/04/2022  . TETANUS/TDAP  12/02/2023  . Hepatitis C Screening  Completed  . PNA vac Low Risk Adult  Completed       Assessment  This is a routine wellness examination for Karla [name].  Health Maintenance: Due or Overdue Health Maintenance Due  Topic Date Due  . DEXA SCAN  07/15/2018  . INFLUENZA VACCINE  10/23/2018    Karla [name] does not need a  referral for Community Assistance: Care Management:   no Social Work:    no Prescription Assistance:  no Nutrition/Diabetes Education:  no   Plan:  Personalized Goals Goals Addressed            This Visit's Progress   . Exercise 3x per week (30 min per time)       Continue to exercise for at least 30 minutes, 3 times weekly.       Personalized Health Maintenance & Screening Recommendations  Influenza vaccine Bone densitometry screening  Lung Cancer Screening Recommended: no (Low Dose CT Chest recommended if Age 36-80 years, 30 pack-year currently smoking OR have quit w/in past 15 years) Hepatitis C Screening recommended: no HIV Screening recommended: no  Advanced Directives: Written information was prepared per patient's request.  Referrals & Orders No orders of the defined types were placed in this encounter.   Follow-up Plan . Follow-up with Karla [name] as planned    I have personally reviewed and noted the following in the patient's chart:   . Medical and social history . Use of alcohol, tobacco or illicit drugs  . Current medications and supplements . Functional ability and status . Nutritional status . Physical activity . Advanced directives . List of other physicians . Hospitalizations, surgeries, and ER visits in previous 12 months . Vitals . Screenings to include cognitive, depression, and falls . Referrals and appointments  In addition, I have reviewed and discussed with Karla [name] certain preventive protocols, quality metrics, and best practice recommendations. A written personalized care plan for preventive services as well as general preventive health recommendations is available and can be mailed to the patient at her request.      Caryl BisChanda M Linn Clavin, LPN  [id number]/25/2020  I have reviewed and agree with the above AWV documentation.   Mary-Margaret Daphine DeutscherMartin, [name]

## 2018-11-17 ENCOUNTER — Other Ambulatory Visit: Payer: Self-pay | Admitting: Gynecology

## 2018-11-17 DIAGNOSIS — E2839 Other primary ovarian failure: Secondary | ICD-10-CM

## 2018-11-17 DIAGNOSIS — Z1231 Encounter for screening mammogram for malignant neoplasm of breast: Secondary | ICD-10-CM

## 2018-11-30 ENCOUNTER — Ambulatory Visit (INDEPENDENT_AMBULATORY_CARE_PROVIDER_SITE_OTHER): Payer: Medicare Other | Admitting: *Deleted

## 2018-11-30 ENCOUNTER — Other Ambulatory Visit: Payer: Self-pay

## 2018-11-30 DIAGNOSIS — E538 Deficiency of other specified B group vitamins: Secondary | ICD-10-CM

## 2018-11-30 NOTE — Progress Notes (Signed)
Pt given Cyanocobalamin inj Tolerated well 

## 2018-12-29 ENCOUNTER — Other Ambulatory Visit: Payer: Self-pay

## 2018-12-30 ENCOUNTER — Ambulatory Visit (INDEPENDENT_AMBULATORY_CARE_PROVIDER_SITE_OTHER): Payer: Medicare Other

## 2018-12-30 DIAGNOSIS — E538 Deficiency of other specified B group vitamins: Secondary | ICD-10-CM

## 2018-12-30 DIAGNOSIS — Z23 Encounter for immunization: Secondary | ICD-10-CM | POA: Diagnosis not present

## 2018-12-30 NOTE — Progress Notes (Signed)
Cyanocobalamin injection given to right deltoid.  Patient tolerated well. 

## 2019-01-25 ENCOUNTER — Other Ambulatory Visit: Payer: Self-pay

## 2019-01-25 ENCOUNTER — Ambulatory Visit
Admission: RE | Admit: 2019-01-25 | Discharge: 2019-01-25 | Disposition: A | Discharge status: Home / Self Care | Payer: 59 | Source: Ambulatory Visit | Attending: Gynecology | Admitting: Gynecology

## 2019-01-25 ENCOUNTER — Ambulatory Visit
Admission: RE | Admit: 2019-01-25 | Discharge: 2019-01-25 | Disposition: A | Discharge status: Home / Self Care | Payer: Medicare Other | Source: Ambulatory Visit | Attending: Gynecology | Admitting: Gynecology

## 2019-01-25 DIAGNOSIS — E2839 Other primary ovarian failure: Secondary | ICD-10-CM

## 2019-01-25 DIAGNOSIS — Z1231 Encounter for screening mammogram for malignant neoplasm of breast: Secondary | ICD-10-CM

## 2019-01-26 ENCOUNTER — Encounter: Payer: Self-pay | Admitting: Family Medicine

## 2019-02-02 ENCOUNTER — Other Ambulatory Visit: Payer: Medicare Other

## 2019-02-02 DIAGNOSIS — E559 Vitamin D deficiency, unspecified: Secondary | ICD-10-CM

## 2019-02-02 DIAGNOSIS — E034 Atrophy of thyroid (acquired): Secondary | ICD-10-CM

## 2019-02-02 DIAGNOSIS — E538 Deficiency of other specified B group vitamins: Secondary | ICD-10-CM

## 2019-02-02 DIAGNOSIS — E7800 Pure hypercholesterolemia, unspecified: Secondary | ICD-10-CM

## 2019-02-03 ENCOUNTER — Other Ambulatory Visit: Payer: Self-pay | Admitting: Family Medicine

## 2019-02-03 LAB — THYROID PANEL WITH TSH
Free Thyroxine Index: 3.1 (ref 1.2–4.9)
T3 Uptake Ratio: 29 % (ref 24–39)
T4, Total: 10.7 ug/dL (ref 4.5–12.0)
TSH: 3.78 u[IU]/mL (ref 0.450–4.500)

## 2019-02-03 LAB — CBC WITH DIFFERENTIAL/PLATELET
Basophils Absolute: 0.1 10*3/uL (ref 0.0–0.2)
Basos: 1 %
EOS (ABSOLUTE): 0.2 10*3/uL (ref 0.0–0.4)
Eos: 3 %
Hematocrit: 40.3 % (ref 34.0–46.6)
Hemoglobin: 12.9 g/dL (ref 11.1–15.9)
Immature Grans (Abs): 0 10*3/uL (ref 0.0–0.1)
Immature Granulocytes: 0 %
Lymphocytes Absolute: 2.6 10*3/uL (ref 0.7–3.1)
Lymphs: 47 %
MCH: 31.9 pg (ref 26.6–33.0)
MCHC: 32 g/dL (ref 31.5–35.7)
MCV: 100 fL — ABNORMAL HIGH (ref 79–97)
Monocytes Absolute: 0.4 10*3/uL (ref 0.1–0.9)
Monocytes: 7 %
Neutrophils Absolute: 2.4 10*3/uL (ref 1.4–7.0)
Neutrophils: 42 %
Platelets: 231 10*3/uL (ref 150–450)
RBC: 4.04 x10E6/uL (ref 3.77–5.28)
RDW: 12.2 % (ref 11.7–15.4)
WBC: 5.6 10*3/uL (ref 3.4–10.8)

## 2019-02-03 LAB — CMP14+EGFR
ALT: 16 IU/L (ref 0–32)
AST: 27 IU/L (ref 0–40)
Albumin/Globulin Ratio: 2 (ref 1.2–2.2)
Albumin: 4.7 g/dL (ref 3.8–4.8)
Alkaline Phosphatase: 58 IU/L (ref 39–117)
BUN/Creatinine Ratio: 16 (ref 12–28)
BUN: 13 mg/dL (ref 8–27)
Bilirubin Total: 1.2 mg/dL (ref 0.0–1.2)
CO2: 25 mmol/L (ref 20–29)
Calcium: 9.3 mg/dL (ref 8.7–10.3)
Chloride: 103 mmol/L (ref 96–106)
Creatinine, Ser: 0.79 mg/dL (ref 0.57–1.00)
GFR calc Af Amer: 90 mL/min/{1.73_m2} (ref >59)
GFR calc non Af Amer: 78 mL/min/{1.73_m2} (ref >59)
Globulin, Total: 2.4 g/dL (ref 1.5–4.5)
Glucose: 81 mg/dL (ref 65–99)
Potassium: 3.8 mmol/L (ref 3.5–5.2)
Sodium: 143 mmol/L (ref 134–144)
Total Protein: 7.1 g/dL (ref 6.0–8.5)

## 2019-02-03 LAB — LIPID PANEL
Chol/HDL Ratio: 2.3 ratio (ref 0.0–4.4)
Cholesterol, Total: 128 mg/dL (ref 100–199)
HDL: 56 mg/dL (ref >39)
LDL Chol Calc (NIH): 59 mg/dL (ref 0–99)
Triglycerides: 58 mg/dL (ref 0–149)
VLDL Cholesterol Cal: 13 mg/dL (ref 5–40)

## 2019-02-03 LAB — VITAMIN D 25 HYDROXY (VIT D DEFICIENCY, FRACTURES): Vit D, 25-Hydroxy: 57.2 ng/mL (ref 30.0–100.0)

## 2019-02-03 LAB — VITAMIN B12: Vitamin B-12: 868 pg/mL (ref 232–1245)

## 2019-02-07 ENCOUNTER — Ambulatory Visit: Payer: Medicare Other | Admitting: Family Medicine

## 2019-02-09 ENCOUNTER — Ambulatory Visit (INDEPENDENT_AMBULATORY_CARE_PROVIDER_SITE_OTHER): Payer: Medicare Other | Admitting: Family Medicine

## 2019-02-09 ENCOUNTER — Ambulatory Visit (INDEPENDENT_AMBULATORY_CARE_PROVIDER_SITE_OTHER): Payer: Medicare Other

## 2019-02-09 ENCOUNTER — Other Ambulatory Visit: Payer: Self-pay

## 2019-02-09 ENCOUNTER — Encounter: Payer: Self-pay | Admitting: Family Medicine

## 2019-02-09 VITALS — BP 137/72 | HR 69 | Temp 98.7°F | Resp 20 | Ht 62.0 in | Wt 126.0 lb

## 2019-02-09 DIAGNOSIS — Z780 Asymptomatic menopausal state: Secondary | ICD-10-CM

## 2019-02-09 DIAGNOSIS — E538 Deficiency of other specified B group vitamins: Secondary | ICD-10-CM

## 2019-02-09 DIAGNOSIS — M8580 Other specified disorders of bone density and structure, unspecified site: Secondary | ICD-10-CM | POA: Diagnosis not present

## 2019-02-09 DIAGNOSIS — M25551 Pain in right hip: Secondary | ICD-10-CM

## 2019-02-09 DIAGNOSIS — E559 Vitamin D deficiency, unspecified: Secondary | ICD-10-CM

## 2019-02-09 DIAGNOSIS — K449 Diaphragmatic hernia without obstruction or gangrene: Secondary | ICD-10-CM | POA: Insufficient documentation

## 2019-02-09 DIAGNOSIS — K219 Gastro-esophageal reflux disease without esophagitis: Secondary | ICD-10-CM

## 2019-02-09 DIAGNOSIS — E034 Atrophy of thyroid (acquired): Secondary | ICD-10-CM | POA: Diagnosis not present

## 2019-02-09 NOTE — Progress Notes (Addendum)
Subjective:  Patient ID: Karla [name], female    DOB: 10-03-1950, 68 y.o.   MRN: [id number]  Patient Care Team: Baruch Gouty, FNP as PCP - General (Family Medicine)   Chief Complaint:  Medical Management of Chronic Issues (6 mo ) and Hypothyroidism   HPI: Karla [name] is a 68 y.o. female presenting on 02/09/2019 for Medical Management of Chronic Issues (6 mo ) and Hypothyroidism   1. Gastroesophageal reflux disease without esophagitis Compliant with medications - Yes Current medications - Dexilant, Pepcid Adverse side effects - No DEXA if on PPI - DEXA scan 01/2019 - osteopenia Cough - No Sore throat - No Voice change - No Hemoptysis - No Dysphagia or dyspepsia - No Water brash - No Red Flags (weight loss, hematochezia, melena, weight loss, early satiety, fevers, odynophagia, or persistent vomiting) - No  2. Hypothyroidism due to acquired atrophy of thyroid Compliant with medications - Yes Current medications - Synthroid 88 mcg Adverse side effects - No Weight - stable  Bowel habit changes - No Heat or cold intolerance - No Mood changes - No Changes in sleep habits - No Fatigue - No Skin, hair, or nail changes - No Tremor - No Palpitations - No Edema - No Shortness of breath - No  Lab Results  Component Value Date   TSH 3.780 02/02/2019     3. Vitamin D deficiency Pt is taking oral repletion therapy. Denies bone pain and tenderness, muscle weakness, fracture, and difficulty walking. Lab Results  Component Value Date   VD25OH 57.2 02/02/2019   VD25OH 45.8 02/02/2018   VD25OH 58.6 08/03/2017   Lab Results  Component Value Date   CALCIUM 9.3 02/02/2019      4. Osteopenia after menopause Last Dexa with T score of -2.4. Pt is on calcium and vitamin D repletion.   5. Right hip pain Onset several weeks ago. Worse with movement. Comes and goes. Not significant pain. Does not interfere with daily activities but is becoming more frequent. States aching and  4/10 at worst. Has taken tylenol with relief of symptoms. No known injury. No loss of function.      Relevant past medical, surgical, family, and social history reviewed and updated as indicated.  Allergies and medications reviewed and updated. Date reviewed: Chart in Epic.   Past Medical History:  Diagnosis Date  . GERD (gastroesophageal reflux disease)   . Thyroid disease     Past Surgical History:  Procedure Laterality Date  . CERVICAL SPINE SURGERY     herniated disc    Social History   Socioeconomic History  . Marital status: Married    Spouse name: Associate Professor   . Number of children: 2  . Years of education: 37  . Highest education level: Associate degree: academic program  Occupational History  . Occupation: Retired  Scientific laboratory technician  . Financial resource strain: Not hard at all  . Food insecurity    Worry: Never true    Inability: Never true  . Transportation needs    Medical: No    Non-medical: No  Tobacco Use  . Smoking status: Never Smoker  . Smokeless tobacco: Never Used  Substance and Sexual Activity  . Alcohol use: No  . Drug use: No  . Sexual activity: Yes  Lifestyle  . Physical activity    Days per week: 7 days    Minutes per session: 30 min  . Stress: Not at all  Relationships  . Social connections  Talks on phone: More than three times a week    Gets together: More than three times a week    Attends religious service: 1 to 4 times per year    Active member of club or organization: No    Attends meetings of clubs or organizations: Never    Relationship status: Married  . Intimate partner violence    Fear of current or ex partner: No    Emotionally abused: No    Physically abused: No    Forced sexual activity: No  Other Topics Concern  . Not on file  Social History Narrative  . Not on file    Outpatient Encounter Medications as of 02/09/2019  Medication Sig  . calcium carbonate 1250 MG capsule Take 250 mg by mouth daily.   .  Cholecalciferol (VITAMIN D) 2000 UNITS CAPS Take 1 capsule by mouth daily.  Marland Kitchen DEXILANT 60 MG capsule Take 60 mg by mouth daily.  . famotidine (PEPCID) 40 MG tablet Take by mouth.  . levothyroxine (SYNTHROID) 88 MCG tablet Take 1 tablet (88 mcg total) by mouth daily before breakfast. TAKE 1 TABLET (88 MCG TOTAL) BY MOUTH DAILY BEFORE BREAKFAST.  Needs to be seen for further refills.  . RESTASIS 0.05 % ophthalmic emulsion   . rosuvastatin (CRESTOR) 5 MG tablet TAKE 0.5 TAB BY MOUTH ON MON, WED AND FRI EVENINGS.  Marland Kitchen estradiol (ESTRACE) 0.1 MG/GM vaginal cream Place 1 Applicatorful vaginally 2 (two) times a week.   Facility-Administered Encounter Medications as of 02/09/2019  Medication  . cyanocobalamin ((VITAMIN B-12)) injection 1,000 mcg    No Known Allergies  Review of Systems  Constitutional: Negative for activity change, appetite change, chills, diaphoresis, fatigue, fever and unexpected weight change.  HENT: Negative.   Eyes: Negative.  Negative for photophobia and visual disturbance.  Respiratory: Negative for cough, chest tightness and shortness of breath.   Cardiovascular: Negative for chest pain, palpitations and leg swelling.  Gastrointestinal: Positive for abdominal pain (intermittent reflux, well controlled with medications). Negative for blood in stool, constipation, diarrhea, nausea and vomiting.  Endocrine: Negative.  Negative for cold intolerance, heat intolerance, polydipsia, polyphagia and polyuria.  Genitourinary: Negative for decreased urine volume, difficulty urinating, dysuria, frequency and urgency.  Musculoskeletal: Positive for arthralgias (right hip). Negative for back pain, gait problem, joint swelling, myalgias and neck stiffness.  Skin: Negative.  Negative for rash.  Allergic/Immunologic: Negative.   Neurological: Negative for dizziness, tremors, seizures, syncope, facial asymmetry, speech difficulty, weakness, light-headedness, numbness and headaches.   Hematological: Negative.   Psychiatric/Behavioral: Negative for confusion, hallucinations, sleep disturbance and suicidal ideas.  All other systems reviewed and are negative.       Objective:  BP 137/72   Pulse 69   Temp 98.7 F (37.1 C)   Resp 20   Ht _0  (1.575 m)   Wt 126 lb (57.2 kg)   SpO2 100%   BMI 23.05 kg/m    Wt Readings from Last 3 Encounters:  02/09/19 126 lb (57.2 kg)  11/16/18 128 lb (58.1 kg)  02/04/18 128 lb (58.1 kg)    Physical Exam Vitals signs and nursing note reviewed.  Constitutional:      General: She is not in acute distress.    Appearance: Normal appearance. She is well-developed and well-groomed. She is not ill-appearing, toxic-appearing or diaphoretic.  HENT:     Head: Normocephalic and atraumatic.     Jaw: There is normal jaw occlusion.     Right Ear: Hearing normal.  Left Ear: Hearing normal.     Nose: Nose normal.     Mouth/Throat:     Lips: Pink.     Mouth: Mucous membranes are moist.     Pharynx: Oropharynx is clear. Uvula midline.  Eyes:     General: Lids are normal.     Extraocular Movements: Extraocular movements intact.     Conjunctiva/sclera: Conjunctivae normal.     Pupils: Pupils are equal, round, and reactive to light.  Neck:     Musculoskeletal: Normal range of motion and neck supple.     Thyroid: No thyroid mass, thyromegaly or thyroid tenderness.     Vascular: No carotid bruit or JVD.     Trachea: Trachea and phonation normal.  Cardiovascular:     Rate and Rhythm: Normal rate and regular rhythm.     Chest Wall: PMI is not displaced.     Pulses: Normal pulses.     Heart sounds: Normal heart sounds. No murmur. No friction rub. No gallop.   Pulmonary:     Effort: Pulmonary effort is normal. No respiratory distress.     Breath sounds: Normal breath sounds. No wheezing.  Abdominal:     General: Bowel sounds are normal. There is no distension or abdominal bruit.     Palpations: Abdomen is soft. There is no  hepatomegaly or splenomegaly.     Tenderness: There is no abdominal tenderness. There is no right CVA tenderness or left CVA tenderness.     Hernia: No hernia is present.  Musculoskeletal: Normal range of motion.        General: No swelling, tenderness, deformity or signs of injury.     Right lower leg: No edema.     Left lower leg: No edema.  Lymphadenopathy:     Cervical: No cervical adenopathy.  Skin:    General: Skin is warm and dry.     Capillary Refill: Capillary refill takes less than 2 seconds.     Coloration: Skin is not cyanotic, jaundiced or pale.     Findings: No rash.  Neurological:     General: No focal deficit present.     Mental Status: She is alert and oriented to person, place, and time.     Cranial Nerves: Cranial nerves are intact. No cranial nerve deficit.     Sensory: Sensation is intact. No sensory deficit.     Motor: Motor function is intact. No weakness.     Coordination: Coordination is intact. Coordination normal.     Gait: Gait is intact. Gait normal.     Deep Tendon Reflexes: Reflexes are normal and symmetric. Reflexes normal.  Psychiatric:        Attention and Perception: Attention and perception normal.        Mood and Affect: Mood and affect normal.        Speech: Speech normal.        Behavior: Behavior normal. Behavior is cooperative.        Thought Content: Thought content normal.        Cognition and Memory: Cognition and memory normal.        Judgment: Judgment normal.     Results for orders placed or performed in visit on 02/02/19  CBC with Differential/Platelet  Result Value Ref Range   WBC 5.6 3.4 - 10.8 x10E3/uL   RBC 4.04 3.77 - 5.28 x10E6/uL   Hemoglobin 12.9 11.1 - 15.9 g/dL   Hematocrit 40.3 34.0 - 46.6 %   MCV 100 (H) 79 -  97 fL   MCH 31.9 26.6 - 33.0 pg   MCHC 32.0 31.5 - 35.7 g/dL   RDW 12.2 11.7 - 15.4 %   Platelets 231 150 - 450 x10E3/uL   Neutrophils 42 Not Estab. %   Lymphs 47 Not Estab. %   Monocytes 7 Not Estab. %    Eos 3 Not Estab. %   Basos 1 Not Estab. %   Neutrophils Absolute 2.4 1.4 - 7.0 x10E3/uL   Lymphocytes Absolute 2.6 0.7 - 3.1 x10E3/uL   Monocytes Absolute 0.4 0.1 - 0.9 x10E3/uL   EOS (ABSOLUTE) 0.2 0.0 - 0.4 x10E3/uL   Basophils Absolute 0.1 0.0 - 0.2 x10E3/uL   Immature Granulocytes 0 Not Estab. %   Immature Grans (Abs) 0.0 0.0 - 0.1 x10E3/uL  CMP14+EGFR  Result Value Ref Range   Glucose 81 65 - 99 mg/dL   BUN 13 8 - 27 mg/dL   Creatinine, Ser 0.79 0.57 - 1.00 mg/dL   GFR calc non Af Amer 78 >59 mL/min/1.73   GFR calc Af Amer 90 >59 mL/min/1.73   BUN/Creatinine Ratio 16 12 - 28   Sodium 143 134 - 144 mmol/L   Potassium 3.8 3.5 - 5.2 mmol/L   Chloride 103 96 - 106 mmol/L   CO2 25 20 - 29 mmol/L   Calcium 9.3 8.7 - 10.3 mg/dL   Total Protein 7.1 6.0 - 8.5 g/dL   Albumin 4.7 3.8 - 4.8 g/dL   Globulin, Total 2.4 1.5 - 4.5 g/dL   Albumin/Globulin Ratio 2.0 1.2 - 2.2   Bilirubin Total 1.2 0.0 - 1.2 mg/dL   Alkaline Phosphatase 58 39 - 117 IU/L   AST 27 0 - 40 IU/L   ALT 16 0 - 32 IU/L  Lipid panel  Result Value Ref Range   Cholesterol, Total 128 100 - 199 mg/dL   Triglycerides 58 0 - 149 mg/dL   HDL 56 >39 mg/dL   VLDL Cholesterol Cal 13 5 - 40 mg/dL   LDL Chol Calc (NIH) 59 0 - 99 mg/dL   Chol/HDL Ratio 2.3 0.0 - 4.4 ratio  Vitamin B12  Result Value Ref Range   Vitamin B-12 868 232 - 1,245 pg/mL  Vitamin D 25 hydroxy  Result Value Ref Range   Vit D, 25-Hydroxy 57.2 30.0 - 100.0 ng/mL  Thyroid Panel With TSH  Result Value Ref Range   TSH 3.780 0.450 - 4.500 uIU/mL   T4, Total 10.7 4.5 - 12.0 ug/dL   T3 Uptake Ratio 29 24 - 39 %   Free Thyroxine Index 3.1 1.2 - 4.9     X-Ray: right hip: very mild degenerative changes No acute findings. Preliminary x-ray reading by Monia Pouch, FNP-C, [facility].   Pertinent labs & imaging results that were available during my care of the patient were reviewed by me and considered in my medical decision making.  Assessment & Plan:   Zienna was seen today for medical management of chronic issues and hypothyroidism.  Diagnoses and all orders for this visit:  Gastroesophageal reflux disease without esophagitis No red flags present. Diet discussed. Avoid fried, spicy, fatty, greasy, and acidic foods. Avoid caffeine, nicotine, and alcohol. Do not eat 2-3 hours before bedtime and stay upright for at least 1-2 hours after eating. Eat small frequent meals. Avoid NSAID's like motrin and aleve. Medications as prescribed. Report any new or worsening symptoms. Follow up as discussed or sooner if needed.    Hypothyroidism due to acquired atrophy of thyroid Thyroid disease  has been well controlled. Labs are pending. Adjustments to regimen will be made if warranted. Make sure to take medications on an empty stomach with a full glass of water. Make sure to avoid vitamins or supplements for at least 4 hours before and 4 hours after taking medications. Repeat labs in 3 months if adjustments are made and in 6 months if stable.    Vitamin D deficiency Labs pending. Continue repletion therapy. If indicated, will change repletion dosage. Eat foods rich in Vit D including milk, orange juice, yogurt with vitamin D added, salmon or mackerel, canned tuna fish, cereals with vitamin D added, and cod liver oil. Get out in the sun but make sure to wear at least SPF 30 sunscreen.   Osteopenia after menopause Continue calcium and vit D repletion. Discussed weight bearing exercises.   Right hip pain Degenerative changes. No acute abnormalities. Discussed treatment of arthritis in detail. Will trial OTC Voltaren gel. Report any new or worsening symptoms. Will notify if radiology reading differs.  -     DG HIP UNILAT WITH PELVIS 2-3 VIEWS RIGHT; Future     Continue all other maintenance medications.  Follow up plan: Return in about 6 months (around 08/09/2019), or if symptoms worsen or fail to improve.  Continue healthy lifestyle choices, including  diet (rich in fruits, vegetables, and lean proteins, and low in salt and simple carbohydrates) and exercise (at least 30 minutes of moderate physical activity daily).  Educational handout given for hip pain, osteopenia  The above assessment and management plan was discussed with the patient. The patient verbalized understanding of and has agreed to the management plan. Patient is aware to call the clinic if they develop any new symptoms or if symptoms persist or worsen. Patient is aware when to return to the clinic for a follow-up visit. Patient educated on when it is appropriate to go to the emergency department.   Monia Pouch, FNP-C [facility] [phone]

## 2019-02-09 NOTE — Patient Instructions (Addendum)
Hip Pain  The hip is the joint between the upper legs and the lower pelvis. The bones, cartilage, tendons, and muscles of your hip joint support your body and allow you to move around. Hip pain can range from a minor ache to severe pain in one or both of your hips. The pain may be felt on the inside of the hip joint near the groin, or the outside near the buttocks and upper thigh. You may also have swelling or stiffness. Follow these instructions at home: Managing pain, stiffness, and swelling  If directed, apply ice to the injured area. ? Put ice in a plastic bag. ? Place a towel between your skin and the bag. ? Leave the ice on for 20 minutes, 2-3 times a day  Sleep with a pillow between your legs on your most comfortable side.  Avoid any activities that cause pain. General instructions  Take over-the-counter and prescription medicines only as told by your health care provider.  Do any exercises as told by your health care provider.  Record the following: ? How often you have hip pain. ? The location of your pain. ? What the pain feels like. ? What makes the pain worse.  Keep all follow-up visits as told by your health care provider. This is important. Contact a health care provider if:  You cannot put weight on your leg.  Your pain or swelling continues or gets worse after one week.  It gets harder to walk.  You have a fever. Get help right away if:  You fall.  You have a sudden increase in pain and swelling in your hip.  Your hip is red or swollen or very tender to touch. Summary  Hip pain can range from a minor ache to severe pain in one or both of your hips.  The pain may be felt on the inside of the hip joint near the groin, or the outside near the buttocks and upper thigh.  Avoid any activities that cause pain.  Record how often you have hip pain, the location of the pain, what makes it worse and what it feels like. This information is not intended to  replace advice given to you by your health care provider. Make sure you discuss any questions you have with your health care provider. Document Released: 08/28/2009 Document Revised: 02/20/2017 Document Reviewed: 02/11/2016 Elsevier Patient Education  2020 [employer].  Your recent bone density scan demonstrated that you have osteopenia.  Osteopenia is when your bones become thinner and weaker.  This is not osteoporosis but can become osteoporosis over time.  Today, I provided you a handout reviewing calcium and vitamin D and ways to incorporate this in your diet.  You may need to take a supplement if you are unable to get sufficient amount of these minerals through food.  Strength training is just as important in maintaining bone health.  A copy of home exercises has been provided to you. You need to repeat your bone density in 2 years.   Exercise for Strong Bones  Exercise is important to build and maintain strong bones / bone density.  There are 2 types of exercises that are important to building and maintaining strong bones:  Weight- bearing and muscle-stregthening.  Weight-bearing Exercises  These exercises include activities that make you move against gravity while staying upright. Weight-bearing exercises can be high-impact or low-impact.  High-impact weight-bearing exercises help build bones and keep them strong. If you have broken a bone due  to osteoporosis or are at risk of breaking a bone, you may need to avoid high-impact exercises. If you're not sure, you should check with your healthcare provider.  Examples of high-impact weight-bearing exercises are: . Dancing  . Doing high-impact aerobics  . Hiking  . Jogging/running  . Jumping Rope  . Stair climbing  . Tennis  Low-impact weight-bearing exercises can also help keep bones strong and are a safe alternative if you cannot do high-impact exercises.   Examples of low-impact weight-bearing exercises are: . Using elliptical  training machines  . Doing low-impact aerobics  . Using stair-step machines  . Fast walking on a treadmill or outside   Muscle-Strengthening Exercises These exercises include activities where you move your body, a weight or some other resistance against gravity. They are also known as resistance exercises and include: . Lifting weights  . Using elastic exercise bands  . Using weight machines  . Lifting your own body weight  . Functional movements, such as standing and rising up on your toes  Yoga and Pilates can also improve strength, balance and flexibility. However, certain positions may not be safe for people with osteoporosis or those at increased risk of broken bones. For example, exercises that have you bend forward may increase the chance of breaking a bone in the spine.   Non-Impact Exercises There are other types of exercises that can help prevent falls.  Non-impact exercises can help you to improve balance, posture and how well you move in everyday activities. Some of these exercises include: . Balance exercises that strengthen your legs and test your balance, such as Tai Chi, can decrease your risk of falls.  . Posture exercises that improve your posture and reduce rounded or "sloping" shoulders can help you decrease the chance of breaking a bone, especially in the spine.  . Functional exercises that improve how well you move can help you with everyday activities and decrease your chance of falling and breaking a bone. For example, if you have trouble getting up from a chair or climbing stairs, you should do these activities as exercises.   A physical therapist can teach you balance, posture and functional exercises. He/she can also help you learn which exercises are safe and appropriate for you.  [facility] has a physical therapy office in [place] in front of our office and referrals can be made for assessments and treatment as needed and strength and balance training.  If you  would like to have an assessment with [facility] and our physical therapy team please let a nurse or provider know.  For more information go to the [facility] website: [email or web address]   . Click the striped box in the right upper corner.   . Then click through patient info in the left lower hand corner to out more about osteoporosis and osteopenia and how you can prevent fractures.   Follow these instructions at home:  Include calcium and vitamin D in your diet. Calcium is important for bone health, and vitamin D helps the body absorb calcium. Os-Cal is a great over the counter supplement to take daily. It has calcium and Vitamin D.   Perform weight-bearing and muscle-strengthening exercises as directed by your health care provider.   Do not use any tobacco products, including cigarettes, chewing tobacco, and electronic cigarettes. If you need help quitting, ask your health care provider.  Limit your alcohol intake.  Take medicines only as directed by your health care provider.  Keep all follow-up visits  as directed by your health care provider. This is important.  Take precautions at home to lower your risk of falling, such as: ? Keeping rooms well lit and clutter free. ? Installing safety rails on stairs. ? Using rubber mats in the bathroom and other areas that are often wet or slippery.     Calcium & Vitamin D: The Facts  Why is calcium and vitamin D consumption important? Calcium: . Most Americans do not consume adequate amounts of calcium! Calcium is required for proper muscle function, nerve communication, bone support, and many other functions in the body.  . The body uses bones as a source of calcium. Bones 'remodel' themselves continuously - the body constantly breaks bone down to release calcium and rebuilds bones by replacing calcium in the bone later.  . As we get older, the rate of bone breakdown occurs faster than bone rebuilding which could lead  to osteopenia, osteoporosis, and possible fractures.   Vitamin D: . People naturally make vitamin D in the body when sunlight hits the skin and triggers a process that leads to vitamin D production. This natural vitamin D production requires about 10-15 minutes of sun exposure on the hands, arms, and face at least 2-3 times per week. However, due to decreased sun exposure and the use of sunscreen, most people will need to get additional vitamin D from foods or supplements. Your doctor can measure your body's vitamin D level through a simple blood test to determine your daily vitamin D needs.  . Vitamin D is used to help the body absorb calcium, maintain bone health, help the immune system, and reduce inflammation. It also plays a role in muscle performance, balance and risk of falling.  . Vitamin D deficiency can lead to osteomalacia or softening of the bones, bone pain, and muscle weakness.   The recommended daily allowance of Calcium and Vitamin D varies for different age groups. Age group Calcium (mg) Vitamin D (IU)  Females and Males: Age 76-50 1000 mg 600 IU  Females: Age 56- 52 1200 mg 600 IU  Males: Age 89-70 1000 mg 600 IU  Females and Males: Age 76+ 1200 mg 800 IU  Pregnant/lactating Females age 37-50 1000 mg 600 IU   How much Calcium do you get in your diet? Calcium Intake # of servings per day  Total calcium (mg)  Skim milk, 2% milk (1 cup) _________ x 300 mg   Yogurt (1 small container) _________ x 200 mg   Cheese (1oz) _________ x 200 mg   Cottage Cheese (1 cup)             ________ x 150 mg   Almond milk (1 cup) _________ x 450 mg   Fortified Orange Juice (1 cup) _________ x 300 mg   Broccoli or spinach ( 1 cup) _________ x 100 mg   Salmon (3 oz) _________ x 150 mg    Almonds (1/4 cup) _______ x 90 mg      How do we get Calcium and Vitamin D in our diet? Calcium: . Obtaining calcium from the diet is the most preferred way to reach the recommended daily goal. If this goal  is not reached through diet, calcium supplements are available.  . Calcium is found in many foods including: dairy products, dark leafy vegetables (like broccoli, kale, and spinach), fish, and fortified products like juices and cereals.  . The food label will have a %DV (percent daily value) listed showing the amount of calcium per  serving. To determine the total mg per serving, simply replace the % with zero (0).  For example, Almond Breeze almond milk contains 45% DV of calcium or 443m per 1 cup.  . You can increase the amount of calcium in your diet by using more calcium products in your daily meals. Use yogurt and fruit to make smoothies or use yogurt to top baked potatoes or make whipped potatoes. Sprinkle low fat cheese onto salads or into egg white omelets. You can even add non-fat dry milk powder (30110mcalcium per 1/3 cup) to hot cereals, meat loaf, soups, or potatoes.  . Calcium supplements come in many forms including tablets, chewables, and gummies. Be sure to read the label to determine the correct number of tablets per serving and whether or not to take the supplement with food.  . Calcium carbonate products (Oscal, Caltrate, and Viactiv) are generally better absorbed when taken with food while calcium citrate products like Citracal can be taken with or without food.  . The body can only absorb about 600 mg of calcium at one time. It is recommended to take calcium supplements in small amounts several times per day.  However, taking it all at once is better than not taking it at all. . Increasing your intake of calcium is essential for bone health, but may also lead to some side effects like constipation, increased gas, bloating or abdominal cramping. To help reduce these side effects, start with 1 tablet per day and slowly increase your intake of the supplement to the recommended doses. It is also recommended that you drink plenty of water each day. Vitamin D: . Very few foods naturally  contain vitamin D. However, it is found in saltwater fish (like tuna, salmon and mackerel), beef liver, egg yolks, cheese and vitamin D fortified foods (like yogurt, cereals, orange juice and milk) . The amount of vitamin D in each food or product is listed as %DV on the product label. To determine the total amount of vitamin D per serving, drop the % sign and multiply the number by 4. For example, 1 cup of Almond Breeze almond milk contains 25% DV vitamin D or 100 IU per serving (25 x 4 =100). . Vitamin D is also found in multivitamins and supplements and may be listed as ergocalciferol (vitamin D2) or cholecalciferol (vitamin D3). Each of these forms of vitamin D are equivalent and the daily recommended intake will vary based on your age and the vitamin D levels in your body. Follow your doctor's recommendation for vitamin D intake.

## 2019-03-10 ENCOUNTER — Other Ambulatory Visit: Payer: Self-pay | Admitting: Family Medicine

## 2019-03-10 ENCOUNTER — Other Ambulatory Visit: Payer: Self-pay

## 2019-03-11 ENCOUNTER — Ambulatory Visit (INDEPENDENT_AMBULATORY_CARE_PROVIDER_SITE_OTHER): Payer: Medicare Other

## 2019-03-11 ENCOUNTER — Other Ambulatory Visit: Payer: Self-pay | Admitting: Family Medicine

## 2019-03-11 DIAGNOSIS — E538 Deficiency of other specified B group vitamins: Secondary | ICD-10-CM | POA: Diagnosis not present

## 2019-03-15 ENCOUNTER — Encounter: Payer: Self-pay | Admitting: Family Medicine

## 2019-04-12 ENCOUNTER — Other Ambulatory Visit: Payer: Self-pay

## 2019-04-12 ENCOUNTER — Ambulatory Visit (INDEPENDENT_AMBULATORY_CARE_PROVIDER_SITE_OTHER): Payer: Medicare Other | Admitting: *Deleted

## 2019-04-12 DIAGNOSIS — E538 Deficiency of other specified B group vitamins: Secondary | ICD-10-CM | POA: Diagnosis not present

## 2019-04-13 ENCOUNTER — Ambulatory Visit: Payer: Medicare Other | Attending: Internal Medicine

## 2019-04-13 DIAGNOSIS — Z23 Encounter for immunization: Secondary | ICD-10-CM | POA: Insufficient documentation

## 2019-04-13 NOTE — Progress Notes (Signed)
   Covid-19 Vaccination Clinic  Name:  Karla [name]    MRN: [id number] DOB: [birth year]  04/13/2019  Karla [name] was observed post Covid-19 immunization for 15 minutes without incidence. She was provided with Vaccine Information Sheet and instruction to access the V-Safe system.   Karla [name] was instructed to call 911 with any severe reactions post vaccine: Marland Kitchen Difficulty breathing  . Swelling of your face and throat  . A fast heartbeat  . A bad rash all over your body  . Dizziness and weakness    Immunizations Administered    Name Date Dose VIS Date Route   Pfizer COVID-19 Vaccine 04/13/2019 10:46 AM 0.3 mL 03/04/2019 Intramuscular   Manufacturer: [employer], [employer]   Lot: [id number]   NDC: [address]-7

## 2019-05-02 ENCOUNTER — Ambulatory Visit: Payer: Medicare Other | Attending: Internal Medicine

## 2019-05-02 DIAGNOSIS — Z23 Encounter for immunization: Secondary | ICD-10-CM | POA: Insufficient documentation

## 2019-05-02 NOTE — Progress Notes (Signed)
   Covid-19 Vaccination Clinic  Name:  Stepheni Cameron    MRN: [id number] DOB: [birth year]  05/02/2019  Ms. Nydam was observed post Covid-19 immunization for 15 minutes without incidence. She was provided with Vaccine Information Sheet and instruction to access the V-Safe system.   Ms. Duffy was instructed to call 911 with any severe reactions post vaccine: Marland Kitchen Difficulty breathing  . Swelling of your face and throat  . A fast heartbeat  . A bad rash all over your body  . Dizziness and weakness    Immunizations Administered    Name Date Dose VIS Date Route   Pfizer COVID-19 Vaccine 05/02/2019  4:08 PM 0.3 mL 03/04/2019 Intramuscular   Manufacturer: [employer], [employer]   Lot: [id number]   NDC: [address]-5

## 2019-05-13 ENCOUNTER — Ambulatory Visit: Payer: Medicare Other

## 2019-05-16 ENCOUNTER — Other Ambulatory Visit: Payer: Self-pay

## 2019-05-17 ENCOUNTER — Ambulatory Visit (INDEPENDENT_AMBULATORY_CARE_PROVIDER_SITE_OTHER): Payer: Medicare Other

## 2019-05-17 DIAGNOSIS — E538 Deficiency of other specified B group vitamins: Secondary | ICD-10-CM | POA: Diagnosis not present

## 2019-05-17 NOTE — Progress Notes (Signed)
Cyanocobalamin injection given to left deltoid.  Patient tolerated well. 

## 2019-06-15 ENCOUNTER — Ambulatory Visit (INDEPENDENT_AMBULATORY_CARE_PROVIDER_SITE_OTHER): Payer: Medicare Other

## 2019-06-15 ENCOUNTER — Other Ambulatory Visit: Payer: Self-pay

## 2019-06-15 DIAGNOSIS — E538 Deficiency of other specified B group vitamins: Secondary | ICD-10-CM | POA: Diagnosis not present

## 2019-06-15 NOTE — Progress Notes (Signed)
Cyanocobalamin injection given to right deltoid.  Patient tolerated well. 

## 2019-06-16 ENCOUNTER — Ambulatory Visit (INDEPENDENT_AMBULATORY_CARE_PROVIDER_SITE_OTHER): Payer: Medicare Other | Admitting: Family

## 2019-06-16 ENCOUNTER — Ambulatory Visit (INDEPENDENT_AMBULATORY_CARE_PROVIDER_SITE_OTHER): Payer: Medicare Other

## 2019-06-16 ENCOUNTER — Encounter: Payer: Self-pay | Admitting: Family

## 2019-06-16 ENCOUNTER — Other Ambulatory Visit: Payer: Self-pay

## 2019-06-16 VITALS — BP 151/65 | HR 85 | Temp 96.6°F | Ht 62.0 in | Wt 125.8 lb

## 2019-06-16 DIAGNOSIS — M549 Dorsalgia, unspecified: Secondary | ICD-10-CM

## 2019-06-16 LAB — URINALYSIS, COMPLETE
Bilirubin, UA: NEGATIVE
Glucose, UA: NEGATIVE
Ketones, UA: NEGATIVE
Leukocytes,UA: NEGATIVE
Nitrite, UA: NEGATIVE
Protein,UA: NEGATIVE
RBC, UA: NEGATIVE
Specific Gravity, UA: 1.025 (ref 1.005–1.030)
Urobilinogen, Ur: 0.2 mg/dL (ref 0.2–1.0)
pH, UA: 6 (ref 5.0–7.5)

## 2019-06-16 LAB — MICROSCOPIC EXAMINATION
Bacteria, UA: NONE SEEN
Renal Epithel, UA: NONE SEEN /hpf

## 2019-06-16 MED ORDER — DICLOFENAC SODIUM 75 MG PO TBEC: 75.0000 mg | DELAYED_RELEASE_TABLET | Freq: Two times a day (BID) | ORAL | 2 refills | Status: DC

## 2019-06-16 MED ORDER — BACLOFEN 10 MG PO TABS: 10.0000 mg | ORAL_TABLET | Freq: Three times a day (TID) | ORAL | 0 refills | Status: DC

## 2019-06-16 NOTE — Patient Instructions (Signed)
Thoracic Strain °A thoracic strain, which is sometimes called a mid-back strain, is an injury to the muscles or tendons that attach to the upper part of your back behind your chest. This type of injury occurs when a muscle is overstretched or overloaded. °Thoracic strains can range from mild to severe. Mild strains may involve stretching a muscle or tendon without tearing it. These injuries may heal in 1-2 weeks. More severe strains involve tearing of muscle fibers or tendons. These will cause more pain and may take 6-8 weeks to heal. °What are the causes? °This condition may be caused by: °· Trauma, such as a fall or a hit to the body. °· Twisting or overstretching the back. This may result from doing activities that require a lot of energy, such as lifting heavy objects. °In some cases, the cause may not be known. °What increases the risk? °This injury is more common in: °· Athletes. °· People with obesity. °What are the signs or symptoms? °The main symptom of this condition is pain in the middle back, especially with movement. Other symptoms include: °· Stiffness or limited range of motion. °· Sudden muscle tightening (spasms). °How is this diagnosed? °This condition may be diagnosed based on: °· Your symptoms. °· Your medical history. °· A physical exam. °· Imaging tests, such as X-rays or an MRI. °How is this treated? °This condition may be treated with: °· Resting the injured area. °· Applying heat and cold to the injured area. °· Over-the-counter medicines for pain and inflammation, such as NSAIDs. °· Prescription pain medicine or muscle relaxants may be needed for a short time. °· Physical therapy. This will involve doing stretching and strengthening exercises. °Follow these instructions at home: °Managing pain, stiffness, and swelling ° °  ° °· If directed, put ice on the injured area. °? Put ice in a plastic bag. °? Place a towel between your skin and the bag. °? Leave the ice on for 20 minutes, 2-3 times  a day. °· If directed, apply heat to the affected area as often as told by your health care provider. Use the heat source that your health care provider recommends, such as a moist heat pack or a heating pad. °? Place a towel between your skin and the heat source. °? Leave the heat on for 20-30 minutes. °? Remove the heat if your skin turns bright red. This is especially important if you are unable to feel pain, heat, or cold. You may have a greater risk of getting burned. °Activity °· Rest and return to your normal activities as told by your health care provider. Ask your health care provider what activities are safe for you. °· Do exercises as told by your health care provider. °Medicines °· Take over-the-counter and prescription medicines only as told by your health care provider. °· Ask your health care provider if the medicine prescribed to you: °? Requires you to avoid driving or using heavy machinery. °? Can cause constipation. You may need to take these actions to prevent or treat constipation: °§ Drink enough fluid to keep your urine pale yellow. °§ Take over-the-counter or prescription medicines. °§ Eat foods that are high in fiber, such as beans, whole grains, and fresh fruits and vegetables. °§ Limit foods that are high in fat and processed sugars, such as fried or sweet foods. °Injury prevention °To prevent a future mid-back injury: °· Always warm up properly before physical activity or sports. °· Cool down and stretch after being active. °·   Use correct form when playing sports and lifting heavy objects. Bend your knees before you lift heavy objects. °· Use good posture when sitting and standing. °· Stay physically fit and maintain a healthy weight. °? Do at least 150 minutes of moderate-intensity exercise each week, such as brisk walking or water aerobics. °? Do strength exercises at least 2 times each week. ° °General instructions °· Do not use any products that contain nicotine or tobacco, such as  cigarettes, e-cigarettes, and chewing tobacco. If you need help quitting, ask your health care provider. °· Keep all follow-up visits as told by your health care provider. This is important. °Contact a health care provider if: °· Your pain is not helped by medicine. °· Your pain or stiffness is getting worse. °· You develop pain or stiffness in your neck or lower back. °Get help right away if you: °· Have shortness of breath. °· Have chest pain. °· Develop numbness or weakness in your legs or arms. °· Have involuntary loss of urine (urinary incontinence). °Summary °· A thoracic strain, which is sometimes called a mid-back strain, is an injury to the muscles or tendons that attach to the upper part of your back behind your chest. °· This type of injury occurs when a muscle is overstretched or overloaded. °· Rest and return to your normal activities as told by your health care provider. If directed, apply heat or ice to the affected area as often as told by your health care provider. °· Take over-the-counter and prescription medicines only as told by your health care provider. °· Contact a health care provider if you have new or worsening symptoms. °This information is not intended to replace advice given to you by your health care provider. Make sure you discuss any questions you have with your health care provider. °Document Revised: 01/26/2018 Document Reviewed: 01/26/2018 °Elsevier Patient Education © 2020 [employer]. ° °

## 2019-06-16 NOTE — Progress Notes (Signed)
Subjective:    Patient ID: Karla [name], female    DOB: [birth year], 69 y.o.   MRN: [id number]  Chief Complaint  Patient presents with  . Back Pain    been on and off 5 weeks    Pt presents to the office today for chronic follow up.  Back Pain This is a new problem. The current episode started more than 1 month ago. The problem occurs intermittently. The problem is unchanged. The pain is present in the thoracic spine. The quality of the pain is described as aching. The pain does not radiate. The pain is at a severity of 3/10. The pain is mild. The symptoms are aggravated by twisting. Pertinent negatives include no bladder incontinence, bowel incontinence, dysuria, numbness, perianal numbness or tingling. She has tried heat (tylenol) for the symptoms. The treatment provided mild relief.      Review of Systems  Gastrointestinal: Negative for bowel incontinence.  Genitourinary: Negative for bladder incontinence and dysuria.  Musculoskeletal: Positive for back pain.  Neurological: Negative for tingling and numbness.  All other systems reviewed and are negative.      Objective:   Physical Exam Vitals reviewed.  Constitutional:      General: She is not in acute distress.    Appearance: She is well-developed.  HENT:     Head: Normocephalic and atraumatic.  Eyes:     Pupils: Pupils are equal, round, and reactive to light.  Neck:     Thyroid: No thyromegaly.  Cardiovascular:     Rate and Rhythm: Normal rate and regular rhythm.     Heart sounds: Normal heart sounds. No murmur.  Pulmonary:     Effort: Pulmonary effort is normal. No respiratory distress.     Breath sounds: Normal breath sounds. No wheezing.  Abdominal:     General: Bowel sounds are normal. There is no distension.     Palpations: Abdomen is soft.     Tenderness: There is no abdominal tenderness.  Musculoskeletal:        General: No tenderness. Normal range of motion.     Cervical back: Normal range of motion and  neck supple.     Comments: Full ROM   Skin:    General: Skin is warm and dry.  Neurological:     Mental Status: She is alert and oriented to person, place, and time.     Cranial Nerves: No cranial nerve deficit.     Deep Tendon Reflexes: Reflexes are normal and symmetric.  Psychiatric:        Behavior: Behavior normal.        Thought Content: Thought content normal.        Judgment: Judgment normal.    BP (!) 151/65   Pulse 85   Temp (!) 96.6 F (35.9 C) (Temporal)   Ht 5\' 2"  (1.575 m)   Wt 125 lb 12.8 oz (57.1 kg)   SpO2 100%   BMI 23.01 kg/m       Assessment & Plan:  Karla [name] comes in today with chief complaint of Back Pain (been on and off 5 weeks )   Diagnosis and orders addressed:  1. Acute midline back pain, unspecified back location Rest Ice  ROM exercises  RTO if symptoms worsen  - Urinalysis, Complete - DG Thoracic Spine 2 View; Future - baclofen (LIORESAL) 10 MG tablet; Take 1 tablet (10 mg total) by mouth 3 (three) times daily.  Dispense: 30 each; Refill: 0 - diclofenac (VOLTAREN) 75 MG EC  tablet; Take 1 tablet (75 mg total) by mouth 2 (two) times daily.  Dispense: 60 tablet; Refill: [place], FNP

## 2019-06-30 ENCOUNTER — Ambulatory Visit: Payer: Medicare Other | Admitting: Family

## 2019-07-18 ENCOUNTER — Ambulatory Visit (INDEPENDENT_AMBULATORY_CARE_PROVIDER_SITE_OTHER): Payer: Medicare Other

## 2019-07-18 ENCOUNTER — Other Ambulatory Visit: Payer: Self-pay

## 2019-07-18 DIAGNOSIS — E538 Deficiency of other specified B group vitamins: Secondary | ICD-10-CM | POA: Diagnosis not present

## 2019-07-18 NOTE — Progress Notes (Signed)
Cyanocobalamin injection given to left deltoid.  Patient tolerated well. 

## 2019-08-10 ENCOUNTER — Ambulatory Visit: Payer: Medicare Other | Admitting: Family Medicine

## 2019-08-11 ENCOUNTER — Telehealth: Payer: Self-pay | Admitting: Family Medicine

## 2019-08-11 ENCOUNTER — Ambulatory Visit: Payer: Medicare Other | Admitting: Physician Assistant

## 2019-08-12 NOTE — Telephone Encounter (Signed)
Thyroid panel with TSH.  She just had everything else in Nov

## 2019-08-12 NOTE — Telephone Encounter (Signed)
Aware. 

## 2019-08-17 ENCOUNTER — Other Ambulatory Visit: Payer: Self-pay

## 2019-08-17 ENCOUNTER — Encounter: Payer: Self-pay | Admitting: Family Medicine

## 2019-08-17 ENCOUNTER — Ambulatory Visit (INDEPENDENT_AMBULATORY_CARE_PROVIDER_SITE_OTHER): Payer: Medicare Other | Admitting: Family Medicine

## 2019-08-17 VITALS — BP 128/62 | HR 80 | Temp 97.7°F | Ht 62.0 in | Wt 127.6 lb

## 2019-08-17 DIAGNOSIS — Z7689 Persons encountering health services in other specified circumstances: Secondary | ICD-10-CM

## 2019-08-17 DIAGNOSIS — E7800 Pure hypercholesterolemia, unspecified: Secondary | ICD-10-CM

## 2019-08-17 DIAGNOSIS — E034 Atrophy of thyroid (acquired): Secondary | ICD-10-CM

## 2019-08-17 DIAGNOSIS — E559 Vitamin D deficiency, unspecified: Secondary | ICD-10-CM | POA: Diagnosis not present

## 2019-08-17 DIAGNOSIS — E538 Deficiency of other specified B group vitamins: Secondary | ICD-10-CM | POA: Diagnosis not present

## 2019-08-17 NOTE — Progress Notes (Signed)
Subjective: CC: Hypothyroidism, est care PCP: Janora Norlander, DO HYW:[id number] Karla [name] is a 69 y.o. female presenting to clinic today for:  1. Hypothyroidism, acquired atrophy/hyperlipidemia Patient reports onset many years ago.  She is been fairly stable on her current dose of Synthroid for quite some time now.  Denies any change in voice, difficulty swallowing, tremor or heart palpitations.  She tries to maintain good health through diet and exercise.  No chest pain, shortness of breath, change in exercise tolerance.  She is compliant with half a tablet of Crestor 5 mg 3 days/week.  She asked that we keep a close eye on her cholesterol and she prefers to continue getting it every 6 months despite current guidelines as she wants to be proactive about her health  2.  B12 deficiency Patient gets interval B12 injections.  She is scheduled for next 1 tomorrow.  Does not report any proprioception issues.  3.  GERD Patient recently evaluated by her gastroenterologist.  She gets colonoscopy every 5 years.  No hematochezia, melena.  She takes Dexilant each morning and Pepcid each evening.  She also sleeps on a wedge pillow because her acid is severe secondary to hiatal hernia.   ROS: Per HPI  No Known Allergies Past Medical History:  Diagnosis Date  . GERD (gastroesophageal reflux disease)   . Thyroid disease     Current Outpatient Medications:  .  calcium carbonate 1250 MG capsule, Take 250 mg by mouth daily. , Disp: , Rfl:  .  Cholecalciferol (VITAMIN D) 2000 UNITS CAPS, Take 1 capsule by mouth daily., Disp: , Rfl:  .  DEXILANT 60 MG capsule, Take 60 mg by mouth daily., Disp: , Rfl:  .  estradiol (ESTRACE) 0.1 MG/GM vaginal cream, Place 1 Applicatorful vaginally 2 (two) times a week., Disp: , Rfl:  .  famotidine (PEPCID) 40 MG tablet, Take by mouth., Disp: , Rfl:  .  levothyroxine (SYNTHROID) 88 MCG tablet, Take 1 tablet (88 mcg total) by mouth daily before breakfast., Disp: 90 tablet,  Rfl: 3 .  RESTASIS 0.05 % ophthalmic emulsion, , Disp: , Rfl: 3 .  rosuvastatin (CRESTOR) 5 MG tablet, TAKE 0.5 TAB BY MOUTH ON MON, WED AND FRI EVENINGS., Disp: 18 tablet, Rfl: 3  Current Facility-Administered Medications:  .  cyanocobalamin ((VITAMIN B-12)) injection 1,000 mcg, 1,000 mcg, Intramuscular, Q30 days, Dettinger, Fransisca Kaufmann, MD, 1,000 mcg at 07/18/19 1043 Social History   Socioeconomic History  . Marital status: Married    Spouse name: Associate Professor   . Number of children: 2  . Years of education: 31  . Highest education level: Associate degree: academic program  Occupational History  . Occupation: Retired  Tobacco Use  . Smoking status: Never Smoker  . Smokeless tobacco: Never Used  Substance and Sexual Activity  . Alcohol use: No  . Drug use: No  . Sexual activity: Yes  Other Topics Concern  . Not on file  Social History Narrative  . Not on file   Social Determinants of Health   Financial Resource Strain: Low Risk   . Difficulty of Paying Living Expenses: Not hard at all  Food Insecurity: No Food Insecurity  . Worried About Charity fundraiser in the Last Year: Never true  . Ran Out of Food in the Last Year: Never true  Transportation Needs: No Transportation Needs  . Lack of Transportation (Medical): No  . Lack of Transportation (Non-Medical): No  Physical Activity: Sufficiently Active  . Days of Exercise per  Week: 7 days  . Minutes of Exercise per Session: 30 min  Stress: No Stress Concern Present  . Feeling of Stress : Not at all  Social Connections: Slightly Isolated  . Frequency of Communication with Friends and Family: More than three times a week  . Frequency of Social Gatherings with Friends and Family: More than three times a week  . Attends Religious Services: 1 to 4 times per year  . Active Member of Clubs or Organizations: No  . Attends Archivist Meetings: Never  . Marital Status: Married  Human resources officer Violence: Not At Risk  . Fear  of Current or Ex-Partner: No  . Emotionally Abused: No  . Physically Abused: No  . Sexually Abused: No   Family History  Problem Relation Age of Onset  . Hypertension Mother   . Hyperlipidemia Mother   . Hypertension Father     Objective: Office vital signs reviewed. BP 128/62   Pulse 80   Temp 97.7 F (36.5 C)   Ht '5\' 2"'  (1.575 m)   Wt 127 lb 9.6 oz (57.9 kg)   SpO2 100%   BMI 23.34 kg/m   Physical Examination:  General: Awake, alert, well nourished, well appearing female. No acute distress HEENT: Normal; thyroid somewhat fulfilling.  No exophthalmos or goiter.  No carotid bruits Cardio: regular rate and rhythm, S1S2 heard, no murmurs appreciated Pulm: clear to auscultation bilaterally, no wheezes, rhonchi or rales; normal work of breathing on room air GI: soft, non-tender, non-distended, bowel sounds present x4, no hepatomegaly, no splenomegaly, no masses Extremities: warm, well perfused, No edema, cyanosis or clubbing; +2 pulses bilaterally MSK: normal gait and station Skin: dry; intact; no rashes or lesions; normal temperature Neuro: No tremor  Assessment/ Plan: 69 y.o. female   1. Hypothyroidism due to acquired atrophy of thyroid Asymptomatic.  Check thyroid panel - thyroid panel  2. Establishing care with new doctor, encounter for  3. B12 deficiency B12 injection administered. - Vitamin B12  4. Vitamin D deficiency - VITAMIN D 25 Hydroxy (Vit-D Deficiency, Fractures)  5. Pure hypercholesterolemia She is fasting this morning.  We discussed current guidelines.  I reinforced that she really does not need every 6 months cholesterol checks but because she is fasting I will oblige her today.  Her last several cholesterol checks have been stable with no medication adjustment.  In fact her ASCVD risk score cannot be calculated because her cholesterol is below lower cut off.  I would suspect that her risk is extremely minimal for cardiovascular event at this point. -  Lipid Panel - CMP14+EGFR  The ASCVD Risk score Mikey Bussing DC Jr., et al., 2013) failed to calculate for the following reasons:   The valid total cholesterol range is 130 to 320 mg/dL   No orders of the defined types were placed in this encounter.  No orders of the defined types were placed in this encounter.    Janora Norlander, DO [place] [phone]

## 2019-08-18 ENCOUNTER — Ambulatory Visit: Payer: Medicare Other

## 2019-08-18 ENCOUNTER — Encounter: Payer: Self-pay | Admitting: Family Medicine

## 2019-08-18 LAB — LIPID PANEL
Chol/HDL Ratio: 2.2 ratio (ref 0.0–4.4)
Cholesterol, Total: 132 mg/dL (ref 100–199)
HDL: 60 mg/dL (ref >39)
LDL Chol Calc (NIH): 60 mg/dL (ref 0–99)
Triglycerides: 55 mg/dL (ref 0–149)
VLDL Cholesterol Cal: 12 mg/dL (ref 5–40)

## 2019-08-18 LAB — CMP14+EGFR
ALT: 14 IU/L (ref 0–32)
AST: 24 IU/L (ref 0–40)
Albumin/Globulin Ratio: 1.8 (ref 1.2–2.2)
Albumin: 4.6 g/dL (ref 3.8–4.8)
Alkaline Phosphatase: 44 IU/L — ABNORMAL LOW (ref 48–121)
BUN/Creatinine Ratio: 19 (ref 12–28)
BUN: 15 mg/dL (ref 8–27)
Bilirubin Total: 1.2 mg/dL (ref 0.0–1.2)
CO2: 23 mmol/L (ref 20–29)
Calcium: 9.1 mg/dL (ref 8.7–10.3)
Chloride: 104 mmol/L (ref 96–106)
Creatinine, Ser: 0.8 mg/dL (ref 0.57–1.00)
GFR calc Af Amer: 89 mL/min/{1.73_m2} (ref >59)
GFR calc non Af Amer: 77 mL/min/{1.73_m2} (ref >59)
Globulin, Total: 2.5 g/dL (ref 1.5–4.5)
Glucose: 80 mg/dL (ref 65–99)
Potassium: 4.2 mmol/L (ref 3.5–5.2)
Sodium: 143 mmol/L (ref 134–144)
Total Protein: 7.1 g/dL (ref 6.0–8.5)

## 2019-08-18 LAB — VITAMIN B12: Vitamin B-12: >2000 pg/mL — ABNORMAL HIGH (ref 232–1245)

## 2019-08-18 LAB — VITAMIN D 25 HYDROXY (VIT D DEFICIENCY, FRACTURES): Vit D, 25-Hydroxy: 64.9 ng/mL (ref 30.0–100.0)

## 2019-08-19 LAB — THYROID PANEL WITH TSH
Free Thyroxine Index: 2.8 (ref 1.2–4.9)
T3 Uptake Ratio: 28 % (ref 24–39)
T4, Total: 10 ug/dL (ref 4.5–12.0)
TSH: 1.09 u[IU]/mL (ref 0.450–4.500)

## 2019-08-19 LAB — SPECIMEN STATUS REPORT

## 2019-09-19 ENCOUNTER — Other Ambulatory Visit: Payer: Self-pay

## 2019-09-19 ENCOUNTER — Other Ambulatory Visit: Payer: Self-pay | Admitting: *Deleted

## 2019-09-19 ENCOUNTER — Ambulatory Visit (INDEPENDENT_AMBULATORY_CARE_PROVIDER_SITE_OTHER): Payer: Medicare Other

## 2019-09-19 DIAGNOSIS — E538 Deficiency of other specified B group vitamins: Secondary | ICD-10-CM | POA: Diagnosis not present

## 2019-09-19 NOTE — Progress Notes (Signed)
B12 given and tolerated well °

## 2019-09-19 NOTE — Addendum Note (Signed)
Addended by: Prescott Gum on: 09/19/2019 10:08 AM   Modules accepted: Orders

## 2019-09-20 LAB — VITAMIN B12: Vitamin B-12: 827 pg/mL (ref 232–1245)

## 2019-10-19 ENCOUNTER — Other Ambulatory Visit: Payer: Self-pay

## 2019-10-19 ENCOUNTER — Ambulatory Visit (INDEPENDENT_AMBULATORY_CARE_PROVIDER_SITE_OTHER): Payer: Medicare Other | Admitting: *Deleted

## 2019-10-19 DIAGNOSIS — E538 Deficiency of other specified B group vitamins: Secondary | ICD-10-CM | POA: Diagnosis not present

## 2019-10-19 MED ORDER — CYANOCOBALAMIN 1000 MCG/ML IJ SOLN
1000.0000 ug | Freq: Once | INTRAMUSCULAR | Status: AC
  Administered 2019-10-19: 1000 ug via INTRAMUSCULAR

## 2019-10-19 NOTE — Progress Notes (Signed)
Patient in today for monthly B12 injection. 1000 mcg given IM in Right deltoid. Patient tolerated well.

## 2019-11-21 ENCOUNTER — Ambulatory Visit (INDEPENDENT_AMBULATORY_CARE_PROVIDER_SITE_OTHER): Payer: Medicare Other | Admitting: *Deleted

## 2019-11-21 ENCOUNTER — Other Ambulatory Visit: Payer: Self-pay

## 2019-11-21 ENCOUNTER — Ambulatory Visit (INDEPENDENT_AMBULATORY_CARE_PROVIDER_SITE_OTHER): Payer: Medicare Other

## 2019-11-21 DIAGNOSIS — Z0000 Encounter for general adult medical examination without abnormal findings: Secondary | ICD-10-CM

## 2019-11-21 DIAGNOSIS — E538 Deficiency of other specified B group vitamins: Secondary | ICD-10-CM

## 2019-11-21 MED ORDER — CYANOCOBALAMIN 1000 MCG/ML IJ SOLN
1000.0000 ug | INTRAMUSCULAR | Status: AC
  Administered 2019-11-21 – 2024-04-27 (×51): 1000 ug via INTRAMUSCULAR

## 2019-11-21 NOTE — Progress Notes (Signed)
Cyanocobalamin injection given to left deltoid.  Patient tolerated well. 

## 2019-11-21 NOTE — Progress Notes (Signed)
MEDICARE ANNUAL WELLNESS VISIT  11/21/2019  Telephone Visit Disclaimer This Medicare AWV was conducted by telephone due to national recommendations for restrictions regarding the COVID-19 Pandemic (e.g. social distancing).  I verified, using two identifiers, that I am speaking with Karla [name] or their authorized healthcare agent. I discussed the limitations, risks, security, and privacy concerns of performing an evaluation and management service by telephone and the potential availability of an in-person appointment in the future. The patient expressed understanding and agreed to proceed.   Subjective:  Karla [name] is a 69 y.o. female patient of Raliegh Ip, DO who had a Medicare Annual Wellness Visit today via telephone. Karla [name] is Retired and lives with their spouse. she has 2 children. she reports that she is socially active and does interact with friends/family regularly. she is markedly physically active and enjoys spending time with her grandchildren, computer games, reading, singing, watching TV and working with flowers.  Patient Care Team: Raliegh Ip, DO as PCP - General (Family Medicine)  Advanced Directives 11/21/2019 11/16/2018  Does Patient Have a Medical Advance Directive? No No  Would patient like information on creating a medical advance directive? No - Patient declined Yes (MAU/Ambulatory/Procedural Areas - Information given)    Hospital Utilization Over the Past 12 Months: # of hospitalizations or ER visits: 0 # of surgeries: 0  Review of Systems    Patient reports that her overall health is unchanged compared to last year.  History obtained from chart review  Patient Reported Readings (BP, Pulse, CBG, Weight, etc) none  Pain Assessment Pain : No/denies pain     Current Medications & Allergies (verified) Allergies as of 11/21/2019   No Known Allergies     Medication List       Accurate as of November 21, 2019 11:45 AM. If you have any  questions, ask your nurse or doctor.        calcium carbonate 1250 MG capsule Take 250 mg by mouth daily.   Dexilant 60 MG capsule Generic drug: dexlansoprazole Take 60 mg by mouth daily.   estradiol 0.1 MG/GM vaginal cream Commonly known as: ESTRACE Place 1 Applicatorful vaginally 2 (two) times a week.   famotidine 40 MG tablet Commonly known as: PEPCID TAKE 1 TABLET BY MOUTH EVERY DAY   ibandronate 150 MG tablet Commonly known as: BONIVA Take 150 mg by mouth every 30 (thirty) days.   levothyroxine 88 MCG tablet Commonly known as: Synthroid Take 1 tablet (88 mcg total) by mouth daily before breakfast.   Restasis 0.05 % ophthalmic emulsion Generic drug: cycloSPORINE   rosuvastatin 5 MG tablet Commonly known as: CRESTOR TAKE 0.5 TAB BY MOUTH ON MON, WED AND FRI EVENINGS.   Vitamin D 50 MCG (2000 UT) Caps Take 1 capsule by mouth daily.       History (reviewed): Past Medical History:  Diagnosis Date  . GERD (gastroesophageal reflux disease)   . Thyroid disease    Past Surgical History:  Procedure Laterality Date  . CERVICAL SPINE SURGERY     herniated disc   Family History  Problem Relation Age of Onset  . Hypertension Mother   . Hyperlipidemia Mother   . Hypertension Father    Social History   Socioeconomic History  . Marital status: Married    Spouse name: Museum/gallery conservator   . Number of children: 2  . Years of education: 69  . Highest education level: Associate degree: academic program  Occupational History  . Occupation: Retired  Tobacco Use  . Smoking status: Never Smoker  . Smokeless tobacco: Never Used  Vaping Use  . Vaping Use: Never used  Substance and Sexual Activity  . Alcohol use: No  . Drug use: No  . Sexual activity: Yes  Other Topics Concern  . Not on file  Social History Narrative  . Not on file   Social Determinants of Health   Financial Resource Strain: Low Risk   . Difficulty of Paying Living Expenses: Not hard at all  Food  Insecurity: No Food Insecurity  . Worried About Programme researcher, broadcasting/film/video in the Last Year: Never true  . Ran Out of Food in the Last Year: Never true  Transportation Needs: No Transportation Needs  . Lack of Transportation (Medical): No  . Lack of Transportation (Non-Medical): No  Physical Activity: Sufficiently Active  . Days of Exercise per Week: 7 days  . Minutes of Exercise per Session: 60 min  Stress: No Stress Concern Present  . Feeling of Stress : Not at all  Social Connections: Socially Integrated  . Frequency of Communication with Friends and Family: More than three times a week  . Frequency of Social Gatherings with Friends and Family: More than three times a week  . Attends Religious Services: More than 4 times per year  . Active Member of Clubs or Organizations: Yes  . Attends Banker Meetings: More than 4 times per year  . Marital Status: Married    Activities of Daily Living In your present state of health, do you have any difficulty performing the following activities: 11/21/2019  Hearing? N  Vision? N  Difficulty concentrating or making decisions? N  Walking or climbing stairs? N  Dressing or bathing? N  Doing errands, shopping? N  Preparing Food and eating ? N  Using the Toilet? N  In the past six months, have you accidently leaked urine? N  Do you have problems with loss of bowel control? N  Managing your Medications? N  Managing your Finances? N  Housekeeping or managing your Housekeeping? N  Some recent data might be hidden    Patient Education/ Literacy How often do you need to have someone help you when you read instructions, pamphlets, or other written materials from your doctor or pharmacy?: 1 - Never What is the last grade level you completed in school?: Associates Degree  Exercise Current Exercise Habits: Home exercise routine, Type of exercise: walking, Time (Minutes): 60, Frequency (Times/Week): 7, Weekly Exercise (Minutes/Week): 420,  Intensity: Moderate, Exercise limited by: None identified  Diet Patient reports consuming 3 meals a day and 1 snack(s) a day Patient reports that her primary diet is: Regular Patient reports that she does have regular access to food.   Depression Screen PHQ 2/9 Scores 11/21/2019 08/17/2019 06/16/2019 02/09/2019 11/16/2018 02/04/2018 08/06/2017  PHQ - 2 Score 0 0 0 0 0 0 0     Fall Risk Fall Risk  11/21/2019 08/17/2019 06/16/2019 02/09/2019 11/16/2018  Falls in the past year? 0 0 0 0 0  Number falls in past yr: - - - - 0  Injury with Fall? - - - - 0     Objective:  Karla [name] seemed alert and oriented and she participated appropriately during our telephone visit.  Blood Pressure Weight BMI  BP Readings from Last 3 Encounters:  08/17/19 128/62  06/16/19 (!) 151/65  02/09/19 137/72   Wt Readings from Last 3 Encounters:  08/17/19 127 lb 9.6 oz (57.9 kg)  06/16/19  125 lb 12.8 oz (57.1 kg)  02/09/19 126 lb (57.2 kg)   BMI Readings from Last 1 Encounters:  08/17/19 23.34 kg/m    *Unable to obtain current vital signs, weight, and BMI due to telephone visit type  Hearing/Vision  . Karla [name] did not seem to have difficulty with hearing/understanding during the telephone conversation . Reports that she has had a formal eye exam by an eye care professional within the past year . Reports that she has not had a formal hearing evaluation within the past year *Unable to fully assess hearing and vision during telephone visit type  Cognitive Function: 6CIT Screen 11/21/2019 11/16/2018  What Year? 0 points 0 points  What month? 0 points 0 points  What time? 0 points 0 points  Count back from 20 0 points 0 points  Months in reverse 0 points 0 points  Repeat phrase 0 points 0 points  Total Score 0 0   (Normal:0-7, Significant for Dysfunction: >8)  Normal Cognitive Function Screening: Yes   Immunization & Health Maintenance Record Immunization History  Administered Date(s) Administered  .  Fluad Quad(high Dose 65+) 12/30/2018  . Influenza, High Dose Seasonal PF 01/04/2018  . Influenza,inj,Quad PF,6+ Mos 01/04/2014, 12/22/2014, 12/19/2015, 01/08/2017  . PFIZER SARS-COV-2 Vaccination 04/13/2019, 05/02/2019  . Pneumococcal Conjugate-13 01/04/2014  . Pneumococcal Polysaccharide-23 04/08/2018  . Tdap 12/01/2013  . Zoster 06/05/2014    Health Maintenance  Topic Date Due  . INFLUENZA VACCINE  10/23/2019  . MAMMOGRAM  01/24/2021  . DEXA SCAN  01/24/2021  . COLONOSCOPY  05/04/2022  . TETANUS/TDAP  12/02/2023  . COVID-19 Vaccine  Completed  . Hepatitis C Screening  Completed  . PNA vac Low Risk Adult  Completed       Assessment  This is a routine wellness examination for Karla [name].  Health Maintenance: Due or Overdue Health Maintenance Due  Topic Date Due  . INFLUENZA VACCINE  10/23/2019    [facility] [name] does not need a referral for Community Assistance: Care Management:   no Social Work:    no Prescription Assistance:  no Nutrition/Diabetes Education:  no   Plan:  Personalized Goals Goals Addressed            This Visit's Progress   . DIET - INCREASE WATER INTAKE       Try to drink 6-8 glasses of water daily      Personalized Health Maintenance & Screening Recommendations  Influenza vaccine  Lung Cancer Screening Recommended: no (Low Dose CT Chest recommended if Age 25-80 years, 30 pack-year currently smoking OR have quit w/in past 15 years) Hepatitis C Screening recommended: no HIV Screening recommended: no  Advanced Directives: Written information was not prepared per patient's request.  Referrals & Orders No orders of the defined types were placed in this encounter.   Follow-up Plan . Follow-up with Raliegh Ip, DO as planned . Consider Flu vaccine at your next visit with your PCP    I have personally reviewed and noted the following in the patient's chart:   . Medical and social history . Use of alcohol, tobacco or illicit  drugs  . Current medications and supplements . Functional ability and status . Nutritional status . Physical activity . Advanced directives . List of other physicians . Hospitalizations, surgeries, and ER visits in previous 12 months . Vitals . Screenings to include cognitive, depression, and falls . Referrals and appointments  In addition, I have reviewed and discussed with [facility] certain preventive protocols, quality  metrics, and best practice recommendations. A written personalized care plan for preventive services as well as general preventive health recommendations is available and can be mailed to the patient at her request.      Karla DienerSouthern, Torrell Krutz G, LPN  1/61/09608/30/2021

## 2019-12-23 ENCOUNTER — Ambulatory Visit (INDEPENDENT_AMBULATORY_CARE_PROVIDER_SITE_OTHER): Payer: Medicare Other

## 2019-12-23 ENCOUNTER — Other Ambulatory Visit: Payer: Self-pay

## 2019-12-23 DIAGNOSIS — E538 Deficiency of other specified B group vitamins: Secondary | ICD-10-CM | POA: Diagnosis not present

## 2019-12-23 NOTE — Progress Notes (Signed)
Cyanocobalamin injection given to right deltoid.  Patient tolerated well. 

## 2019-12-26 ENCOUNTER — Other Ambulatory Visit: Payer: Self-pay | Admitting: Gynecology

## 2019-12-26 DIAGNOSIS — Z1231 Encounter for screening mammogram for malignant neoplasm of breast: Secondary | ICD-10-CM

## 2020-01-04 ENCOUNTER — Other Ambulatory Visit: Payer: Self-pay

## 2020-01-04 ENCOUNTER — Ambulatory Visit (INDEPENDENT_AMBULATORY_CARE_PROVIDER_SITE_OTHER): Payer: Medicare Other

## 2020-01-04 DIAGNOSIS — Z23 Encounter for immunization: Secondary | ICD-10-CM | POA: Diagnosis not present

## 2020-01-04 NOTE — Progress Notes (Signed)
   Covid-19 Vaccination Clinic  Name:  Karla [name]    MRN: [id number] DOB: 05-05-1950  01/04/2020  [name] was observed post Covid-19 immunization for 15 minutes without incident. She was provided with Vaccine Information Sheet and instruction to access the V-Safe system.   [name] was instructed to call 911 with any severe reactions post vaccine: Marland Kitchen Difficulty breathing  . Swelling of face and throat  . A fast heartbeat  . A bad rash all over body  . Dizziness and weakness

## 2020-01-23 ENCOUNTER — Ambulatory Visit (INDEPENDENT_AMBULATORY_CARE_PROVIDER_SITE_OTHER): Payer: Medicare Other

## 2020-01-23 ENCOUNTER — Other Ambulatory Visit: Payer: Self-pay

## 2020-01-23 DIAGNOSIS — E538 Deficiency of other specified B group vitamins: Secondary | ICD-10-CM

## 2020-01-23 NOTE — Progress Notes (Signed)
B12 given and tolerated well °

## 2020-01-25 ENCOUNTER — Ambulatory Visit: Payer: Medicare Other

## 2020-01-26 ENCOUNTER — Ambulatory Visit: Payer: Medicare Other

## 2020-02-15 ENCOUNTER — Other Ambulatory Visit: Payer: Self-pay | Admitting: Family Medicine

## 2020-02-15 ENCOUNTER — Other Ambulatory Visit: Payer: Self-pay

## 2020-02-15 ENCOUNTER — Other Ambulatory Visit: Payer: Medicare Other

## 2020-02-15 DIAGNOSIS — E034 Atrophy of thyroid (acquired): Secondary | ICD-10-CM

## 2020-02-16 LAB — THYROID PANEL WITH TSH
Free Thyroxine Index: 2.8 (ref 1.2–4.9)
T3 Uptake Ratio: 26 % (ref 24–39)
T4, Total: 10.9 ug/dL (ref 4.5–12.0)
TSH: 2.09 u[IU]/mL (ref 0.450–4.500)

## 2020-02-20 ENCOUNTER — Ambulatory Visit (INDEPENDENT_AMBULATORY_CARE_PROVIDER_SITE_OTHER): Payer: Medicare Other | Admitting: Family Medicine

## 2020-02-20 ENCOUNTER — Encounter: Payer: Self-pay | Admitting: Family Medicine

## 2020-02-20 ENCOUNTER — Other Ambulatory Visit: Payer: Self-pay

## 2020-02-20 VITALS — BP 140/64 | HR 90 | Temp 97.6°F | Ht 62.0 in | Wt 126.0 lb

## 2020-02-20 DIAGNOSIS — E538 Deficiency of other specified B group vitamins: Secondary | ICD-10-CM

## 2020-02-20 DIAGNOSIS — I700 Atherosclerosis of aorta: Secondary | ICD-10-CM

## 2020-02-20 DIAGNOSIS — E034 Atrophy of thyroid (acquired): Secondary | ICD-10-CM | POA: Diagnosis not present

## 2020-02-20 DIAGNOSIS — M7050 Other bursitis of knee, unspecified knee: Secondary | ICD-10-CM

## 2020-02-20 DIAGNOSIS — M8580 Other specified disorders of bone density and structure, unspecified site: Secondary | ICD-10-CM

## 2020-02-20 DIAGNOSIS — E7800 Pure hypercholesterolemia, unspecified: Secondary | ICD-10-CM

## 2020-02-20 DIAGNOSIS — S93311A Subluxation of tarsal joint of right foot, initial encounter: Secondary | ICD-10-CM

## 2020-02-20 DIAGNOSIS — R030 Elevated blood-pressure reading, without diagnosis of hypertension: Secondary | ICD-10-CM

## 2020-02-20 DIAGNOSIS — Z780 Asymptomatic menopausal state: Secondary | ICD-10-CM

## 2020-02-20 LAB — LIPID PANEL
Chol/HDL Ratio: 2.5 ratio (ref 0.0–4.4)
Cholesterol, Total: 144 mg/dL (ref 100–199)
HDL: 58 mg/dL (ref >39)
LDL Chol Calc (NIH): 71 mg/dL (ref 0–99)
Triglycerides: 76 mg/dL (ref 0–149)
VLDL Cholesterol Cal: 15 mg/dL (ref 5–40)

## 2020-02-20 LAB — CMP14+EGFR
ALT: 14 IU/L (ref 0–32)
AST: 23 IU/L (ref 0–40)
Albumin/Globulin Ratio: 1.9 (ref 1.2–2.2)
Albumin: 4.8 g/dL (ref 3.8–4.8)
Alkaline Phosphatase: 51 IU/L (ref 44–121)
BUN/Creatinine Ratio: 17 (ref 12–28)
BUN: 14 mg/dL (ref 8–27)
Bilirubin Total: 0.8 mg/dL (ref 0.0–1.2)
CO2: 21 mmol/L (ref 20–29)
Calcium: 9.5 mg/dL (ref 8.7–10.3)
Chloride: 104 mmol/L (ref 96–106)
Creatinine, Ser: 0.83 mg/dL (ref 0.57–1.00)
GFR calc Af Amer: 84 mL/min/1.73 (ref >59)
GFR calc non Af Amer: 73 mL/min/1.73 (ref >59)
Globulin, Total: 2.5 g/dL (ref 1.5–4.5)
Glucose: 80 mg/dL (ref 65–99)
Potassium: 4 mmol/L (ref 3.5–5.2)
Sodium: 144 mmol/L (ref 134–144)
Total Protein: 7.3 g/dL (ref 6.0–8.5)

## 2020-02-20 LAB — SPECIMEN STATUS REPORT

## 2020-02-20 MED ORDER — LEVOTHYROXINE SODIUM 88 MCG PO TABS
88.0000 ug | ORAL_TABLET | Freq: Every day | ORAL | 3 refills | Status: DC
Start: 2020-02-20 — End: 2021-02-28

## 2020-02-20 MED ORDER — ROSUVASTATIN CALCIUM 5 MG PO TABS: ORAL_TABLET | ORAL | 3 refills | Status: DC

## 2020-02-20 NOTE — Progress Notes (Signed)
Subjective: OA:CZYSAYTKZSWFUX PCP: Janora Norlander, DO NAT:FTDDUK Karla [name] is a 69 y.o. female presenting to clinic today for:  1. Acquired Hypothyroidism Patient reports compliance with her Synthroid.  No tremor, heart palpitations, change in voice or bowel habits  2.  Hyperlipidemia Patient reports compliance with Crestor 3 times weekly.  No chest pain, shortness of breath, headaches or edema.  She asked that her lipid be obtained every 6 months for knowledge purposes.  No prior MI, CVA.  She is not a diabetic.  She has no hypertension.  Thoracic aortic atherosclerosis was noted on a chest x-ray in 2019.  This was not commented on in her March 2021 x-ray.  3.  Osteopenia Patient is treated with Boniva 150 mg.  DEXA was performed in November 2020.  4.  Right lateral foot pain/knee pain Patient reports that she has been having a little bit of inflammation protrusion of the bone on the lateral right foot.  She denies wearing any tight shoes.  She also reports bilateral knee pain anteriorly that occurred after she was assisting her mother to get up, using her knees.  Symptoms seem to be getting better now that she is modified her activity.  Knees felt tight for a while but they seem to be getting better.  Does not report taking any medications for these ailments  ROS: Per HPI  No Known Allergies Past Medical History:  Diagnosis Date  . GERD (gastroesophageal reflux disease)   . Thyroid disease     Current Outpatient Medications:  .  calcium carbonate 1250 MG capsule, Take 250 mg by mouth daily. , Disp: , Rfl:  .  Cholecalciferol (VITAMIN D) 2000 UNITS CAPS, Take 1 capsule by mouth daily., Disp: , Rfl:  .  DEXILANT 60 MG capsule, Take 60 mg by mouth daily., Disp: , Rfl:  .  estradiol (ESTRACE) 0.1 MG/GM vaginal cream, Place 1 Applicatorful vaginally 2 (two) times a week., Disp: , Rfl:  .  famotidine (PEPCID) 40 MG tablet, TAKE 1 TABLET BY MOUTH EVERY DAY, Disp: , Rfl:  .   ibandronate (BONIVA) 150 MG tablet, Take 150 mg by mouth every 30 (thirty) days., Disp: , Rfl:  .  levothyroxine (SYNTHROID) 88 MCG tablet, Take 1 tablet (88 mcg total) by mouth daily before breakfast., Disp: 90 tablet, Rfl: 3 .  RESTASIS 0.05 % ophthalmic emulsion, , Disp: , Rfl: 3 .  rosuvastatin (CRESTOR) 5 MG tablet, TAKE 0.5 TAB BY MOUTH ON MON, WED AND FRI EVENINGS., Disp: 18 tablet, Rfl: 3  Current Facility-Administered Medications:  .  cyanocobalamin ((VITAMIN B-12)) injection 1,000 mcg, 1,000 mcg, Intramuscular, Q30 days, Memorie Yokoyama M, DO, 1,000 mcg at 01/23/20 1004 Social History   Socioeconomic History  . Marital status: Married    Spouse name: Associate Professor   . Number of children: 2  . Years of education: 50  . Highest education level: Associate degree: academic program  Occupational History  . Occupation: Retired  Tobacco Use  . Smoking status: Never Smoker  . Smokeless tobacco: Never Used  Vaping Use  . Vaping Use: Never used  Substance and Sexual Activity  . Alcohol use: No  . Drug use: No  . Sexual activity: Yes  Other Topics Concern  . Not on file  Social History Narrative  . Not on file   Social Determinants of Health   Financial Resource Strain: Low Risk   . Difficulty of Paying Living Expenses: Not hard at all  Food Insecurity: No Food Insecurity  .  Worried About Charity fundraiser in the Last Year: Never true  . Ran Out of Food in the Last Year: Never true  Transportation Needs: No Transportation Needs  . Lack of Transportation (Medical): No  . Lack of Transportation (Non-Medical): No  Physical Activity: Sufficiently Active  . Days of Exercise per Week: 7 days  . Minutes of Exercise per Session: 60 min  Stress: No Stress Concern Present  . Feeling of Stress : Not at all  Social Connections: Socially Integrated  . Frequency of Communication with Friends and Family: More than three times a week  . Frequency of Social Gatherings with Friends and  Family: More than three times a week  . Attends Religious Services: More than 4 times per year  . Active Member of Clubs or Organizations: Yes  . Attends Archivist Meetings: More than 4 times per year  . Marital Status: Married  Human resources officer Violence: Not At Risk  . Fear of Current or Ex-Partner: No  . Emotionally Abused: No  . Physically Abused: No  . Sexually Abused: No   Family History  Problem Relation Age of Onset  . Hypertension Mother   . Hyperlipidemia Mother   . Hypertension Father     Objective: Office vital signs reviewed. BP 140/64   Pulse 90   Temp 97.6 F (36.4 C) (Temporal)   Ht 5' 2" (1.575 m)   Wt 126 lb (57.2 kg)   SpO2 100%   BMI 23.05 kg/m   Physical Examination:  General: Awake, alert, well nourished, No acute distress HEENT: Normal, sclera white, no carotid bruits Cardio: regular rate and rhythm, S1S2 heard, no murmurs appreciated Pulm: clear to auscultation bilaterally, no wheezes, rhonchi or rales; normal work of breathing on room air Extremities: warm, well perfused, No edema, cyanosis or clubbing; +2 pulses bilaterally MSK: normal gait and station  Right foot: She has mild prominence of the cuboid bone on the right foot.  No significant erythema, edema or warmth  Assessment/ Plan: 69 y.o. female   Hypothyroidism due to acquired atrophy of thyroid - Plan: Thyroid Panel With TSH, CMP14+EGFR  Pure hypercholesterolemia - Plan: Lipid panel, CMP14+EGFR  Thoracic aorta atherosclerosis (HCC)  B12 deficiency - Plan: CBC, Vitamin B12  Osteopenia after menopause - Plan: VITAMIN D 25 Hydroxy (Vit-D Deficiency, Fractures)  Cuboid syndrome of right foot  Patellar bursitis, unspecified laterality  White coat syndrome without hypertension  Thyroid panel was normal.  Will place future orders for the full metabolic work-up that patient desires.  She does have a history of thoracic aortic atherosclerosis but again we discussed that  guidelines suggest that cholesterol should only be checked once yearly unless she is had an event.  Anything beyond that is probably elective at this point  CBC could not be added previous labs as the window of edition for that test tube has passed.  However, we will plan to add CBC for next lab draw as well as a vitamin B12 level  She is currently being treated with a bisphosphonate.  Check vitamin D, calcium level  Physical exam for the right lateral foot is consistent with a cuboid syndrome.  Discussed icing the affected area.  Okay to use topical NSAIDs  I suspect that the patellar inflammation she experienced was patellar bursitis given its resolution  Her blood pressures are extremely well controlled at home ranging anywhere from 161-096 systolic over 04V to 40J diastolic.  No evidence of hypertension at home but she certainly  has fluctuations in the blood pressure when she is here at the office, suggestive of a whitecoat hypertension.  No orders of the defined types were placed in this encounter.  No orders of the defined types were placed in this encounter.    Ashly M Gottschalk, DO [facility] [phone]   

## 2020-02-20 NOTE — Patient Instructions (Signed)
Future orders are placed for next time.  We'll try and add as much onto last weeks labs as possible.

## 2020-02-23 ENCOUNTER — Other Ambulatory Visit: Payer: Self-pay

## 2020-02-23 ENCOUNTER — Ambulatory Visit (INDEPENDENT_AMBULATORY_CARE_PROVIDER_SITE_OTHER): Payer: Medicare Other

## 2020-02-23 DIAGNOSIS — E538 Deficiency of other specified B group vitamins: Secondary | ICD-10-CM

## 2020-02-23 NOTE — Progress Notes (Signed)
Cyanocobalamin injection given to right deltoid.  Patient tolerated well. 

## 2020-02-27 ENCOUNTER — Ambulatory Visit: Payer: Medicare Other

## 2020-03-05 ENCOUNTER — Other Ambulatory Visit: Payer: Self-pay

## 2020-03-05 ENCOUNTER — Ambulatory Visit
Admission: RE | Admit: 2020-03-05 | Discharge: 2020-03-05 | Disposition: A | Discharge status: Home / Self Care | Payer: Medicare Other | Source: Ambulatory Visit | Attending: Gynecology | Admitting: Gynecology

## 2020-03-05 DIAGNOSIS — Z1231 Encounter for screening mammogram for malignant neoplasm of breast: Secondary | ICD-10-CM

## 2020-03-26 ENCOUNTER — Ambulatory Visit (INDEPENDENT_AMBULATORY_CARE_PROVIDER_SITE_OTHER): Payer: Medicare Other

## 2020-03-26 ENCOUNTER — Other Ambulatory Visit: Payer: Self-pay

## 2020-03-26 DIAGNOSIS — E538 Deficiency of other specified B group vitamins: Secondary | ICD-10-CM | POA: Diagnosis not present

## 2020-03-26 NOTE — Progress Notes (Signed)
Cyanocobalamin injection given to left deltoid.  Patient tolerated well. 

## 2020-04-26 ENCOUNTER — Other Ambulatory Visit: Payer: Self-pay

## 2020-04-26 ENCOUNTER — Ambulatory Visit: Payer: Medicare Other

## 2020-04-26 ENCOUNTER — Other Ambulatory Visit: Payer: Self-pay | Admitting: Gynecology

## 2020-04-26 ENCOUNTER — Ambulatory Visit (INDEPENDENT_AMBULATORY_CARE_PROVIDER_SITE_OTHER): Payer: Medicare Other | Admitting: *Deleted

## 2020-04-26 DIAGNOSIS — N644 Mastodynia: Secondary | ICD-10-CM

## 2020-04-26 DIAGNOSIS — E538 Deficiency of other specified B group vitamins: Secondary | ICD-10-CM | POA: Diagnosis not present

## 2020-04-26 NOTE — Progress Notes (Signed)
Patient in today for monthly B 12 injection. 1000 mcg given IM in right deltoid. Patient tolerated well.  

## 2020-05-24 ENCOUNTER — Other Ambulatory Visit: Payer: Self-pay

## 2020-05-24 ENCOUNTER — Ambulatory Visit (INDEPENDENT_AMBULATORY_CARE_PROVIDER_SITE_OTHER): Payer: Medicare Other

## 2020-05-24 DIAGNOSIS — E538 Deficiency of other specified B group vitamins: Secondary | ICD-10-CM

## 2020-05-24 NOTE — Progress Notes (Signed)
Cyanocobalamin injection given to left deltoid.  Patient tolerated well. 

## 2020-05-29 ENCOUNTER — Other Ambulatory Visit: Payer: Medicare Other

## 2020-06-05 ENCOUNTER — Other Ambulatory Visit: Payer: Self-pay

## 2020-06-05 ENCOUNTER — Ambulatory Visit: Payer: Medicare Other

## 2020-06-05 ENCOUNTER — Ambulatory Visit
Admission: RE | Admit: 2020-06-05 | Discharge: 2020-06-05 | Disposition: A | Discharge status: Home / Self Care | Payer: Medicare Other | Source: Ambulatory Visit | Attending: Gynecology | Admitting: Gynecology

## 2020-06-05 DIAGNOSIS — N644 Mastodynia: Secondary | ICD-10-CM

## 2020-06-25 ENCOUNTER — Other Ambulatory Visit: Payer: Self-pay

## 2020-06-25 ENCOUNTER — Ambulatory Visit (INDEPENDENT_AMBULATORY_CARE_PROVIDER_SITE_OTHER): Payer: Medicare Other | Admitting: *Deleted

## 2020-06-25 DIAGNOSIS — E538 Deficiency of other specified B group vitamins: Secondary | ICD-10-CM

## 2020-06-25 NOTE — Progress Notes (Signed)
Pt tolerated B12 well 

## 2020-07-26 ENCOUNTER — Ambulatory Visit: Payer: Medicare Other

## 2020-07-26 ENCOUNTER — Other Ambulatory Visit: Payer: Self-pay

## 2020-07-26 ENCOUNTER — Ambulatory Visit (INDEPENDENT_AMBULATORY_CARE_PROVIDER_SITE_OTHER): Payer: Medicare Other

## 2020-07-26 DIAGNOSIS — E538 Deficiency of other specified B group vitamins: Secondary | ICD-10-CM | POA: Diagnosis not present

## 2020-07-26 NOTE — Progress Notes (Signed)
Cyanocobalamin injection given to left deltoid.  Patient tolerated well. 

## 2020-08-23 ENCOUNTER — Telehealth: Payer: Self-pay | Admitting: Family Medicine

## 2020-08-23 ENCOUNTER — Encounter: Payer: Self-pay | Admitting: Family Medicine

## 2020-08-23 NOTE — Telephone Encounter (Signed)
Pt would like to come in and get labs before appt. Please add orders and call pt when they are in.

## 2020-08-23 NOTE — Telephone Encounter (Signed)
Orders have been placed and note sent to pt through mychart.

## 2020-08-27 ENCOUNTER — Other Ambulatory Visit: Payer: Medicare Other

## 2020-08-27 ENCOUNTER — Other Ambulatory Visit: Payer: Self-pay

## 2020-08-27 ENCOUNTER — Ambulatory Visit (INDEPENDENT_AMBULATORY_CARE_PROVIDER_SITE_OTHER): Payer: Medicare Other | Admitting: *Deleted

## 2020-08-27 DIAGNOSIS — Z780 Asymptomatic menopausal state: Secondary | ICD-10-CM

## 2020-08-27 DIAGNOSIS — E7800 Pure hypercholesterolemia, unspecified: Secondary | ICD-10-CM

## 2020-08-27 DIAGNOSIS — E034 Atrophy of thyroid (acquired): Secondary | ICD-10-CM

## 2020-08-27 DIAGNOSIS — E538 Deficiency of other specified B group vitamins: Secondary | ICD-10-CM

## 2020-08-27 DIAGNOSIS — M8580 Other specified disorders of bone density and structure, unspecified site: Secondary | ICD-10-CM

## 2020-08-28 LAB — CBC
Hematocrit: 40.4 % (ref 34.0–46.6)
Hemoglobin: 13.4 g/dL (ref 11.1–15.9)
MCH: 32.2 pg (ref 26.6–33.0)
MCHC: 33.2 g/dL (ref 31.5–35.7)
MCV: 97 fL (ref 79–97)
Platelets: 265 10*3/uL (ref 150–450)
RBC: 4.16 x10E6/uL (ref 3.77–5.28)
RDW: 12.2 % (ref 11.7–15.4)
WBC: 5.5 10*3/uL (ref 3.4–10.8)

## 2020-08-28 LAB — LIPID PANEL
Chol/HDL Ratio: 2.5 ratio (ref 0.0–4.4)
Cholesterol, Total: 145 mg/dL (ref 100–199)
HDL: 57 mg/dL (ref >39)
LDL Chol Calc (NIH): 75 mg/dL (ref 0–99)
Triglycerides: 67 mg/dL (ref 0–149)
VLDL Cholesterol Cal: 13 mg/dL (ref 5–40)

## 2020-08-28 LAB — VITAMIN B12: Vitamin B-12: 868 pg/mL (ref 232–1245)

## 2020-08-28 LAB — CMP14+EGFR
ALT: 12 IU/L (ref 0–32)
AST: 15 IU/L (ref 0–40)
Albumin/Globulin Ratio: 2 (ref 1.2–2.2)
Albumin: 4.7 g/dL (ref 3.8–4.8)
Alkaline Phosphatase: 53 IU/L (ref 44–121)
BUN/Creatinine Ratio: 17 (ref 12–28)
BUN: 12 mg/dL (ref 8–27)
Bilirubin Total: 0.8 mg/dL (ref 0.0–1.2)
CO2: 25 mmol/L (ref 20–29)
Calcium: 9 mg/dL (ref 8.7–10.3)
Chloride: 103 mmol/L (ref 96–106)
Creatinine, Ser: 0.72 mg/dL (ref 0.57–1.00)
Globulin, Total: 2.3 g/dL (ref 1.5–4.5)
Glucose: 84 mg/dL (ref 65–99)
Potassium: 4.1 mmol/L (ref 3.5–5.2)
Sodium: 142 mmol/L (ref 134–144)
Total Protein: 7 g/dL (ref 6.0–8.5)
eGFR: 92 mL/min/{1.73_m2} (ref >59)

## 2020-08-28 LAB — THYROID PANEL WITH TSH
Free Thyroxine Index: 3.1 (ref 1.2–4.9)
T3 Uptake Ratio: 27 % (ref 24–39)
T4, Total: 11.3 ug/dL (ref 4.5–12.0)
TSH: 1.4 u[IU]/mL (ref 0.450–4.500)

## 2020-08-28 LAB — VITAMIN D 25 HYDROXY (VIT D DEFICIENCY, FRACTURES): Vit D, 25-Hydroxy: 59.5 ng/mL (ref 30.0–100.0)

## 2020-08-29 ENCOUNTER — Encounter: Payer: Self-pay | Admitting: Family Medicine

## 2020-08-29 ENCOUNTER — Other Ambulatory Visit: Payer: Self-pay

## 2020-08-29 ENCOUNTER — Ambulatory Visit (INDEPENDENT_AMBULATORY_CARE_PROVIDER_SITE_OTHER): Payer: Medicare Other | Admitting: Family Medicine

## 2020-08-29 VITALS — BP 136/56 | HR 77 | Temp 98.6°F | Ht 62.0 in | Wt 125.6 lb

## 2020-08-29 DIAGNOSIS — I700 Atherosclerosis of aorta: Secondary | ICD-10-CM

## 2020-08-29 DIAGNOSIS — E034 Atrophy of thyroid (acquired): Secondary | ICD-10-CM

## 2020-08-29 DIAGNOSIS — E7800 Pure hypercholesterolemia, unspecified: Secondary | ICD-10-CM | POA: Diagnosis not present

## 2020-08-29 DIAGNOSIS — R030 Elevated blood-pressure reading, without diagnosis of hypertension: Secondary | ICD-10-CM | POA: Diagnosis not present

## 2020-08-29 DIAGNOSIS — Z1389 Encounter for screening for other disorder: Secondary | ICD-10-CM

## 2020-08-29 DIAGNOSIS — M810 Age-related osteoporosis without current pathological fracture: Secondary | ICD-10-CM

## 2020-08-29 LAB — URINALYSIS
Bilirubin, UA: NEGATIVE
Glucose, UA: NEGATIVE
Ketones, UA: NEGATIVE
Nitrite, UA: NEGATIVE
Protein,UA: NEGATIVE
RBC, UA: NEGATIVE
Specific Gravity, UA: 1.015 (ref 1.005–1.030)
Urobilinogen, Ur: 0.2 mg/dL (ref 0.2–1.0)
pH, UA: 5.5 (ref 5.0–7.5)

## 2020-08-29 NOTE — Patient Instructions (Signed)
You will be due for your next bone density scan later this year.  Plan for this at your physical in December.  Fasting labs have been ordered

## 2020-08-29 NOTE — Progress Notes (Signed)
Subjective: CC: 42-monthcheckup for hypothyroidism, hyperlipidemia PCP: GJanora Norlander DO HVPX:TGGYIRLake is a 70y.o. female presenting to clinic today for:  1.  Hypothyroidism Patient is compliant with Synthroid 88 mcg daily.  No reports of tremor but she had some abdominal discomfort with associated diarrhea a few days ago that is since resolved.  She thought this perhaps was related to her stress.  Her mother has not been in good health and she is the primary healthcare power of attorney.  2.  Osteoporosis Treated with Boniva 150 mg daily.  [facility]  3.  Hyperlipidemia Compliant with Crestor 2.5 mg Mondays, Wednesdays and Fridays.  No reports of chest pain, shortness of breath.  Abdominal pain as above has resolved.   ROS: Per HPI  No Known Allergies Past Medical History:  Diagnosis Date  . GERD (gastroesophageal reflux disease)   . Thyroid disease     Current Outpatient Medications:  .  calcium carbonate 1250 MG capsule, Take 250 mg by mouth daily. , Disp: , Rfl:  .  Cholecalciferol (VITAMIN D) 2000 UNITS CAPS, Take 1 capsule by mouth daily., Disp: , Rfl:  .  DEXILANT 60 MG capsule, Take 60 mg by mouth daily., Disp: , Rfl:  .  famotidine (PEPCID) 40 MG tablet, TAKE 1 TABLET BY MOUTH EVERY DAY, Disp: , Rfl:  .  ibandronate (BONIVA) 150 MG tablet, Take 150 mg by mouth every 30 (thirty) days., Disp: , Rfl:  .  levothyroxine (SYNTHROID) 88 MCG tablet, Take 1 tablet (88 mcg total) by mouth daily before breakfast., Disp: 90 tablet, Rfl: 3 .  RESTASIS 0.05 % ophthalmic emulsion, , Disp: , Rfl: 3 .  rosuvastatin (CRESTOR) 5 MG tablet, Take 1/2 tablet on Mon, Wed, Fridays., Disp: 18 tablet, Rfl: 3  Current Facility-Administered Medications:  .  cyanocobalamin ((VITAMIN B-12)) injection 1,000 mcg, 1,000 mcg, Intramuscular, Q30 days, GRonnie DossM, DO, 1,000 mcg at 08/27/20 0848 Social History   Socioeconomic  History  . Marital status: Married    Spouse name: DAssociate Professor  . Number of children: 2  . Years of education: 187 . Highest education level: Associate degree: academic program  Occupational History  . Occupation: Retired  Tobacco Use  . Smoking status: Never Smoker  . Smokeless tobacco: Never Used  Vaping Use  . Vaping Use: Never used  Substance and Sexual Activity  . Alcohol use: No  . Drug use: No  . Sexual activity: Yes  Other Topics Concern  . Not on file  Social History Narrative  . Not on file   Social Determinants of Health   Financial Resource Strain: Low Risk   . Difficulty of Paying Living Expenses: Not hard at all  Food Insecurity: No Food Insecurity  . Worried About RCharity fundraiserin the Last Year: Never true  . Ran Out of Food in the Last Year: Never true  Transportation Needs: No Transportation Needs  . Lack of Transportation (Medical): No  . Lack of Transportation (Non-Medical): No  Physical Activity: Sufficiently Active  . Days of Exercise per Week: 7 days  . Minutes of Exercise per Session: 60 min  Stress: No Stress Concern Present  . Feeling of Stress : Not at all  Social Connections: Socially Integrated  . Frequency of Communication with Friends and Family: More than three times a week  . Frequency of Social Gatherings with Friends and Family: More than three  times a week  . Attends Religious Services: More than 4 times per year  . Active Member of Clubs or Organizations: Yes  . Attends Archivist Meetings: More than 4 times per year  . Marital Status: Married  Human resources officer Violence: Not At Risk  . Fear of Current or Ex-Partner: No  . Emotionally Abused: No  . Physically Abused: No  . Sexually Abused: No   Family History  Problem Relation Age of Onset  . Hypertension Mother   . Hyperlipidemia Mother   . Hypertension Father     Objective: Office vital signs reviewed. BP (!) 136/56   Pulse 77   Temp 98.6 F (37 C)   Ht  5' 2" (1.575 m)   Wt 125 lb 9.6 oz (57 kg)   SpO2 100%   BMI 22.97 kg/m   Physical Examination:  General: Awake, alert, well nourished, No acute distress HEENT: Normal; sclera white.  No exophthalmos.  No neck masses Cardio: regular rate and rhythm, S1S2 heard, no murmurs appreciated Pulm: clear to auscultation bilaterally, no wheezes, rhonchi or rales; normal work of breathing on room air GI: soft, non-tender, non-distended, bowel sounds present x4, no hepatomegaly, no splenomegaly, no masses Extremities: warm, well perfused, No edema, cyanosis or clubbing; +2 pulses bilaterally Skin: dry; intact; no rashes or lesions; normal temperature Neuro: No tremor  Assessment/ Plan: 70 y.o. female   Hypothyroidism due to acquired atrophy of thyroid - Plan: TSH, T4, free  White coat syndrome without hypertension  Pure hypercholesterolemia - Plan: CMP14+EGFR, Lipid panel  Thoracic aorta atherosclerosis (HCC) - Plan: CMP14+EGFR, Lipid panel  Age-related osteoporosis without current pathological fracture - Plan: DG [facility] DEXA  Screening for blood or protein in urine - Plan: Urinalysis  Asymptomatic from a thyroid standpoint.  We reviewed her labs which were normal.  Future orders for her 40-monthannual exam have been ordered  Blood pressure technically stage I hypertension but her home blood pressures today were totally within normal range.  Suspect that this is whitecoat hypertension  Lipid panel was unchanged.  She continues to want to pursue every 6 month lipid panels and this has been future ordered  Continue statin given history of aortic atherosclerosis  Treated with Boniva for osteoporosis.  DEXA scan ordered and should be obtained sometime in November or December  Patient wished to have urinary screening as this is since been discontinued at her [facility] office  No orders of the defined types were placed in this encounter.  No orders of the defined types were placed in this  encounter.    AJanora Norlander DO [phone]

## 2020-09-26 ENCOUNTER — Ambulatory Visit (INDEPENDENT_AMBULATORY_CARE_PROVIDER_SITE_OTHER): Payer: Medicare Other

## 2020-09-26 ENCOUNTER — Other Ambulatory Visit: Payer: Self-pay

## 2020-09-26 DIAGNOSIS — E538 Deficiency of other specified B group vitamins: Secondary | ICD-10-CM

## 2020-09-26 NOTE — Progress Notes (Signed)
Cyanocobalamin injection given to left deltoid.  Patient tolerated well. 

## 2020-10-17 ENCOUNTER — Ambulatory Visit (INDEPENDENT_AMBULATORY_CARE_PROVIDER_SITE_OTHER): Payer: Medicare Other | Admitting: Family Medicine

## 2020-10-17 ENCOUNTER — Ambulatory Visit (INDEPENDENT_AMBULATORY_CARE_PROVIDER_SITE_OTHER): Payer: Medicare Other

## 2020-10-17 ENCOUNTER — Other Ambulatory Visit: Payer: Self-pay

## 2020-10-17 VITALS — BP 146/67 | HR 92 | Ht 62.0 in | Wt 123.0 lb

## 2020-10-17 DIAGNOSIS — R079 Chest pain, unspecified: Secondary | ICD-10-CM

## 2020-10-17 NOTE — Progress Notes (Signed)
Subjective: CC: Right-sided chest pain PCP: Karla Ip, DO ZOX:WRUEAV [place] is a 70 y.o. female presenting to clinic today for:  1.  Right-sided chest pain Patient reports that she is been having some intermittent right-sided chest pain that goes from mid chest to the right breast.  This onset on Friday.  She states that a few days prior her dog jerked the leash and pulled her forward.  However, she cannot identify any other inciting events.  Her acid reflux has been stable with use of Dexilant and Pepcid.  She denies any associated shortness of breath, diaphoresis, nausea or vomiting with the chest pain.  It does not seem to be associated by any particular activities.  It is reproducible upon palpation.   ROS: Per HPI  No Known Allergies Past Medical History:  Diagnosis Date   GERD (gastroesophageal reflux disease)    Thyroid disease     Current Outpatient Medications:    calcium carbonate 1250 MG capsule, Take 250 mg by mouth daily. , Disp: , Rfl:    Cholecalciferol (VITAMIN D) 2000 UNITS CAPS, Take 1 capsule by mouth daily., Disp: , Rfl:    DEXILANT 60 MG capsule, Take 60 mg by mouth daily., Disp: , Rfl:    famotidine (PEPCID) 40 MG tablet, TAKE 1 TABLET BY MOUTH EVERY DAY, Disp: , Rfl:    ibandronate (BONIVA) 150 MG tablet, Take 150 mg by mouth every 30 (thirty) days., Disp: , Rfl:    levothyroxine (SYNTHROID) 88 MCG tablet, Take 1 tablet (88 mcg total) by mouth daily before breakfast., Disp: 90 tablet, Rfl: 3   RESTASIS 0.05 % ophthalmic emulsion, , Disp: , Rfl: 3   rosuvastatin (CRESTOR) 5 MG tablet, Take 1/2 tablet on Mon, Wed, Fridays., Disp: 18 tablet, Rfl: 3  Current Facility-Administered Medications:    cyanocobalamin ((VITAMIN B-12)) injection 1,000 mcg, 1,000 mcg, Intramuscular, Q30 days, Karla [name] M, DO, 1,000 mcg at 09/26/20 4098 Social History   Socioeconomic History   Marital status: Married    Spouse name: Museum/gallery conservator    Number of children: 2    Years of education: 14   Highest education level: Tax adviser degree: academic program  Occupational History   Occupation: Retired  Tobacco Use   Smoking status: Never   Smokeless tobacco: Never  Building services engineer Use: Never used  Substance and Sexual Activity   Alcohol use: No   Drug use: No   Sexual activity: Yes  Other Topics Concern   Not on file  Social History Narrative   Not on file   Social Determinants of Health   Financial Resource Strain: Low Risk    Difficulty of Paying Living Expenses: Not hard at all  Food Insecurity: No Food Insecurity   Worried About Programme researcher, broadcasting/film/video in the Last Year: Never true   Ran Out of Food in the Last Year: Never true  Transportation Needs: No Transportation Needs   Lack of Transportation (Medical): No   Lack of Transportation (Non-Medical): No  Physical Activity: Sufficiently Active   Days of Exercise per Week: 7 days   Minutes of Exercise per Session: 60 min  Stress: No Stress Concern Present   Feeling of Stress : Not at all  Social Connections: Socially Integrated   Frequency of Communication with Friends and Family: More than three times a week   Frequency of Social Gatherings with Friends and Family: More than three times a week   Attends Religious Services: More than 4 times  per year   Active Member of Clubs or Organizations: Yes   Attends Engineer, structural: More than 4 times per year   Marital Status: Married  Catering manager Violence: Not At Risk   Fear of Current or Ex-Partner: No   Emotionally Abused: No   Physically Abused: No   Sexually Abused: No   Family History  Problem Relation Age of Onset   Hypertension Mother    Hyperlipidemia Mother    Hypertension Father     Objective: Office vital signs reviewed. BP (!) 146/67   Pulse 92   Ht 5\' 2"  (1.575 m)   Wt 123 lb (55.8 kg)   SpO2 99%   BMI 22.50 kg/m   Physical Examination:  General: Awake, alert, well nourished, No acute  distress HEENT: Normal, sclera white Cardio: regular rate and rhythm, S1S2 heard, no murmurs appreciated Pulm: clear to auscultation bilaterally, no wheezes, rhonchi or rales; normal work of breathing on room air MSK: Tenderness to palpation along the anterior and anterior lateral aspects of right ribs 3, 4 and 5.  There are no palpable defects in this area  Assessment/ Plan: 70 y.o. female   Chest pain, unspecified type - Plan: EKG 12-Lead, DG Chest 2 View  EKG demonstrated no evidence of ischemia.  Personal view of chest x-ray demonstrated no evidence of occult fracture.  Suspect this is costochondritis.  We discussed red flag signs and symptoms which would warrant further evaluation emergency department the patient voiced understanding.  She will follow-up urine  Orders Placed This Encounter  Procedures   EKG 12-Lead   No orders of the defined types were placed in this encounter.    81, DO [facility] [phone]

## 2020-10-29 ENCOUNTER — Ambulatory Visit (INDEPENDENT_AMBULATORY_CARE_PROVIDER_SITE_OTHER): Payer: Medicare Other

## 2020-10-29 ENCOUNTER — Other Ambulatory Visit: Payer: Self-pay

## 2020-10-29 DIAGNOSIS — E538 Deficiency of other specified B group vitamins: Secondary | ICD-10-CM

## 2020-10-29 NOTE — Progress Notes (Signed)
Cyanocobalamin injection given to left deltoid.  Patient tolerated well. 

## 2020-11-21 ENCOUNTER — Ambulatory Visit (INDEPENDENT_AMBULATORY_CARE_PROVIDER_SITE_OTHER): Payer: Medicare Other

## 2020-11-21 VITALS — Ht 62.0 in | Wt 123.0 lb

## 2020-11-21 DIAGNOSIS — Z0000 Encounter for general adult medical examination without abnormal findings: Secondary | ICD-10-CM

## 2020-11-21 NOTE — Patient Instructions (Signed)
Karla [name] , Thank you for taking time to come for your Medicare Wellness Visit. I appreciate your ongoing commitment to your health goals. Please review the following plan we discussed and let me know if I can assist you in the future.   Screening recommendations/referrals: Colonoscopy: 05/04/17, every 5 years Mammogram: 06/05/20, every year Bone Density: 01/25/2019, due every 2 years Recommended yearly ophthalmology/optometry visit for glaucoma screening and checkup Recommended yearly dental visit for hygiene and checkup  Vaccinations: Influenza vaccine: 01/04/20, Due in fall Pneumococcal vaccine: 01/04/14 03/29/2018 Tdap vaccine: 12/01/13 Shingles vaccine: Shingrix discussed. Please contact your pharmacy for coverage information.     Covid-19:04/13/19 05/02/19 01/04/20  Advanced directives: Advance directive discussed with you today. Even though you declined this today, please call our office should you change your mind, and we can give you the proper paperwork for you to fill out.   Conditions/risks identified: Keep up the great work!   Next appointment: Follow up in one year for your annual wellness visit    Preventive Care 65 Years and Older, Female Preventive care refers to lifestyle choices and visits with your health care provider that can promote health and wellness. What does preventive care include? A yearly physical exam. This is also called an annual well check. Dental exams once or twice a year. Routine eye exams. Ask your health care provider how often you should have your eyes checked. Personal lifestyle choices, including: Daily care of your teeth and gums. Regular physical activity. Eating a healthy diet. Avoiding tobacco and drug use. Limiting alcohol use. Practicing safe sex. Taking low-dose aspirin every day. Taking vitamin and mineral supplements as recommended by your health care provider. What happens during an annual well check? The services and screenings  done by your health care provider during your annual well check will depend on your age, overall health, lifestyle risk factors, and family history of disease. Counseling  Your health care provider may ask you questions about your: Alcohol use. Tobacco use. Drug use. Emotional well-being. Home and relationship well-being. Sexual activity. Eating habits. History of falls. Memory and ability to understand (cognition). Work and work Astronomer. Reproductive health. Screening  You may have the following tests or measurements: Height, weight, and BMI. Blood pressure. Lipid and cholesterol levels. These may be checked every 5 years, or more frequently if you are over 66 years old. Skin check. Lung cancer screening. You may have this screening every year starting at age 57 if you have a 30-pack-year history of smoking and currently smoke or have quit within the past 15 years. Fecal occult blood test (FOBT) of the stool. You may have this test every year starting at age 41. Flexible sigmoidoscopy or colonoscopy. You may have a sigmoidoscopy every 5 years or a colonoscopy every 10 years starting at age 72. Hepatitis C blood test. Hepatitis B blood test. Sexually transmitted disease (STD) testing. Diabetes screening. This is done by checking your blood sugar (glucose) after you have not eaten for a while (fasting). You may have this done every 1-3 years. Bone density scan. This is done to screen for osteoporosis. You may have this done starting at age 55. Mammogram. This may be done every 1-2 years. Talk to your health care provider about how often you should have regular mammograms. Talk with your health care provider about your test results, treatment options, and if necessary, the need for more tests. Vaccines  Your health care provider may recommend certain vaccines, such as: Influenza vaccine. This is  recommended every year. Tetanus, diphtheria, and acellular pertussis (Tdap, Td)  vaccine. You may need a Td booster every 10 years. Zoster vaccine. You may need this after age 53. Pneumococcal 13-valent conjugate (PCV13) vaccine. One dose is recommended after age 70. Pneumococcal polysaccharide (PPSV23) vaccine. One dose is recommended after age 77. Talk to your health care provider about which screenings and vaccines you need and how often you need them. This information is not intended to replace advice given to you by your health care provider. Make sure you discuss any questions you have with your health care provider. Document Released: 04/06/2015 Document Revised: 11/28/2015 Document Reviewed: 01/09/2015 Elsevier Interactive Patient Education  2017 [place] Prevention in the Home Falls can cause injuries. They can happen to people of all ages. There are many things you can do to make your home safe and to help prevent falls. What can I do on the outside of my home? Regularly fix the edges of walkways and driveways and fix any cracks. Remove anything that might make you trip as you walk through a door, such as a raised step or threshold. Trim any bushes or trees on the path to your home. Use bright outdoor lighting. Clear any walking paths of anything that might make someone trip, such as rocks or tools. Regularly check to see if handrails are loose or broken. Make sure that both sides of any steps have handrails. Any raised decks and porches should have guardrails on the edges. Have any leaves, snow, or ice cleared regularly. Use sand or salt on walking paths during winter. Clean up any spills in your garage right away. This includes oil or grease spills. What can I do in the bathroom? Use night lights. Install grab bars by the toilet and in the tub and shower. Do not use towel bars as grab bars. Use non-skid mats or decals in the tub or shower. If you need to sit down in the shower, use a plastic, non-slip stool. Keep the floor dry. Clean up any  water that spills on the floor as soon as it happens. Remove soap buildup in the tub or shower regularly. Attach bath mats securely with double-sided non-slip rug tape. Do not have throw rugs and other things on the floor that can make you trip. What can I do in the bedroom? Use night lights. Make sure that you have a light by your bed that is easy to reach. Do not use any sheets or blankets that are too big for your bed. They should not hang down onto the floor. Have a firm chair that has side arms. You can use this for support while you get dressed. Do not have throw rugs and other things on the floor that can make you trip. What can I do in the kitchen? Clean up any spills right away. Avoid walking on wet floors. Keep items that you use a lot in easy-to-reach places. If you need to reach something above you, use a strong step stool that has a grab bar. Keep electrical cords out of the way. Do not use floor polish or wax that makes floors slippery. If you must use wax, use non-skid floor wax. Do not have throw rugs and other things on the floor that can make you trip. What can I do with my stairs? Do not leave any items on the stairs. Make sure that there are handrails on both sides of the stairs and use them. Fix handrails that are  broken or loose. Make sure that handrails are as long as the stairways. Check any carpeting to make sure that it is firmly attached to the stairs. Fix any carpet that is loose or worn. Avoid having throw rugs at the top or bottom of the stairs. If you do have throw rugs, attach them to the floor with carpet tape. Make sure that you have a light switch at the top of the stairs and the bottom of the stairs. If you do not have them, ask someone to add them for you. What else can I do to help prevent falls? Wear shoes that: Do not have high heels. Have rubber bottoms. Are comfortable and fit you well. Are closed at the toe. Do not wear sandals. If you use a  stepladder: Make sure that it is fully opened. Do not climb a closed stepladder. Make sure that both sides of the stepladder are locked into place. Ask someone to hold it for you, if possible. Clearly mark and make sure that you can see: Any grab bars or handrails. First and last steps. Where the edge of each step is. Use tools that help you move around (mobility aids) if they are needed. These include: Canes. Walkers. Scooters. Crutches. Turn on the lights when you go into a dark area. Replace any light bulbs as soon as they burn out. Set up your furniture so you have a clear path. Avoid moving your furniture around. If any of your floors are uneven, fix them. If there are any pets around you, be aware of where they are. Review your medicines with your doctor. Some medicines can make you feel dizzy. This can increase your chance of falling. Ask your doctor what other things that you can do to help prevent falls. This information is not intended to replace advice given to you by your health care provider. Make sure you discuss any questions you have with your health care provider. Document Released: 01/04/2009 Document Revised: 08/16/2015 Document Reviewed: 04/14/2014 Elsevier Interactive Patient Education  2017 [employer].

## 2020-11-21 NOTE — Progress Notes (Signed)
Subjective:   Karla [name] is a 70 y.o. female who presents for Medicare Annual (Subsequent) preventive examination. Virtual Visit via Telephone Note  I connected with  Karla [name] on 11/21/20 at 11:15 AM EDT by telephone and verified that I am speaking with the correct person using two identifiers.  Location: Patient: home Provider: [facility] Persons participating in the virtual visit: patient/Nurse Health Advisor   I discussed the limitations, risks, security and privacy concerns of performing an evaluation and management service by telephone and the availability of in person appointments. The patient expressed understanding and agreed to proceed.  Interactive audio and video telecommunications were attempted between this nurse and patient, however failed, due to patient having technical difficulties OR patient did not have access to video capability.  We continued and completed visit with audio only.  Some vital signs may be absent or patient reported.   Karla [name] E Karla Ovitt, LPN  Review of Systems     Cardiac Risk Factors include: advanced age (>[id number], 69>65 women)     Objective:    Today's Vitals   11/21/20 1108  Weight: 123 lb (55.8 kg)  Height: 5\' 2"  (1.575 m)   Body mass index is 22.5 kg/m.  Advanced Directives 11/21/2020 11/21/2019 11/16/2018  Does Patient Have a Medical Advance Directive? No No No  Would patient like information on creating a medical advance directive? No - Patient declined No - Patient declined Yes (MAU/Ambulatory/Procedural Areas - Information given)    Current Medications (verified) Outpatient Encounter Medications as of 11/21/2020  Medication Sig   calcium carbonate 1250 MG capsule Take 250 mg by mouth daily.    Cholecalciferol (VITAMIN D) 2000 UNITS CAPS Take 1 capsule by mouth daily.   DEXILANT 60 MG capsule Take 60 mg by mouth daily.   famotidine (PEPCID) 40 MG tablet TAKE 1 TABLET BY MOUTH EVERY DAY   ibandronate (BONIVA) 150 MG tablet Take 150 mg by  mouth every 30 (thirty) days.   levothyroxine (SYNTHROID) 88 MCG tablet Take 1 tablet (88 mcg total) by mouth daily before breakfast.   RESTASIS 0.05 % ophthalmic emulsion    rosuvastatin (CRESTOR) 5 MG tablet Take 1/2 tablet on Mon, Wed, Fridays.   Facility-Administered Encounter Medications as of 11/21/2020  Medication   cyanocobalamin ((VITAMIN B-12)) injection 1,000 mcg    Allergies (verified) Patient has no known allergies.   History: Past Medical History:  Diagnosis Date   GERD (gastroesophageal reflux disease)    Thyroid disease    Past Surgical History:  Procedure Laterality Date   CERVICAL SPINE SURGERY     herniated disc   Family History  Problem Relation Age of Onset   Heart disease Mother    Kidney disease Mother    Hypertension Mother    Hyperlipidemia Mother    Hypertension Father    Social History   Socioeconomic History   Marital status: Married    Spouse name: Museum/gallery conservatorDewight    Number of children: 2   Years of education: 14   Highest education level: Associate degree: academic program  Occupational History   Occupation: Retired  Tobacco Use   Smoking status: Never   Smokeless tobacco: Never  Building services engineerVaping Use   Vaping Use: Never used  Substance and Sexual Activity   Alcohol use: No   Drug use: No   Sexual activity: Yes  Other Topics Concern   Not on file  Social History Narrative   Lives at home with husband. [place]   Social  Determinants of Health   Financial Resource Strain: Low Risk    Difficulty of Paying Living Expenses: Not hard at all  Food Insecurity: No Food Insecurity   Worried About Running Out of Food in the Last Year: Never true   Ran Out of Food in the Last Year: Never true  Transportation Needs: No Transportation Needs   Lack of Transportation (Medical): No   Lack of Transportation (Non-Medical): No  Physical Activity: Sufficiently Active   Days of Exercise per Week: 7 days   Minutes of Exercise per Session:  60 min  Stress: No Stress Concern Present   Feeling of Stress : Not at all  Social Connections: Socially Integrated   Frequency of Communication with Friends and Family: More than three times a week   Frequency of Social Gatherings with Friends and Family: More than three times a week   Attends Religious Services: More than 4 times per year   Active Member of [employer] or Organizations: Yes   Attends Engineer, structural: More than 4 times per year   Marital Status: Married    Tobacco Counseling Counseling given: Not Answered   Clinical Intake:     Pain : No/denies pain     BMI - recorded: 22.5 Nutritional Status: BMI of 19-24  Normal Nutritional Risks: None Diabetes: No  How often do you need to have someone help you when you read instructions, pamphlets, or other written materials from your doctor or pharmacy?: 1 - Never  Diabetic? NO  Interpreter Needed?: No  Information entered by :: Karla Placeres, LPN   Activities of Daily Living In your present state of health, do you have any difficulty performing the following activities: 11/21/2020  Hearing? N  Vision? Y  Difficulty concentrating or making decisions? N  Walking or climbing stairs? N  Dressing or bathing? N  Doing errands, shopping? N  Preparing Food and eating ? N  Using the Toilet? N  In the past six months, have you accidently leaked urine? N  Do you have problems with loss of bowel control? N  Managing your Medications? N  Managing your Finances? N  Housekeeping or managing your Housekeeping? N  Some recent data might be hidden    Patient Care Team: Raliegh Ip, DO as PCP - General (Family Medicine)  Indicate any recent Medical Services you may have received from other than [facility] providers in the past year (date may be approximate).     Assessment:   This is a routine wellness examination for Karla [name].  Hearing/Vision screen Hearing Screening - Comments:: No hearing issues Vision  Screening - Comments:: Glasses to drive, watch TV. Dr. Jeanie Sewer in [place] .  Dietary issues and exercise activities discussed: Current Exercise Habits: Home exercise routine, Type of exercise: walking, Time (Minutes): 60, Frequency (Times/Week): 7, Weekly Exercise (Minutes/Week): 420, Intensity: Mild, Exercise limited by: None identified   Goals Addressed             This Visit's Progress    Exercise 3x per week (30 min per time)   On track    Continue to exercise for at least 30 minutes, 3 times weekly.        Depression Screen PHQ 2/9 Scores 11/21/2020 10/17/2020 08/29/2020 02/20/2020 11/21/2019 08/17/2019 06/16/2019  PHQ - 2 Score 0 0 0 0 0 0 0  PHQ- 9 Score - - - 0 - - -    Fall Risk Fall Risk  11/21/2020 10/17/2020 08/29/2020 02/20/2020 11/21/2019  Falls in  the past year? 0 0 0 0 0  Number falls in past yr: 0 - - - -  Injury with Fall? 0 - - - -  Risk for fall due to : No Fall Risks - - - -  Follow up Falls prevention discussed - - - -    FALL RISK PREVENTION PERTAINING TO THE HOME:  Any stairs in or around the home? Yes  If so, are there any without handrails? No  Home free of loose throw rugs in walkways, pet beds, electrical cords, etc? Yes  Adequate lighting in your home to reduce risk of falls? Yes   ASSISTIVE DEVICES UTILIZED TO PREVENT FALLS:  Life alert? No  Use of a cane, walker or w/c? No  Grab bars in the bathroom? Yes  Shower chair or bench in shower? No  Elevated toilet seat or a handicapped toilet? No   TIMED UP AND GO:  Was the test performed? No Phone visit    Cognitive Function: Normal cognitive status assessed by direct observation by this Nurse Health Advisor. No abnormalities found.       6CIT Screen 11/21/2019 11/16/2018  What Year? 0 points 0 points  What month? 0 points 0 points  What time? 0 points 0 points  Count back from 20 0 points 0 points  Months in reverse 0 points 0 points  Repeat phrase 0 points 0 points  Total Score 0 0     Immunizations Immunization History  Administered Date(s) Administered   Fluad Quad(high Dose 65+) 12/30/2018, 01/04/2020   Influenza, High Dose Seasonal PF 01/04/2018   Influenza,inj,Quad PF,6+ Mos 01/04/2014, 12/22/2014, 12/19/2015, 01/08/2017   PFIZER(Purple Top)SARS-COV-2 Vaccination 04/13/2019, 05/02/2019, 01/04/2020   Pneumococcal Conjugate-13 01/04/2014   Pneumococcal Polysaccharide-23 04/08/2018   Tdap 12/01/2013   Zoster, Live 06/05/2014    TDAP status: Up to date  Flu Vaccine status: Due, Education has been provided regarding the importance of this vaccine. Advised may receive this vaccine at local pharmacy or Health Dept. Aware to provide a copy of the vaccination record if obtained from local pharmacy or Health Dept. Verbalized acceptance and understanding.  Pneumococcal vaccine status: Up to date  Covid-19 vaccine status: Completed vaccines  Qualifies for Shingles Vaccine? Yes   Zostavax completed Yes   Shingrix Completed?: No.    Education has been provided regarding the importance of this vaccine. Patient has been advised to call insurance company to determine out of pocket expense if they have not yet received this vaccine. Advised may also receive vaccine at local pharmacy or Health Dept. Verbalized acceptance and understanding.  Screening Tests Health Maintenance  Topic Date Due   INFLUENZA VACCINE  10/22/2020   Zoster Vaccines- Shingrix (1 of 2) 11/29/2020 (Originally 10/21/2002)   COVID-19 Vaccine (4 - Booster for Pfizer series) 01/14/2021 (Originally 05/06/2020)   DEXA SCAN  01/24/2021   COLONOSCOPY (Pts 45-61yrs Insurance coverage will need to be confirmed)  05/04/2022   MAMMOGRAM  06/06/2022   TETANUS/TDAP  12/02/2023   Hepatitis C Screening  Completed   PNA vac Low Risk Adult  Completed   HPV VACCINES  Aged Out    Health Maintenance  Health Maintenance Due  Topic Date Due   INFLUENZA VACCINE  10/22/2020    Colorectal cancer screening: Type of  screening: Colonoscopy. Completed 05/04/2017. Repeat every 5 years  Mammogram status: Completed 06/05/20. Repeat every year  Bone Density status: Completed 01/25/2019. Results reflect: Bone density results: OSTEOPENIA. Repeat every 2 years.  Lung Cancer Screening: (Low  Dose CT Chest recommended if Age 65-80 years, 30 pack-year currently smoking OR have quit w/in 15years.) does not qualify.  Additional Screening:  Hepatitis C Screening: does qualify; Completed 01/05/14  Vision Screening: Recommended annual ophthalmology exams for early detection of glaucoma and other disorders of the eye. Is the patient up to date with their annual eye exam?  Yes  Who is the provider or what is the name of the office in which the patient attends annual eye exams? Dr. Jimmey Ralph in [place] If pt is not established with a provider, would they like to be referred to a provider to establish care? No .   Dental Screening: Recommended annual dental exams for proper oral hygiene  Community Resource Referral / Chronic Care Management: CRR required this visit?  No   CCM required this visit?  No      Plan:     I have personally reviewed and noted the following in the patient's chart:   Medical and social history Use of alcohol, tobacco or illicit drugs  Current medications and supplements including opioid prescriptions.  Functional ability and status Nutritional status Physical activity Advanced directives List of other physicians Hospitalizations, surgeries, and ER visits in previous 12 months Vitals Screenings to include cognitive, depression, and falls Referrals and appointments  In addition, I have reviewed and discussed with patient certain preventive protocols, quality metrics, and best practice recommendations. A written personalized care plan for preventive services as well as general preventive health recommendations were provided to patient.     [place] Constable, LPN   [id number]   Nurse  Notes: None

## 2020-11-29 ENCOUNTER — Ambulatory Visit (INDEPENDENT_AMBULATORY_CARE_PROVIDER_SITE_OTHER): Payer: Medicare Other

## 2020-11-29 ENCOUNTER — Other Ambulatory Visit: Payer: Self-pay

## 2020-11-29 DIAGNOSIS — E538 Deficiency of other specified B group vitamins: Secondary | ICD-10-CM | POA: Diagnosis not present

## 2020-11-29 NOTE — Progress Notes (Signed)
Cyanocobalamin injection given to right deltoid.  Patient tolerated well. 

## 2020-12-31 ENCOUNTER — Ambulatory Visit (INDEPENDENT_AMBULATORY_CARE_PROVIDER_SITE_OTHER): Payer: Medicare Other

## 2020-12-31 DIAGNOSIS — E538 Deficiency of other specified B group vitamins: Secondary | ICD-10-CM | POA: Diagnosis not present

## 2020-12-31 NOTE — Progress Notes (Signed)
Cyanocobalamin injection given to right deltoid.  Patient tolerated well. 

## 2021-01-30 ENCOUNTER — Other Ambulatory Visit: Payer: Self-pay | Admitting: Family Medicine

## 2021-01-31 ENCOUNTER — Other Ambulatory Visit: Payer: Self-pay

## 2021-01-31 ENCOUNTER — Ambulatory Visit (INDEPENDENT_AMBULATORY_CARE_PROVIDER_SITE_OTHER): Payer: Medicare Other

## 2021-01-31 DIAGNOSIS — Z23 Encounter for immunization: Secondary | ICD-10-CM

## 2021-01-31 DIAGNOSIS — E538 Deficiency of other specified B group vitamins: Secondary | ICD-10-CM | POA: Diagnosis not present

## 2021-01-31 NOTE — Progress Notes (Signed)
Cyanocobalamin injection given to left deltoid.  Patient tolerated well. 

## 2021-02-20 ENCOUNTER — Other Ambulatory Visit: Payer: Medicare Other

## 2021-02-20 DIAGNOSIS — I700 Atherosclerosis of aorta: Secondary | ICD-10-CM

## 2021-02-20 DIAGNOSIS — E034 Atrophy of thyroid (acquired): Secondary | ICD-10-CM

## 2021-02-20 DIAGNOSIS — E7800 Pure hypercholesterolemia, unspecified: Secondary | ICD-10-CM

## 2021-02-21 LAB — CMP14+EGFR
ALT: 13 IU/L (ref 0–32)
AST: 20 IU/L (ref 0–40)
Albumin/Globulin Ratio: 2 (ref 1.2–2.2)
Albumin: 4.7 g/dL (ref 3.8–4.8)
Alkaline Phosphatase: 53 IU/L (ref 44–121)
BUN/Creatinine Ratio: 18 (ref 12–28)
BUN: 13 mg/dL (ref 8–27)
Bilirubin Total: 0.7 mg/dL (ref 0.0–1.2)
CO2: 26 mmol/L (ref 20–29)
Calcium: 9.1 mg/dL (ref 8.7–10.3)
Chloride: 103 mmol/L (ref 96–106)
Creatinine, Ser: 0.72 mg/dL (ref 0.57–1.00)
Globulin, Total: 2.3 g/dL (ref 1.5–4.5)
Glucose: 82 mg/dL (ref 70–99)
Potassium: 4 mmol/L (ref 3.5–5.2)
Sodium: 142 mmol/L (ref 134–144)
Total Protein: 7 g/dL (ref 6.0–8.5)
eGFR: 91 mL/min/{1.73_m2} (ref >59)

## 2021-02-21 LAB — LIPID PANEL
Chol/HDL Ratio: 2.6 ratio (ref 0.0–4.4)
Cholesterol, Total: 143 mg/dL (ref 100–199)
HDL: 55 mg/dL (ref >39)
LDL Chol Calc (NIH): 75 mg/dL (ref 0–99)
Triglycerides: 64 mg/dL (ref 0–149)
VLDL Cholesterol Cal: 13 mg/dL (ref 5–40)

## 2021-02-21 LAB — TSH: TSH: 0.922 u[IU]/mL (ref 0.450–4.500)

## 2021-02-21 LAB — T4, FREE: Free T4: 1.5 ng/dL (ref 0.82–1.77)

## 2021-02-28 ENCOUNTER — Encounter: Payer: Self-pay | Admitting: Family Medicine

## 2021-02-28 ENCOUNTER — Other Ambulatory Visit: Payer: Self-pay

## 2021-02-28 ENCOUNTER — Ambulatory Visit (INDEPENDENT_AMBULATORY_CARE_PROVIDER_SITE_OTHER): Payer: Medicare Other | Admitting: Family Medicine

## 2021-02-28 ENCOUNTER — Other Ambulatory Visit (INDEPENDENT_AMBULATORY_CARE_PROVIDER_SITE_OTHER): Payer: Medicare Other

## 2021-02-28 VITALS — BP 137/63 | HR 85 | Temp 97.6°F | Ht 62.0 in | Wt 127.4 lb

## 2021-02-28 DIAGNOSIS — M810 Age-related osteoporosis without current pathological fracture: Secondary | ICD-10-CM

## 2021-02-28 DIAGNOSIS — Z0000 Encounter for general adult medical examination without abnormal findings: Secondary | ICD-10-CM

## 2021-02-28 DIAGNOSIS — E538 Deficiency of other specified B group vitamins: Secondary | ICD-10-CM

## 2021-02-28 DIAGNOSIS — E7800 Pure hypercholesterolemia, unspecified: Secondary | ICD-10-CM

## 2021-02-28 DIAGNOSIS — Z780 Asymptomatic menopausal state: Secondary | ICD-10-CM

## 2021-02-28 DIAGNOSIS — I700 Atherosclerosis of aorta: Secondary | ICD-10-CM | POA: Diagnosis not present

## 2021-02-28 DIAGNOSIS — E034 Atrophy of thyroid (acquired): Secondary | ICD-10-CM | POA: Diagnosis not present

## 2021-02-28 DIAGNOSIS — Z0001 Encounter for general adult medical examination with abnormal findings: Secondary | ICD-10-CM | POA: Diagnosis not present

## 2021-02-28 MED ORDER — LEVOTHYROXINE SODIUM 88 MCG PO TABS: 88.0000 ug | ORAL_TABLET | Freq: Every day | ORAL | 3 refills | Status: DC

## 2021-02-28 MED ORDER — ROSUVASTATIN CALCIUM 5 MG PO TABS: ORAL_TABLET | ORAL | 3 refills | Status: DC

## 2021-02-28 NOTE — Progress Notes (Addendum)
Karla [name] is a 70 y.o. female presents to office today for annual physical exam examination.    Concerns today include: 1. HTN w/ HLD Stable. No CP, SOB. Compliant with Crestor  2. Hypothyroidism Stable. Compliant with synthroid. No tremor, change in BMs.  3.  Osteoporosis Patient due for DEXA.  Compliant with Boniva.  Occupation: retired, Marital status: married, Substance use: none Diet: balanced, Exercise: stays active Last eye exam: February 2023 Last dental exam: UTD Last colonoscopy: UTD Last mammogram: UTD DEXA: needs Refills needed today: Crestor Synthroid Immunizations needed: Immunization History  Administered Date(s) Administered   Fluad Quad(high Dose 65+) 12/30/2018, 01/04/2020, 01/31/2021   Influenza, High Dose Seasonal PF 01/04/2018   Influenza,inj,Quad PF,6+ Mos 01/04/2014, 12/22/2014, 12/19/2015, 01/08/2017   PFIZER(Purple Top)SARS-COV-2 Vaccination 04/13/2019, 05/02/2019, 01/04/2020   Pneumococcal Conjugate-13 01/04/2014   Pneumococcal Polysaccharide-23 04/08/2018   Tdap 12/01/2013   Zoster, Live 06/05/2014    Past Medical History:  Diagnosis Date   GERD (gastroesophageal reflux disease)    Thyroid disease    Social History   Socioeconomic History   Marital status: Married    Spouse name: Associate Professor    Number of children: 2   Years of education: 14   Highest education level: Associate degree: academic program  Occupational History   Occupation: Retired  Tobacco Use   Smoking status: Never   Smokeless tobacco: Never  Scientific laboratory technician Use: Never used  Substance and Sexual Activity   Alcohol use: No   Drug use: No   Sexual activity: Yes  Other Topics Concern   Not on file  Social History Narrative   Lives at home with husband. Children in [place] and [place].   Social Determinants of Health   Financial Resource Strain: Low Risk    Difficulty of Paying Living Expenses: Not hard at all  Food Insecurity: No Food Insecurity   Worried  About Charity fundraiser in the Last Year: Never true   [place] in the Last Year: Never true  Transportation Needs: No Transportation Needs   Lack of Transportation (Medical): No   Lack of Transportation (Non-Medical): No  Physical Activity: Sufficiently Active   Days of Exercise per Week: 7 days   Minutes of Exercise per Session: 60 min  Stress: No Stress Concern Present   Feeling of Stress : Not at all  Social Connections: Socially Integrated   Frequency of Communication with Friends and Family: More than three times a week   Frequency of Social Gatherings with Friends and Family: More than three times a week   Attends Religious Services: More than 4 times per year   Active Member of [employer] or Organizations: Yes   Attends Music therapist: More than 4 times per year   Marital Status: Married  Human resources officer Violence: Not At Risk   Fear of Current or Ex-Partner: No   Emotionally Abused: No   Physically Abused: No   Sexually Abused: No   Past Surgical History:  Procedure Laterality Date   CERVICAL SPINE SURGERY     herniated disc   Family History  Problem Relation Age of Onset   Heart disease Mother    Kidney disease Mother    Hypertension Mother    Hyperlipidemia Mother    Hypertension Father     Current Outpatient Medications:    calcium carbonate 1250 MG capsule, Take 250 mg by mouth daily. , Disp: , Rfl:    Cholecalciferol (VITAMIN D) 2000 UNITS  CAPS, Take 1 capsule by mouth daily., Disp: , Rfl:    DEXILANT 60 MG capsule, Take 60 mg by mouth daily., Disp: , Rfl:    famotidine (PEPCID) 40 MG tablet, TAKE 1 TABLET BY MOUTH EVERY DAY, Disp: , Rfl:    ibandronate (BONIVA) 150 MG tablet, Take 150 mg by mouth every 30 (thirty) days., Disp: , Rfl:    levothyroxine (SYNTHROID) 88 MCG tablet, Take 1 tablet (88 mcg total) by mouth daily before breakfast., Disp: 90 tablet, Rfl: 3   RESTASIS 0.05 % ophthalmic emulsion, , Disp: , Rfl: 3   rosuvastatin  (CRESTOR) 5 MG tablet, TAKE 1/2 TABLET ON MONDAYS, WEDNESDAYS, AND FRIDAYS., Disp: 18 tablet, Rfl: 0  Current Facility-Administered Medications:    cyanocobalamin ((VITAMIN B-12)) injection 1,000 mcg, 1,000 mcg, Intramuscular, Q30 days, Dania Marsan M, DO, 1,000 mcg at 01/31/21 1111  No Known Allergies   Recent Results (from the past 2160 hour(s))  TSH     Status: None   Collection Time: 02/20/21  8:05 AM  Result Value Ref Range   TSH 0.922 0.450 - 4.500 uIU/mL  T4, free     Status: None   Collection Time: 02/20/21  8:05 AM  Result Value Ref Range   Free T4 1.50 0.82 - 1.77 ng/dL  Lipid panel     Status: None   Collection Time: 02/20/21  8:05 AM  Result Value Ref Range   Cholesterol, Total 143 100 - 199 mg/dL   Triglycerides 64 0 - 149 mg/dL   HDL 55 >39 mg/dL   VLDL Cholesterol Cal 13 5 - 40 mg/dL   LDL Chol Calc (NIH) 75 0 - 99 mg/dL   Chol/HDL Ratio 2.6 0.0 - 4.4 ratio    Comment:                                   T. Chol/HDL Ratio                                             Men  Women                               1/2 Avg.Risk  3.4    3.3                                   Avg.Risk  5.0    4.4                                2X Avg.Risk  9.6    7.1                                3X Avg.Risk 23.4   11.0   CMP14+EGFR     Status: None   Collection Time: 02/20/21  8:05 AM  Result Value Ref Range   Glucose 82 70 - 99 mg/dL   BUN 13 8 - 27 mg/dL   Creatinine, Ser 0.72 0.57 - 1.00 mg/dL   eGFR 91 >59 mL/min/1.73   BUN/Creatinine Ratio 18 12 - 28  Sodium 142 134 - 144 mmol/L   Potassium 4.0 3.5 - 5.2 mmol/L   Chloride 103 96 - 106 mmol/L   CO2 26 20 - 29 mmol/L   Calcium 9.1 8.7 - 10.3 mg/dL   Total Protein 7.0 6.0 - 8.5 g/dL   Albumin 4.7 3.8 - 4.8 g/dL   Globulin, Total 2.3 1.5 - 4.5 g/dL   Albumin/Globulin Ratio 2.0 1.2 - 2.2   Bilirubin Total 0.7 0.0 - 1.2 mg/dL   Alkaline Phosphatase 53 44 - 121 IU/L   AST 20 0 - 40 IU/L   ALT 13 0 - 32 IU/L    ROS: Review  of Systems A comprehensive review of systems was negative except for: Ears, nose, mouth, throat, and face: positive for occasional positional vertigo    Physical exam BP 137/63   Pulse 85   Temp 97.6 F (36.4 C) (Temporal)   Ht 5' 2" (1.575 m)   Wt 127 lb 6.4 oz (57.8 kg)   SpO2 99%   BMI 23.30 kg/m  General appearance: alert, cooperative, appears stated age, and no distress Head: Normocephalic, without obvious abnormality, atraumatic Eyes: negative findings: lids and lashes normal, conjunctivae and sclerae normal, corneas clear, and pupils equal, round, reactive to light and accomodation Ears: normal TM's and external ear canals both ears Nose: Nares normal. Septum midline. Mucosa normal. No drainage or sinus tenderness. Throat: lips, mucosa, and tongue normal; teeth and gums normal Neck: no adenopathy, no carotid bruit, supple, symmetrical, trachea midline, and thyroid not enlarged, symmetric, no tenderness/mass/nodules Back: symmetric, no curvature. ROM normal. No CVA tenderness. Lungs: clear to auscultation bilaterally Heart: regular rate and rhythm, S1, S2 normal, no murmur, click, rub or gallop Abdomen: soft, non-tender; bowel sounds normal; no masses,  no organomegaly Extremities: extremities normal, atraumatic, no cyanosis or edema Pulses: 2+ and symmetric Skin: Skin color, texture, turgor normal. No rashes or lesions Lymph nodes: Cervical, supraclavicular, and axillary nodes normal. Neurologic: Grossly normal Psych: mood stable, speech normal  Depression screen PHQ 2/9 02/28/2021 11/21/2020 10/17/2020  Decreased Interest 0 0 0  Down, Depressed, Hopeless 0 0 0  PHQ - 2 Score 0 0 0  Altered sleeping 0 - -  Tired, decreased energy 0 - -  Change in appetite 0 - -  Feeling bad or failure about yourself  0 - -  Trouble concentrating 0 - -  Moving slowly or fidgety/restless 0 - -  Suicidal thoughts 0 - -  PHQ-9 Score 0 - -  Difficult doing work/chores Not difficult at all -  -    Assessment/ Plan: Karla [name] here for annual physical exam.   Annual physical exam  Hypothyroidism due to acquired atrophy of thyroid - Plan: levothyroxine (SYNTHROID) 88 MCG tablet, TSH, T4, free  Pure hypercholesterolemia - Plan: rosuvastatin (CRESTOR) 5 MG tablet, Lipid panel, CMP14+EGFR  Thoracic aorta atherosclerosis (HCC) - Plan: Lipid panel, CMP14+EGFR  Age-related osteoporosis without current pathological fracture - Plan: CMP14+EGFR, VITAMIN D 25 Hydroxy (Vit-D Deficiency, Fractures)  B12 deficiency - Plan: Vitamin B12  Due for shingles vaccination but is planning on getting her next COVID booster soon so we will hold off on that.  DEXA scan updated today.  Anticipate that we will switch her off of Boniva onto an alternative since Boniva has shown inferiority in osteoporosis compared to its counterpart bisphosphonates  Her thyroid labs, cholesterol labs and metabolic panel were totally normal.  She will continue regimen as outlined.  Refills have been sent  She may   follow-up in 6 months, sooner if needed  Ashly M. Gottschalk, DO      

## 2021-03-04 ENCOUNTER — Ambulatory Visit (INDEPENDENT_AMBULATORY_CARE_PROVIDER_SITE_OTHER): Payer: Medicare Other | Admitting: *Deleted

## 2021-03-04 ENCOUNTER — Other Ambulatory Visit: Payer: Self-pay | Admitting: Family Medicine

## 2021-03-04 DIAGNOSIS — E538 Deficiency of other specified B group vitamins: Secondary | ICD-10-CM

## 2021-03-04 NOTE — Progress Notes (Signed)
Vitamin b12 injection given and patient tolerated well.  

## 2021-03-04 NOTE — Addendum Note (Signed)
Addended by: Raliegh Ip on: 03/04/2021 08:04 AM   Modules accepted: Level of Service

## 2021-03-06 ENCOUNTER — Encounter: Payer: Self-pay | Admitting: Family Medicine

## 2021-03-06 ENCOUNTER — Other Ambulatory Visit: Payer: Self-pay | Admitting: Family Medicine

## 2021-03-06 DIAGNOSIS — M810 Age-related osteoporosis without current pathological fracture: Secondary | ICD-10-CM

## 2021-03-06 MED ORDER — ALENDRONATE SODIUM 70 MG PO TABS: 70.0000 mg | ORAL_TABLET | ORAL | 4 refills | Status: DC

## 2021-03-06 NOTE — Telephone Encounter (Signed)
Ok to send

## 2021-03-13 ENCOUNTER — Ambulatory Visit (INDEPENDENT_AMBULATORY_CARE_PROVIDER_SITE_OTHER): Payer: Medicare Other | Admitting: Pharmacist

## 2021-03-13 ENCOUNTER — Encounter: Payer: Self-pay | Admitting: Pharmacist

## 2021-03-13 DIAGNOSIS — M8580 Other specified disorders of bone density and structure, unspecified site: Secondary | ICD-10-CM

## 2021-03-13 DIAGNOSIS — Z780 Asymptomatic menopausal state: Secondary | ICD-10-CM

## 2021-03-13 DIAGNOSIS — E559 Vitamin D deficiency, unspecified: Secondary | ICD-10-CM

## 2021-03-13 NOTE — Progress Notes (Signed)
° ° ° °  03/13/2021 Name: Karla [name] MRN: [id number] DOB: Oct 01, 1950   S:  78 YOF Presents for osteoPENIA (low bone mass) evaluation, education, and management Patient was referred and last seen by Primary Care Provider on 03/06/21.  Current Height:   5'2"     Max Lifetime Height:  5'2" Current Weight:   127lb      Ethnicity:Caucasian   HPI: Does pt already have a diagnosis of:  Osteopenia?  Yes Osteoporosis?  No  Back Pain?  No       Kyphosis?  No Prior fracture?  No Med(s) for Osteoporosis/Osteopenia:  boniva x5 yrs (last dose 02/21/21) Actonel about 32yrs ago Takes calcium (mostly dietary intake due to history of kidney stones Takes vitamin d 2,091mcg daily  PMH: HRT? No Steroid Use?  No Thyroid med?  Yes History of cancer?  No History of digestive disorders (ie Crohn's)?  No Current or previous eating disorders?  No Last Vitamin D Result:  59.5 (08/27/20) Last GFR Result:  91 (02/20/21)   FH/SH: Family history of osteoporosis?  Yes mom Parent with history of hip fracture?  No Family history of breast cancer?  No Exercise?  Yes walks/active Smoking?  No Alcohol?  No    Calcium Assessment Calcium Intake  # of servings/day  Calcium mg  Milk (8 oz) 2  x  300  = 600  Yogurt (4 oz) 0 x  200 = 0  Cheese (1 oz) 1 x  200 = 200  Other Calcium sources   250mg   Ca supplement Sometimes if not enough dietary = 0-600   Estimated calcium intake per day 1050-1650    DEXA Results FINDINGS:  AP LUMBAR SPINE L1-4 Bone Mineral Density (BMD):  0.980 g/cm2 Young Adult T-Score:  -1.7 Z-Score:  0.1   LEFT FEMUR NECK Bone Mineral Density (BMD):  0.711 g/cm2 Young Adult T-Score: -2.4 Z-Score:  -0.6    FRAX 10 year estimate: Total FX risk:  13.1%  (consider medication if >/= 20%) Hip FX risk:  3%  (consider medication if >/= 3%)  Assessment: ASSESSMENT: Patient's diagnostic category is LOW BONE MASS/OSTEOPENIA by [facility] Criteria.   FRACTURE RISK: INCREASED     Recommendations: 1.  START holiday--hold bisphosphonate for 6-12 months, rescan and consider prolia/fosamax as warranted  2.  continue calcium 1200mg  daily through supplementation or diet.  3.  continue weight bearing exercise - 30 minutes at least 4 days per week.   4.  Counseled and educated about fall risk and prevention.  Recheck DEXA:  1 year  Time spent counseling patient:  20 minutes

## 2021-03-15 ENCOUNTER — Ambulatory Visit (INDEPENDENT_AMBULATORY_CARE_PROVIDER_SITE_OTHER): Payer: Medicare Other | Admitting: Nurse Practitioner

## 2021-03-15 ENCOUNTER — Encounter: Payer: Self-pay | Admitting: Nurse Practitioner

## 2021-03-15 VITALS — BP 123/67 | HR 96 | Temp 101.8°F | Resp 20 | Ht 62.0 in | Wt 127.0 lb

## 2021-03-15 DIAGNOSIS — J029 Acute pharyngitis, unspecified: Secondary | ICD-10-CM | POA: Diagnosis not present

## 2021-03-15 DIAGNOSIS — J020 Streptococcal pharyngitis: Secondary | ICD-10-CM

## 2021-03-15 MED ORDER — AMOXICILLIN-POT CLAVULANATE 875-125 MG PO TABS: 1.0000 | ORAL_TABLET | Freq: Two times a day (BID) | ORAL | 0 refills | Status: DC

## 2021-03-15 NOTE — Progress Notes (Signed)
° °  Subjective:    Patient ID: Karla [name], female    DOB: Aug 14, 1950, 70 y.o.   MRN: [id number]   Chief Complaint: Sore Throat, Nausea, and Fever   HPI Patient come sin today c/o sore throat that started yesterday evening. Seemed to worsen through the now. This morning she had fever of 101.6 and body aches. Sh ehas not taken any OTC meds other than motrin fo rfever.    Review of Systems  Constitutional:  Positive for chills and fever.  HENT:  Positive for congestion and sore throat.   Respiratory:  Negative for cough.   Musculoskeletal:  Positive for myalgias.  Neurological:  Positive for headaches. Negative for dizziness.      Objective:   Physical Exam Constitutional:      Appearance: She is well-developed and normal weight.  HENT:     Right Ear: Tympanic membrane normal.     Left Ear: Tympanic membrane normal.     Nose: Congestion and rhinorrhea present.     Mouth/Throat:     Mouth: Mucous membranes are moist.     Pharynx: Posterior oropharyngeal erythema (mild) present.     Tonsils: No tonsillar exudate or tonsillar abscesses.  Cardiovascular:     Rate and Rhythm: Normal rate and regular rhythm.  Pulmonary:     Breath sounds: Normal breath sounds.  Skin:    General: Skin is warm and dry.  Neurological:     Mental Status: She is alert.  Psychiatric:        Mood and Affect: Mood normal.    BP 123/67    Pulse 96    Temp (!) 101.8 F (38.8 C) (Temporal)    Resp 20    Ht 5\' 2"  (1.575 m)    Wt 127 lb (57.6 kg)    SpO2 95%    BMI 23.23 kg/m   Strep positive      Assessment & Plan:   Asalee Barrette in today with chief complaint of Sore Throat, Nausea, and Fever   1. Sore throat - Rapid Strep Screen ([facility] ONLY) - Veritor Flu A/B Waived  2. Strep pharyngitis Force fluids Motrin or tylenol OTC OTC decongestant Throat lozenges if help New toothbrush in 3 days  Meds ordered this encounter  Medications   amoxicillin-clavulanate (AUGMENTIN) 875-125 MG  tablet    Sig: Take 1 tablet by mouth 2 (two) times daily.    Dispense:  14 tablet    Refill:  0    Order Specific Question:   Supervising Provider    Answer:   Karla [name] A [[id number]]       The above assessment and management plan was discussed with the patient. The patient verbalized understanding of and has agreed to the management plan. Patient is aware to call the clinic if symptoms persist or worsen. Patient is aware when to return to the clinic for a follow-up visit. Patient educated on when it is appropriate to go to the emergency department.   Mary-Margaret Arville Care, FNP

## 2021-03-15 NOTE — Patient Instructions (Signed)
Strep Throat, Adult ?Strep throat is an infection of the throat. It is caused by germs (bacteria). Strep throat is common during the cold months of the year. It mostly affects children who are 5-68 years old. However, people of all ages can get it at any time of the year. This infection spreads from person to person through coughing, sneezing, or having close contact. ?What are the causes? ?This condition is caused by the Streptococcus pyogenes germ. ?What increases the risk? ?You care for young children. Children are more likely to get strep throat and may spread it to others. ?You go to crowded places. Germs can spread easily in such places. ?You kiss or touch someone who has strep throat. ?What are the signs or symptoms? ?Fever or chills. ?Redness, swelling, or pain in the tonsils or throat. ?Pain or trouble when swallowing. ?White or yellow spots on the tonsils or throat. ?Tender glands in the neck and under the jaw. ?Bad breath. ?Red rash all over the body. This is rare. ?How is this treated? ?Medicines that kill germs (antibiotics). ?Medicines that treat pain or fever. These include: ?Ibuprofen or acetaminophen. ?Aspirin, only for people who are over the age of 18. ?Cough drops. ?Throat sprays. ?Follow these instructions at home: ?Medicines ? ?Take over-the-counter and prescription medicines only as told by your doctor. ?Take your antibiotic medicine as told by your doctor. Do not stop taking the antibiotic even if you start to feel better. ?Eating and drinking ? ?If you have trouble swallowing, eat soft foods until your throat feels better. ?Drink enough fluid to keep your pee (urine) pale yellow. ?To help with pain, you may have: ?Warm fluids, such as soup and tea. ?Cold fluids, such as frozen desserts or popsicles. ?General instructions ?Rinse your mouth (gargle) with a salt-water mixture 3-4 times a day or as needed. To make a salt-water mixture, dissolve ?-1 tsp (3-6 g) of salt in 1 cup (237 mL) of warm  water. ?Rest as much as you can. ?Stay home from work or school until you have been taking antibiotics for 24 hours. ?Do not smoke or use any products that contain nicotine or tobacco. If you need help quitting, ask your doctor. ?Keep all follow-up visits. ?How is this prevented? ? ?Do not share food, drinking cups, or personal items. They can cause the germs to spread. ?Wash your hands well with soap and water. Make sure that all people in your house wash their hands well. ?Have family members tested if they have a fever or a sore throat. They may need an antibiotic if they have strep throat. ?Contact a doctor if: ?You have swelling in your neck that keeps getting bigger. ?You get a rash, cough, or earache. ?You cough up a thick fluid that is green, yellow-brown, or bloody. ?You have pain that does not get better with medicine. ?Your symptoms get worse instead of getting better. ?You have a fever. ?Get help right away if: ?You vomit. ?You have a very bad headache. ?Your neck hurts or feels stiff. ?You have chest pain or are short of breath. ?You have drooling, very bad throat pain, or changes in your voice. ?Your neck is swollen, or the skin gets red and tender. ?Your mouth is dry, or you are peeing less than normal. ?You keep feeling more tired or have trouble waking up. ?Your joints are red or painful. ?These symptoms may be an emergency. Do not wait to see if the symptoms will go away. Get help right away. Call   your local emergency services (911 in the [place] ?Summary ?Strep throat is an infection of the throat. It is caused by germs (bacteria). ?This infection can spread from person to person through coughing, sneezing, or having close contact. ?Take your medicines, including antibiotics, as told by your doctor. Do not stop taking the antibiotic even if you start to feel better. ?To prevent the spread of germs, wash your hands well with soap and water. Have others do the same. Do not share food, drinking cups,  or personal items. ?Get help right away if you have a bad headache, chest pain, shortness of breath, a stiff or painful neck, or you vomit. ?This information is not intended to replace advice given to you by your health care provider. Make sure you discuss any questions you have with your health care provider. ?Document Revised: 07/03/2020 Document Reviewed: 07/03/2020 ?Elsevier Patient Education ? 2022 Elsevier Inc[employer] ? ?

## 2021-03-19 LAB — VERITOR FLU A/B WAIVED
Influenza A: NEGATIVE
Influenza B: NEGATIVE

## 2021-03-19 LAB — RAPID STREP SCREEN (MED CTR MEBANE ONLY): Strep Gp A Ag, IA W/Reflex: POSITIVE — AB

## 2021-04-04 ENCOUNTER — Ambulatory Visit (INDEPENDENT_AMBULATORY_CARE_PROVIDER_SITE_OTHER): Payer: Medicare Other

## 2021-04-04 DIAGNOSIS — E538 Deficiency of other specified B group vitamins: Secondary | ICD-10-CM

## 2021-04-04 NOTE — Progress Notes (Signed)
Cyanocobalamin injection given to left deltoid.  Patient tolerated well. 

## 2021-04-10 ENCOUNTER — Other Ambulatory Visit: Payer: Self-pay | Admitting: Gynecology

## 2021-04-10 DIAGNOSIS — Z1231 Encounter for screening mammogram for malignant neoplasm of breast: Secondary | ICD-10-CM

## 2021-04-12 ENCOUNTER — Other Ambulatory Visit: Payer: Self-pay | Admitting: Family Medicine

## 2021-04-12 DIAGNOSIS — E7800 Pure hypercholesterolemia, unspecified: Secondary | ICD-10-CM

## 2021-05-06 ENCOUNTER — Ambulatory Visit (INDEPENDENT_AMBULATORY_CARE_PROVIDER_SITE_OTHER): Payer: Medicare Other

## 2021-05-06 DIAGNOSIS — E538 Deficiency of other specified B group vitamins: Secondary | ICD-10-CM | POA: Diagnosis not present

## 2021-05-06 NOTE — Progress Notes (Signed)
Cyanocobalamin injection given to right deltoid.  Patient tolerated well. 

## 2021-05-22 ENCOUNTER — Encounter: Payer: Self-pay | Admitting: Family Medicine

## 2021-05-22 ENCOUNTER — Ambulatory Visit (INDEPENDENT_AMBULATORY_CARE_PROVIDER_SITE_OTHER): Payer: Medicare Other | Admitting: Family Medicine

## 2021-05-22 DIAGNOSIS — N3001 Acute cystitis with hematuria: Secondary | ICD-10-CM

## 2021-05-22 DIAGNOSIS — R1030 Lower abdominal pain, unspecified: Secondary | ICD-10-CM | POA: Diagnosis not present

## 2021-05-22 DIAGNOSIS — K5900 Constipation, unspecified: Secondary | ICD-10-CM | POA: Diagnosis not present

## 2021-05-22 LAB — URINALYSIS, ROUTINE W REFLEX MICROSCOPIC
Bilirubin, UA: NEGATIVE
Glucose, UA: NEGATIVE
Nitrite, UA: NEGATIVE
Specific Gravity, UA: 1.025 (ref 1.005–1.030)
Urobilinogen, Ur: 2 mg/dL — ABNORMAL HIGH (ref 0.2–1.0)
pH, UA: 5.5 (ref 5.0–7.5)

## 2021-05-22 LAB — MICROSCOPIC EXAMINATION: Renal Epithel, UA: NONE SEEN /hpf

## 2021-05-22 MED ORDER — CEFDINIR 300 MG PO CAPS: 300.0000 mg | ORAL_CAPSULE | Freq: Two times a day (BID) | ORAL | 0 refills | Status: AC

## 2021-05-22 NOTE — Progress Notes (Signed)
? ?Virtual Visit via Telephone Note ? ?I connected with Karla [name] on 05/22/21 at 12:52 PM by telephone and verified that I am speaking with the correct person using two identifiers. Karla [name] is currently located at home and nobody is currently with her during this visit. The provider, Loman Brooklyn, FNP is located in their office at time of visit. ? ?I discussed the limitations, risks, security and privacy concerns of performing an evaluation and management service by telephone and the availability of in person appointments. I also discussed with the patient that there may be a patient responsible charge related to this service. The patient expressed understanding and agreed to proceed. ? ?Subjective: ?PCP: Janora Norlander, DO ? ?Chief Complaint  ?Patient presents with  ? Urinary Tract Infection  ? ?Patient complains of pain in the lower abdomen and vomiting. She has had symptoms for 2 days. Patient denies fever. Patient does have a history of recurrent UTI.  Patient does not have a history of pyelonephritis. Patient reports + leukocytes on a home UTI test. ? ?She also reports her stool is like little balls despite exercise and drinking lots of water. ? ? ?ROS: Per HPI ? ?Current Outpatient Medications:  ?  amoxicillin-clavulanate (AUGMENTIN) 875-125 MG tablet, Take 1 tablet by mouth 2 (two) times daily., Disp: 14 tablet, Rfl: 0 ?  calcium carbonate 1250 MG capsule, Take 250 mg by mouth daily. , Disp: , Rfl:  ?  Cholecalciferol (VITAMIN D) 2000 UNITS CAPS, Take 1 capsule by mouth daily., Disp: , Rfl:  ?  DEXILANT 60 MG capsule, Take 60 mg by mouth daily., Disp: , Rfl:  ?  famotidine (PEPCID) 40 MG tablet, TAKE 1 TABLET BY MOUTH EVERY DAY, Disp: , Rfl:  ?  levothyroxine (SYNTHROID) 88 MCG tablet, Take 1 tablet (88 mcg total) by mouth daily before breakfast., Disp: 90 tablet, Rfl: 3 ?  RESTASIS 0.05 % ophthalmic emulsion, , Disp: , Rfl: 3 ?  rosuvastatin (CRESTOR) 5 MG tablet, TAKE 1/2 TABLET ON MONDAYS,  WEDNESDAYS, AND FRIDAYS., Disp: 18 tablet, Rfl: 3 ? ?Current Facility-Administered Medications:  ?  cyanocobalamin ((VITAMIN B-12)) injection 1,000 mcg, 1,000 mcg, Intramuscular, Q30 days, Ronnie Doss M, DO, 1,000 mcg at 05/06/21 1030 ? ?No Known Allergies ?Past Medical History:  ?Diagnosis Date  ? GERD (gastroesophageal reflux disease)   ? Thyroid disease   ? ? ?Observations/Objective: ?A&O  ?No respiratory distress or wheezing audible over the phone ?Mood, judgement, and thought processes all WNL ? ?Assessment and Plan: ?1. Acute cystitis with hematuria ?Encouraged adequate hydration. ?- cefdinir (OMNICEF) 300 MG capsule; Take 1 capsule (300 mg total) by mouth 2 (two) times daily for 7 days.  Dispense: 14 capsule; Refill: 0 ? ?2. Lower abdominal pain ?- Urinalysis, Routine w reflex microscopic; Future - Urine dipstick shows positive for RBC's, positive for protein, positive for leukocytes, positive for urobilinogen, and positive for ketones.  Micro exam: 0-5 WBC's per HPF, 0-2 RBC's per HPF, and few bacteria. ?- Urine Culture; Future ? ?3. Constipation, unspecified constipation type ?Miralax 1 capful in 6-8 oz beverage of choice once daily as needed.  ? ? ?Follow Up Instructions: ? ?I discussed the assessment and treatment plan with the patient. The patient was provided an opportunity to ask questions and all were answered. The patient agreed with the plan and demonstrated an understanding of the instructions. ?  ?The patient was advised to call back or seek an in-person evaluation if the symptoms worsen or if the condition  fails to improve as anticipated. ? ?The above assessment and management plan was discussed with the patient. The patient verbalized understanding of and has agreed to the management plan. Patient is aware to call the clinic if symptoms persist or worsen. Patient is aware when to return to the clinic for a follow-up visit. Patient educated on when it is appropriate to go to the emergency  department.  ? ?Time call ended: 1:03 PM ? ?I provided 11 minutes of non-face-to-face time during this encounter. ? ?Hendricks Limes, MSN, APRN, FNP-C ?[place] ?05/22/21 ? ? ?

## 2021-05-26 ENCOUNTER — Encounter: Payer: Self-pay | Admitting: Family Medicine

## 2021-05-26 DIAGNOSIS — R311 Benign essential microscopic hematuria: Secondary | ICD-10-CM

## 2021-05-26 DIAGNOSIS — Z87442 Personal history of urinary calculi: Secondary | ICD-10-CM

## 2021-05-26 DIAGNOSIS — N3001 Acute cystitis with hematuria: Secondary | ICD-10-CM

## 2021-05-26 DIAGNOSIS — R1030 Lower abdominal pain, unspecified: Secondary | ICD-10-CM

## 2021-05-26 LAB — URINE CULTURE: Organism ID, Bacteria: NO GROWTH

## 2021-05-29 NOTE — Addendum Note (Signed)
Addended by: Deliah Boston F on: 05/29/2021 12:11 PM ? ? Modules accepted: Orders ? ?

## 2021-05-30 ENCOUNTER — Other Ambulatory Visit: Payer: Medicare Other

## 2021-05-30 DIAGNOSIS — N3001 Acute cystitis with hematuria: Secondary | ICD-10-CM

## 2021-05-30 LAB — URINALYSIS, ROUTINE W REFLEX MICROSCOPIC
Bilirubin, UA: NEGATIVE
Glucose, UA: NEGATIVE
Ketones, UA: NEGATIVE
Leukocytes,UA: NEGATIVE
Nitrite, UA: NEGATIVE
Protein,UA: NEGATIVE
RBC, UA: NEGATIVE
Specific Gravity, UA: 1.01 (ref 1.005–1.030)
Urobilinogen, Ur: 0.2 mg/dL (ref 0.2–1.0)
pH, UA: 6 (ref 5.0–7.5)

## 2021-06-03 ENCOUNTER — Ambulatory Visit (INDEPENDENT_AMBULATORY_CARE_PROVIDER_SITE_OTHER): Payer: Medicare Other | Admitting: *Deleted

## 2021-06-03 DIAGNOSIS — E538 Deficiency of other specified B group vitamins: Secondary | ICD-10-CM

## 2021-06-03 MED ORDER — CYANOCOBALAMIN 1000 MCG/ML IJ SOLN
1000.0000 ug | Freq: Once | INTRAMUSCULAR | Status: AC
  Administered 2021-06-03: 1000 ug via INTRAMUSCULAR

## 2021-06-03 NOTE — Progress Notes (Signed)
Pt given b12 inj L-deltoid. Pt tol well ?

## 2021-06-06 ENCOUNTER — Other Ambulatory Visit: Payer: Self-pay

## 2021-06-06 ENCOUNTER — Ambulatory Visit (HOSPITAL_BASED_OUTPATIENT_CLINIC_OR_DEPARTMENT_OTHER)
Admission: RE | Admit: 2021-06-06 | Discharge: 2021-06-06 | Disposition: A | Discharge status: Home / Self Care | Payer: Medicare Other | Source: Ambulatory Visit | Attending: Family Medicine | Admitting: Family Medicine

## 2021-06-06 ENCOUNTER — Encounter (HOSPITAL_BASED_OUTPATIENT_CLINIC_OR_DEPARTMENT_OTHER): Payer: Self-pay

## 2021-06-06 DIAGNOSIS — R1030 Lower abdominal pain, unspecified: Secondary | ICD-10-CM | POA: Diagnosis not present

## 2021-06-06 DIAGNOSIS — R311 Benign essential microscopic hematuria: Secondary | ICD-10-CM | POA: Insufficient documentation

## 2021-06-06 DIAGNOSIS — Z87442 Personal history of urinary calculi: Secondary | ICD-10-CM | POA: Diagnosis present

## 2021-06-07 ENCOUNTER — Ambulatory Visit
Admission: RE | Admit: 2021-06-07 | Discharge: 2021-06-07 | Disposition: A | Discharge status: Home / Self Care | Payer: Medicare Other | Source: Ambulatory Visit | Attending: Gynecology | Admitting: Gynecology

## 2021-06-07 DIAGNOSIS — Z1231 Encounter for screening mammogram for malignant neoplasm of breast: Secondary | ICD-10-CM

## 2021-07-04 ENCOUNTER — Ambulatory Visit (INDEPENDENT_AMBULATORY_CARE_PROVIDER_SITE_OTHER): Payer: Medicare Other | Admitting: *Deleted

## 2021-07-04 DIAGNOSIS — E538 Deficiency of other specified B group vitamins: Secondary | ICD-10-CM | POA: Diagnosis not present

## 2021-07-04 NOTE — Progress Notes (Signed)
Pt in today for B12 injection in right deltoid, tolerated well. ?

## 2021-07-23 IMAGING — MG DIGITAL SCREENING BILAT W/ TOMO W/ CAD
8 series · 9 of 24 positions shown · non-contrast
Comparison: Previous exam(s).

CLINICAL DATA: Screening.

EXAM:
DIGITAL SCREENING BILATERAL MAMMOGRAM WITH TOMO AND CAD

[R CC synth-2D]
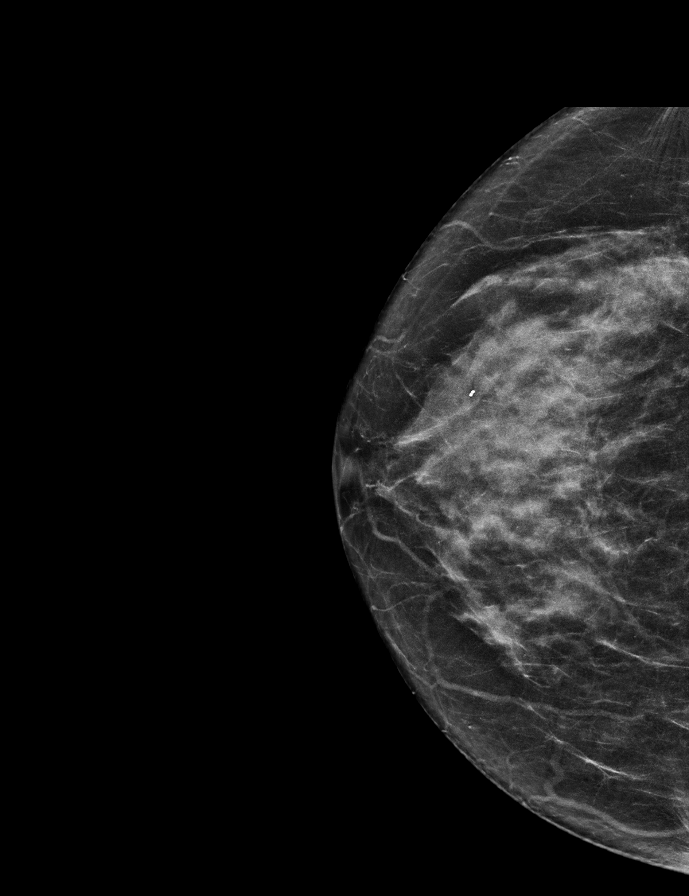

[L MLO synth-2D]
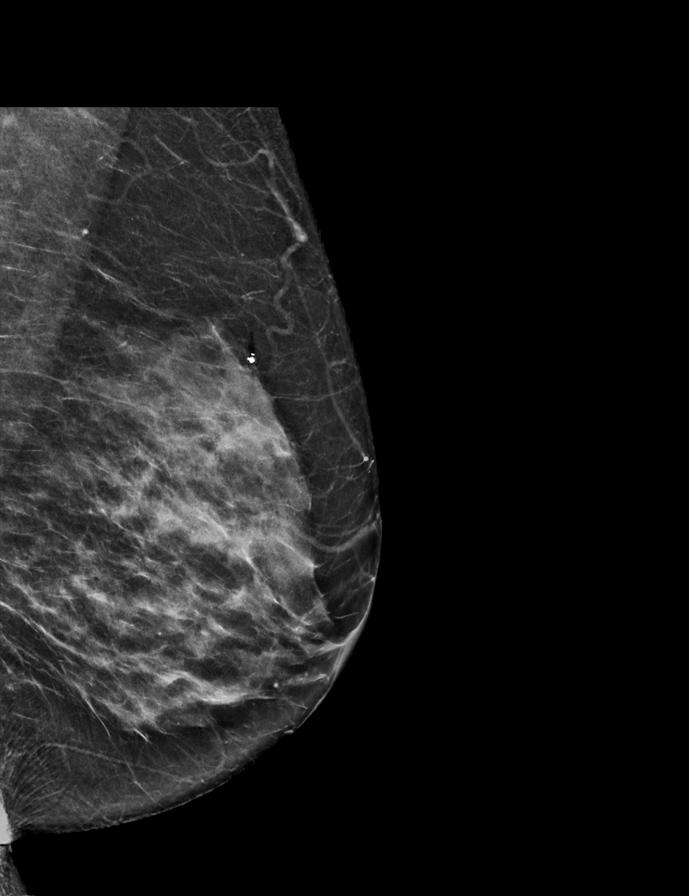

[R MLO synth-2D]
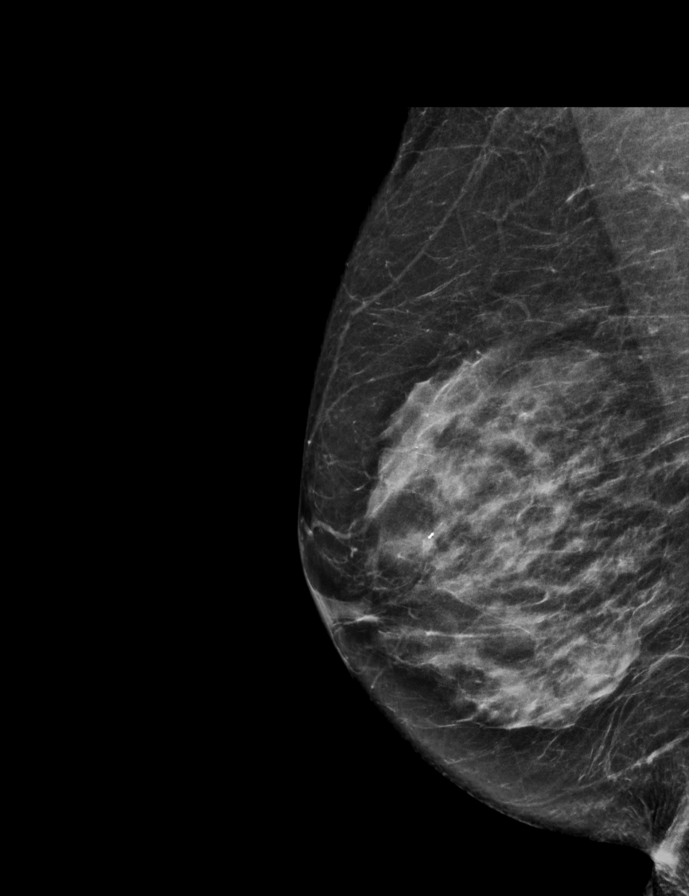

[L CC synth-2D]
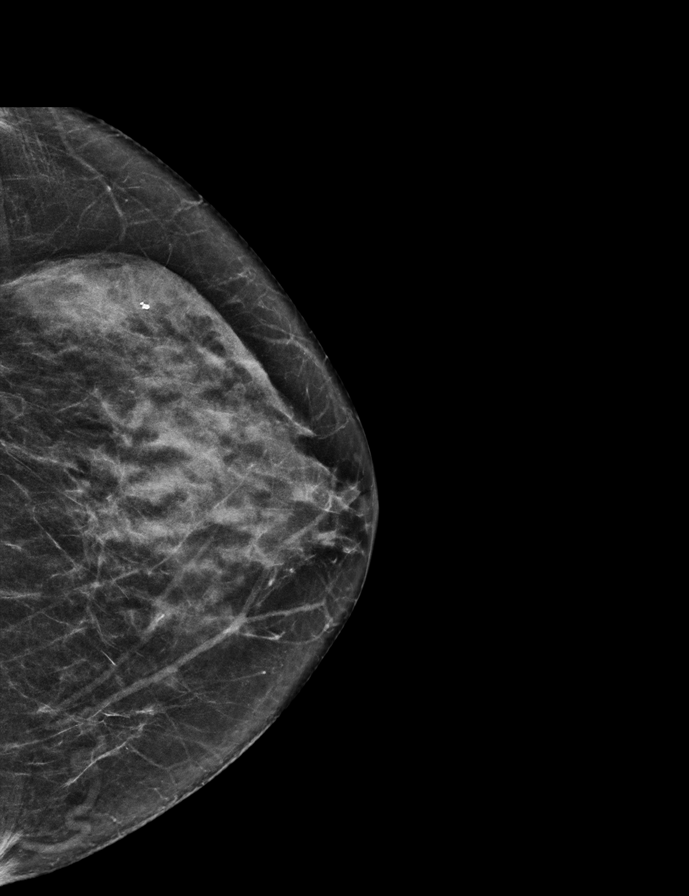

[L MLO tomo · 2 of 60 frames shown]
[frame 20/60]
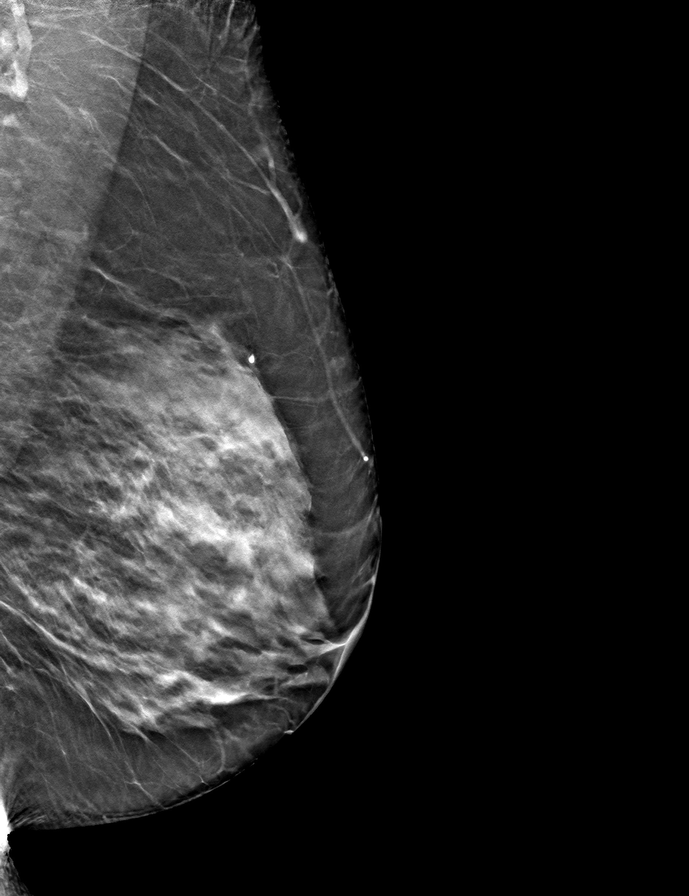
[frame 31/60]
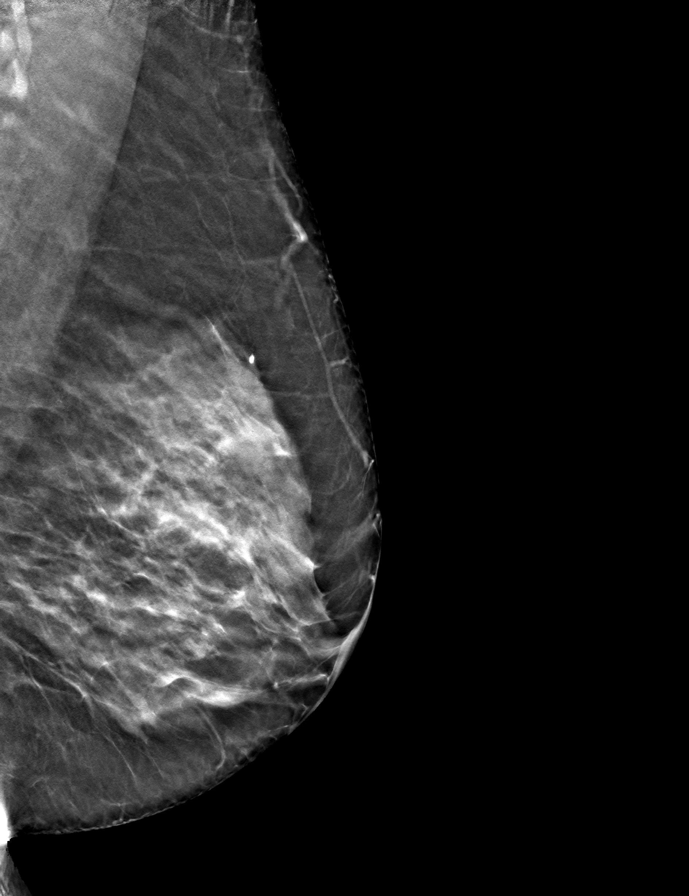

[R CC tomo · tomo slice 31/61.0]
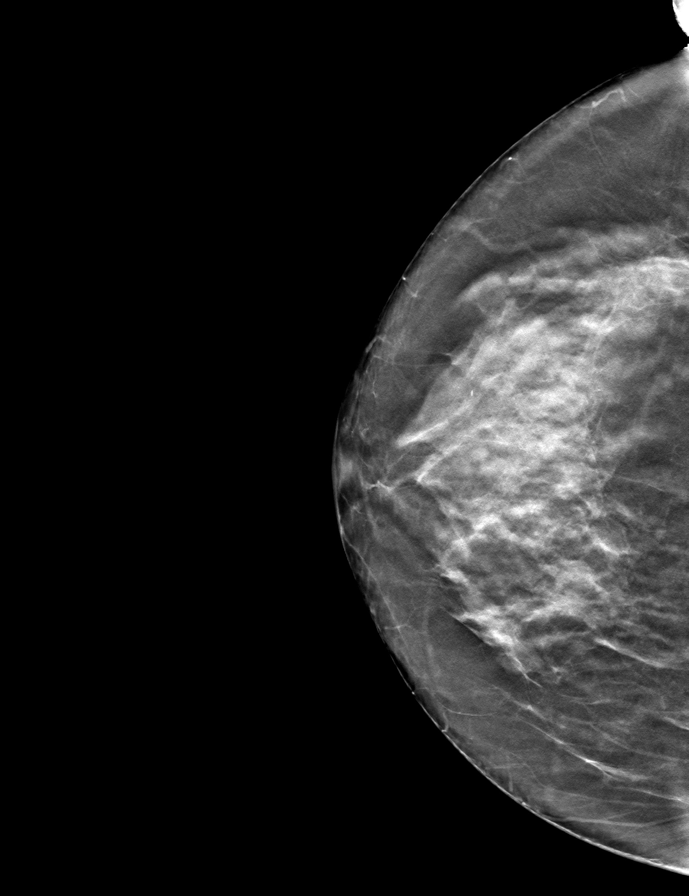

[R MLO tomo · tomo slice 31/62.0]
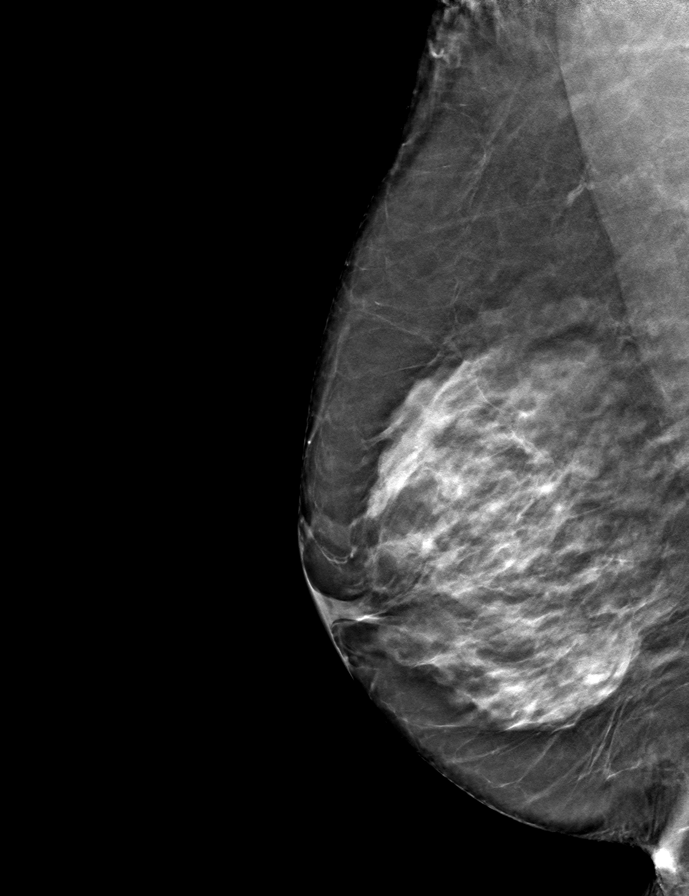

[L CC tomo · tomo slice 29/58.0]
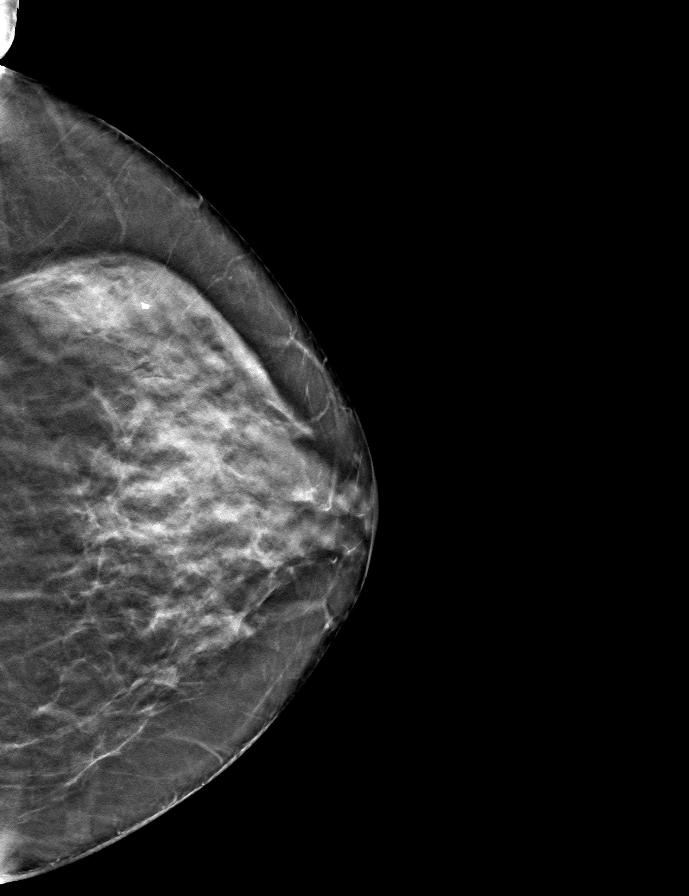

[9 of 24 positions shown; findings below may reference images not displayed]

ACR Breast Density Category c: The breast tissue is heterogeneously
dense, which may obscure small masses.
FINDINGS: There are no findings suspicious for malignancy. Images were
processed with CAD.
IMPRESSION: No mammographic evidence of malignancy. A result letter of this
screening mammogram will be mailed directly to the patient.

RECOMMENDATION:
Screening mammogram in one year. (Code:[id number])

BI-RADS CATEGORY  1: Negative.

## 2021-08-05 ENCOUNTER — Ambulatory Visit (INDEPENDENT_AMBULATORY_CARE_PROVIDER_SITE_OTHER): Payer: Medicare Other | Admitting: Emergency Medicine

## 2021-08-05 DIAGNOSIS — E538 Deficiency of other specified B group vitamins: Secondary | ICD-10-CM | POA: Diagnosis not present

## 2021-08-21 ENCOUNTER — Telehealth: Payer: Self-pay | Admitting: Family Medicine

## 2021-08-21 NOTE — Telephone Encounter (Signed)
Yes I can see future labs from December.  Left patient detailed message about her coming in for fasting lab work.

## 2021-08-21 NOTE — Telephone Encounter (Signed)
What orders would you like.  

## 2021-08-21 NOTE — Telephone Encounter (Signed)
Labs were already preordered 02/2021.  Are you able to see those or do I need to reorder?  She should come in fasting for those

## 2021-08-22 ENCOUNTER — Other Ambulatory Visit: Payer: Self-pay | Admitting: Family Medicine

## 2021-08-22 DIAGNOSIS — E034 Atrophy of thyroid (acquired): Secondary | ICD-10-CM

## 2021-08-26 ENCOUNTER — Ambulatory Visit: Payer: Medicare Other | Admitting: Family Medicine

## 2021-08-27 ENCOUNTER — Other Ambulatory Visit: Payer: Medicare Other

## 2021-08-27 DIAGNOSIS — E7800 Pure hypercholesterolemia, unspecified: Secondary | ICD-10-CM

## 2021-08-27 DIAGNOSIS — E538 Deficiency of other specified B group vitamins: Secondary | ICD-10-CM

## 2021-08-27 DIAGNOSIS — E034 Atrophy of thyroid (acquired): Secondary | ICD-10-CM

## 2021-08-27 DIAGNOSIS — I700 Atherosclerosis of aorta: Secondary | ICD-10-CM

## 2021-08-27 DIAGNOSIS — M810 Age-related osteoporosis without current pathological fracture: Secondary | ICD-10-CM

## 2021-08-28 LAB — CMP14+EGFR
ALT: 14 IU/L (ref 0–32)
AST: 22 IU/L (ref 0–40)
Albumin/Globulin Ratio: 1.7 (ref 1.2–2.2)
Albumin: 4.5 g/dL (ref 3.8–4.8)
Alkaline Phosphatase: 63 IU/L (ref 44–121)
BUN/Creatinine Ratio: 18 (ref 12–28)
BUN: 13 mg/dL (ref 8–27)
Bilirubin Total: 1.3 mg/dL — ABNORMAL HIGH (ref 0.0–1.2)
CO2: 25 mmol/L (ref 20–29)
Calcium: 9.3 mg/dL (ref 8.7–10.3)
Chloride: 105 mmol/L (ref 96–106)
Creatinine, Ser: 0.74 mg/dL (ref 0.57–1.00)
Globulin, Total: 2.6 g/dL (ref 1.5–4.5)
Glucose: 74 mg/dL (ref 70–99)
Potassium: 4.7 mmol/L (ref 3.5–5.2)
Sodium: 143 mmol/L (ref 134–144)
Total Protein: 7.1 g/dL (ref 6.0–8.5)
eGFR: 88 mL/min/{1.73_m2} (ref >59)

## 2021-08-28 LAB — LIPID PANEL
Chol/HDL Ratio: 2.8 ratio (ref 0.0–4.4)
Cholesterol, Total: 148 mg/dL (ref 100–199)
HDL: 53 mg/dL (ref >39)
LDL Chol Calc (NIH): 82 mg/dL (ref 0–99)
Triglycerides: 62 mg/dL (ref 0–149)
VLDL Cholesterol Cal: 13 mg/dL (ref 5–40)

## 2021-08-28 LAB — VITAMIN D 25 HYDROXY (VIT D DEFICIENCY, FRACTURES): Vit D, 25-Hydroxy: 66.2 ng/mL (ref 30.0–100.0)

## 2021-08-28 LAB — T4, FREE: Free T4: 1.64 ng/dL (ref 0.82–1.77)

## 2021-08-28 LAB — TSH: TSH: 0.786 u[IU]/mL (ref 0.450–4.500)

## 2021-08-28 LAB — VITAMIN B12: Vitamin B-12: 1090 pg/mL (ref 232–1245)

## 2021-08-30 ENCOUNTER — Encounter: Payer: Self-pay | Admitting: Family Medicine

## 2021-08-30 ENCOUNTER — Ambulatory Visit (INDEPENDENT_AMBULATORY_CARE_PROVIDER_SITE_OTHER): Payer: Medicare Other | Admitting: Family Medicine

## 2021-08-30 VITALS — BP 140/72 | HR 73 | Temp 97.3°F | Ht 62.0 in | Wt 125.2 lb

## 2021-08-30 DIAGNOSIS — E7800 Pure hypercholesterolemia, unspecified: Secondary | ICD-10-CM | POA: Diagnosis not present

## 2021-08-30 DIAGNOSIS — E034 Atrophy of thyroid (acquired): Secondary | ICD-10-CM | POA: Diagnosis not present

## 2021-08-30 DIAGNOSIS — M810 Age-related osteoporosis without current pathological fracture: Secondary | ICD-10-CM | POA: Diagnosis not present

## 2021-08-30 DIAGNOSIS — J301 Allergic rhinitis due to pollen: Secondary | ICD-10-CM | POA: Diagnosis not present

## 2021-08-30 MED ORDER — TRIAMCINOLONE ACETONIDE 55 MCG/ACT NA AERO: 2.0000 | INHALATION_SPRAY | Freq: Every day | NASAL | 12 refills | Status: DC

## 2021-08-30 NOTE — Progress Notes (Signed)
Subjective: CC: Nasal congestion PCP: Janora Norlander, DO ESL:PNPYYF Karla [name] is a 71 y.o. female presenting to clinic today for:  1.  Nasal congestion Patient reports that this happens every year.  She feels like she gets dry sinuses and has quite a bit of nasal congestion.  She is utilize nasal saline spray but this does not really seem to help the nasal congestion even though it helps the dryness.  Sometimes she will get even some dental pain.  No purulence from nares.  No dizziness reported.  No drainage reported.  Not on any oral antihistamines or nasal sprays though she did use Nasacort many years ago.  She feels congested pretty much daily.  2.  Elevation in blood pressure Patient with elevation in blood pressures noted on multiple occasions here in the office but her blood pressures at home are running systolics 110-211 over 17B to 70s.  She brings in her monitor today and it is pretty reliable as it is consistent with what we are getting.  No chest pain, shortness of breath, dizziness reported.  3.  Hypothyroidism Patient reports no tremor, heart palpitations or changes in bowel habits.  Compliant with Synthroid 88 mcg daily   ROS: Per HPI  No Known Allergies Past Medical History:  Diagnosis Date   GERD (gastroesophageal reflux disease)    Thyroid disease     Current Outpatient Medications:    calcium carbonate 1250 MG capsule, Take 250 mg by mouth daily. , Disp: , Rfl:    Cholecalciferol (VITAMIN D) 2000 UNITS CAPS, Take 1 capsule by mouth daily., Disp: , Rfl:    DEXILANT 60 MG capsule, Take 60 mg by mouth daily., Disp: , Rfl:    estradiol (ESTRACE) 1 MG tablet, Take 1 mg by mouth daily., Disp: , Rfl:    famotidine (PEPCID) 40 MG tablet, TAKE 1 TABLET BY MOUTH EVERY DAY, Disp: , Rfl:    magnesium citrate solution, Take 296 mLs by mouth once., Disp: , Rfl:    RESTASIS 0.05 % ophthalmic emulsion, , Disp: , Rfl: 3   rosuvastatin (CRESTOR) 5 MG tablet, TAKE 1/2 TABLET ON  MONDAYS, WEDNESDAYS, AND FRIDAYS., Disp: 18 tablet, Rfl: 3   SYNTHROID 88 MCG tablet, TAKE 1 TABLET BY MOUTH DAILY BEFORE BREAKFAST., Disp: 30 tablet, Rfl: 0  Current Facility-Administered Medications:    cyanocobalamin ((VITAMIN B-12)) injection 1,000 mcg, 1,000 mcg, Intramuscular, Q30 days, Embrie Mikkelsen M, DO, 1,000 mcg at 08/05/21 1042 Social History   Socioeconomic History   Marital status: Married    Spouse name: Associate Professor    Number of children: 2   Years of education: 14   Highest education level: Associate degree: academic program  Occupational History   Occupation: Retired  Tobacco Use   Smoking status: Never   Smokeless tobacco: Never  Scientific laboratory technician Use: Never used  Substance and Sexual Activity   Alcohol use: No   Drug use: No   Sexual activity: Yes  Other Topics Concern   Not on file  Social History Narrative   Lives at home with husband. Children in [place] and [place].   Social Determinants of Health   Financial Resource Strain: Low Risk  (11/21/2020)   Overall Financial Resource Strain (CARDIA)    Difficulty of Paying Living Expenses: Not hard at all  Food Insecurity: No Food Insecurity (11/21/2020)   Hunger Vital Sign    Worried About Running Out of Food in the Last Year: Never true    Ran  Out of Food in the Last Year: Never true  Transportation Needs: No Transportation Needs (11/21/2020)   PRAPARE - Hydrologist (Medical): No    Lack of Transportation (Non-Medical): No  Physical Activity: Sufficiently Active (11/21/2020)   Exercise Vital Sign    Days of Exercise per Week: 7 days    Minutes of Exercise per Session: 60 min  Stress: No Stress Concern Present (11/21/2020)   [place]    Feeling of Stress : Not at all  Social Connections: [place] (11/21/2020)   Social Connection and Isolation Panel [NHANES]    Frequency of Communication with  Friends and Family: More than three times a week    Frequency of Social Gatherings with Friends and Family: More than three times a week    Attends Religious Services: More than 4 times per year    Active Member of [employer] or Organizations: Yes    Attends Music therapist: More than 4 times per year    Marital Status: Married  Human resources officer Violence: Not At Risk (11/21/2020)   Humiliation, Afraid, Rape, and Kick questionnaire    Fear of Current or Ex-Partner: No    Emotionally Abused: No    Physically Abused: No    Sexually Abused: No   Family History  Problem Relation Age of Onset   Heart disease Mother    Kidney disease Mother    Hypertension Mother    Hyperlipidemia Mother    Hypertension Father     Objective: Office vital signs reviewed. BP 140/72   Pulse 73   Temp (!) 97.3 F (36.3 C)   Ht '5\' 2"'  (1.575 m)   Wt 125 lb 3.2 oz (56.8 kg)   SpO2 100%   BMI 22.90 kg/m   Physical Examination:  General: Awake, alert, well nourished, No acute distress HEENT: Normal    Neck: No masses palpated. No lymphadenopathy    Ears: Tympanic membranes intact, normal light reflex, no erythema, no bulging    Eyes: extraocular membranes intact, sclera white    Nose: nasal turbinates moist, clear nasal discharge    Throat: moist mucus membranes, no erythema, no tonsillar exudate.  Airway is patent Cardio: regular rate and rhythm, S1S2 heard, no murmurs appreciated Pulm: clear to auscultation bilaterally, no wheezes, rhonchi or rales; normal work of breathing on room air Neuro: no tremor  Assessment/ Plan: 71 y.o. female   Seasonal allergic rhinitis due to pollen - Plan: triamcinolone (NASACORT) 55 MCG/ACT AERO nasal inhaler, CBC  Age-related osteoporosis without current pathological fracture - Plan: CMP14+EGFR  Hypothyroidism due to acquired atrophy of thyroid - Plan: TSH, T4, free  Pure hypercholesterolemia - Plan: CMP14+EGFR, Lipid panel  Nasacort sent.  I have  preordered her fasting labs for next visit, which will be her annual physical.  We reviewed that her labs were normal and no changes would be made today.  Continue thyroid med, statin. Blood pressure well controlled at home and I believe that her meter to be reliable.  No antihypertensive medications are needed at this time  No orders of the defined types were placed in this encounter.  No orders of the defined types were placed in this encounter.    Janora Norlander, DO [place] [phone]

## 2021-08-30 NOTE — Patient Instructions (Signed)
Allergic Rhinitis, Adult ?Allergic rhinitis is a reaction to allergens. Allergens are things that can cause an allergic reaction. This condition affects the lining inside the nose (mucous membrane). ?There are two types of allergic rhinitis: ?Seasonal. This type is also called hay fever. It happens only during some times of the year. ?Perennial. This type can happen at any time of the year. ?This condition cannot be spread from person to person (is not contagious). It can be mild, worse, or very bad. It can develop at any age and may be outgrown. ?What are the causes? ?This condition may be caused by: ?Pollen from grasses, trees, and weeds. ?Dust mites. ?Smoke. ?Mold. ?Car fumes. ?The pee (urine), spit, or dander of pets. Dander is dead skin cells from a pet. ?What increases the risk? ?You are more likely to develop this condition if: ?You have allergies in your family. ?You have problems like allergies in your family. You may have: ?Swelling of parts of your eyes and eyelids. ?Asthma. This affects how you breathe. ?Long-term redness and swelling on your skin. ?Food allergies. ?What are the signs or symptoms? ?The main symptom of this condition is a runny or stuffy nose (nasal congestion). Other symptoms may include: ?Sneezing or coughing. ?Itching and tearing of your eyes. ?Mucus that drips down the back of your throat (postnasal drip). ?Trouble sleeping. ?Feeling tired. ?Headache. ?Sore throat. ?How is this treated? ?There is no cure for this condition. You should avoid things that you are allergic to. Treatment can help to relieve symptoms. This may include: ?Medicines that block allergy symptoms, such as corticosteroids or antihistamines. These may be given as a shot, nasal spray, or pill. ?Avoiding things you are allergic to. ?Medicines that give you bits of what you are allergic to over time. This is called immunotherapy. It is done if other treatments do not help. You may get: ?Shots. ?Medicine under your  tongue. ?Stronger medicines, if other treatments do not help. ?Follow these instructions at home: ?Avoiding allergens ?Find out what things you are allergic to and avoid them. To do this, try these things: ?If you get allergies any time of year: ?Replace carpet with wood, tile, or vinyl flooring. Carpet can trap pet dander and dust. ?Do not smoke. Do not allow smoking in your home. ?Change your heating and air conditioning filters at least once a month. ?If you get allergies only some times of the year: ?Keep windows closed when you can. ?Plan things to do outside when pollen counts are lowest. Check pollen counts before you plan things to do outside. ?When you come indoors, change your clothes and shower before you sit on furniture or bedding. ?If you are allergic to a pet: ?Keep the pet out of your bedroom. ?Vacuum, sweep, and dust often. ? ?General instructions ?Take over-the-counter and prescription medicines only as told by your doctor. ?Drink enough fluid to keep your pee (urine) pale yellow. ?Keep all follow-up visits as told by your doctor. This is important. ?Where to find more information ?American Academy of Allergy, Asthma & Immunology: www.aaaai.org ?Contact a doctor if: ?You have a fever. ?You get a cough that does not go away. ?You make whistling sounds when you breathe (wheeze). ?Your symptoms slow you down. ?Your symptoms stop you from doing your normal things each day. ?Get help right away if: ?You are short of breath. ?This symptom may be an emergency. Do not wait to see if the symptom will go away. Get medical help right away. Call your local   emergency services (911 in the [place] Do not drive yourself to the hospital. ?Summary ?Allergic rhinitis may be treated by taking medicines and avoiding things you are allergic to. ?If you have allergies only some of the year, keep windows closed when you can at those times. ?Contact your doctor if you get a fever or a cough that does not go away. ?This  information is not intended to replace advice given to you by your health care provider. Make sure you discuss any questions you have with your health care provider. ?Document Revised: 05/02/2019 Document Reviewed: 03/08/2019 ?Elsevier Patient Education ? 2023 Elsevier Inc. ? ?

## 2021-09-05 ENCOUNTER — Ambulatory Visit (INDEPENDENT_AMBULATORY_CARE_PROVIDER_SITE_OTHER): Payer: Medicare Other

## 2021-09-05 DIAGNOSIS — E538 Deficiency of other specified B group vitamins: Secondary | ICD-10-CM

## 2021-09-13 ENCOUNTER — Other Ambulatory Visit: Payer: Self-pay | Admitting: Emergency Medicine

## 2021-09-13 DIAGNOSIS — F458 Other somatoform disorders: Secondary | ICD-10-CM

## 2021-09-13 DIAGNOSIS — R198 Other specified symptoms and signs involving the digestive system and abdomen: Secondary | ICD-10-CM

## 2021-09-17 ENCOUNTER — Other Ambulatory Visit: Payer: Self-pay | Admitting: Family Medicine

## 2021-09-17 DIAGNOSIS — E034 Atrophy of thyroid (acquired): Secondary | ICD-10-CM

## 2021-09-27 ENCOUNTER — Ambulatory Visit
Admission: RE | Admit: 2021-09-27 | Discharge: 2021-09-27 | Disposition: A | Discharge status: Home / Self Care | Payer: Medicare Other | Source: Ambulatory Visit | Attending: Emergency Medicine | Admitting: Emergency Medicine

## 2021-09-27 DIAGNOSIS — F458 Other somatoform disorders: Secondary | ICD-10-CM

## 2021-09-27 DIAGNOSIS — R198 Other specified symptoms and signs involving the digestive system and abdomen: Secondary | ICD-10-CM

## 2021-10-04 ENCOUNTER — Ambulatory Visit (INDEPENDENT_AMBULATORY_CARE_PROVIDER_SITE_OTHER): Payer: Medicare Other | Admitting: *Deleted

## 2021-10-04 DIAGNOSIS — E538 Deficiency of other specified B group vitamins: Secondary | ICD-10-CM | POA: Diagnosis not present

## 2021-10-04 NOTE — Progress Notes (Signed)
B12 injection, given left deltoid, pt tolerated well.

## 2021-11-05 ENCOUNTER — Ambulatory Visit (INDEPENDENT_AMBULATORY_CARE_PROVIDER_SITE_OTHER): Payer: Medicare Other

## 2021-11-05 DIAGNOSIS — E538 Deficiency of other specified B group vitamins: Secondary | ICD-10-CM

## 2021-11-05 NOTE — Progress Notes (Signed)
Cyanocobalamin injection given to right deltoid.  Patient tolerated well. 

## 2021-11-22 ENCOUNTER — Ambulatory Visit (INDEPENDENT_AMBULATORY_CARE_PROVIDER_SITE_OTHER): Payer: Medicare Other

## 2021-11-22 VITALS — Wt 125.0 lb

## 2021-11-22 DIAGNOSIS — Z0000 Encounter for general adult medical examination without abnormal findings: Secondary | ICD-10-CM

## 2021-11-22 NOTE — Progress Notes (Signed)
Subjective:   Karla [name] is a 71 y.o. female who presents for Medicare Annual (Subsequent) preventive examination.  Virtual Visit via Telephone Note  I connected with  [facility] on 11/22/21 at 10:30 AM EDT by telephone and verified that I am speaking with the correct person using two identifiers.  Location: Patient: Home Provider: [facility] Persons participating in the virtual visit: patient/Nurse Health Advisor   I discussed the limitations, risks, security and privacy concerns of performing an evaluation and management service by telephone and the availability of in person appointments. The patient expressed understanding and agreed to proceed.  Interactive audio and video telecommunications were attempted between this nurse and patient, however failed, due to patient having technical difficulties OR patient did not have access to video capability.  We continued and completed visit with audio only.  Some vital signs may be absent or patient reported.   [name] E Camora Tremain, LPN   Review of Systems     Cardiac Risk Factors include: advanced age (>92men, >95 women)     Objective:    Today's Vitals   11/22/21 1030  Weight: 125 lb (56.7 kg)   Body mass index is 22.86 kg/m.     11/22/2021   10:37 AM 11/21/2020   11:25 AM 11/21/2019   11:26 AM 11/16/2018    1:34 PM  Advanced Directives  Does Patient Have a Medical Advance Directive? No No No No  Would patient like information on creating a medical advance directive? No - Patient declined No - Patient declined No - Patient declined Yes (MAU/Ambulatory/Procedural Areas - Information given)    Current Medications (verified) Outpatient Encounter Medications as of 11/22/2021  Medication Sig   calcium carbonate 1250 MG capsule Take 250 mg by mouth daily.    Cholecalciferol (VITAMIN D) 2000 UNITS CAPS Take 1 capsule by mouth daily.   DEXILANT 60 MG capsule Take 60 mg by mouth daily.   estradiol (ESTRACE) 0.1 MG/GM vaginal cream Place 0.5  g vaginally 2 (two) times a week.   famotidine (PEPCID) 40 MG tablet TAKE 1 TABLET BY MOUTH EVERY DAY   RESTASIS 0.05 % ophthalmic emulsion    rosuvastatin (CRESTOR) 5 MG tablet TAKE 1/2 TABLET ON MONDAYS, WEDNESDAYS, AND FRIDAYS.   SYNTHROID 88 MCG tablet TAKE 1 TABLET BY MOUTH EVERY DAY BEFORE BREAKFAST   triamcinolone (NASACORT) 55 MCG/ACT AERO nasal inhaler Place 2 sprays into the nose daily. Otc if not covered by ins   [DISCONTINUED] estradiol (ESTRACE) 1 MG tablet Take 1 mg by mouth daily.   [DISCONTINUED] magnesium citrate solution Take 296 mLs by mouth once.   Facility-Administered Encounter Medications as of 11/22/2021  Medication   cyanocobalamin ((VITAMIN B-12)) injection 1,000 mcg    Allergies (verified) Patient has no known allergies.   History: Past Medical History:  Diagnosis Date   GERD (gastroesophageal reflux disease)    Thyroid disease    Past Surgical History:  Procedure Laterality Date   CERVICAL SPINE SURGERY     herniated disc   Family History  Problem Relation Age of Onset   Heart disease Mother    Kidney disease Mother    Hypertension Mother    Hyperlipidemia Mother    Hypertension Father    Social History   Socioeconomic History   Marital status: Married    Spouse name: Museum/gallery conservator    Number of children: 2   Years of education: 14   Highest education level: Associate degree: academic program  Occupational History   Occupation: Retired  Tobacco Use   Smoking status: Never   Smokeless tobacco: Never  Vaping Use   Vaping Use: Never used  Substance and Sexual Activity   Alcohol use: No   Drug use: No   Sexual activity: Yes  Other Topics Concern   Not on file  Social History Narrative   Lives at home with husband. Children in [place] and [place].   Social Determinants of Health   Financial Resource Strain: Low Risk  (11/22/2021)   Overall Financial Resource Strain (CARDIA)    Difficulty of Paying Living Expenses: Not hard at all  Food  Insecurity: No Food Insecurity (11/22/2021)   Hunger Vital Sign    Worried About Running Out of Food in the Last Year: Never true    Ran Out of Food in the Last Year: Never true  Transportation Needs: No Transportation Needs (11/22/2021)   PRAPARE - Administrator, Civil Service (Medical): No    Lack of Transportation (Non-Medical): No  Physical Activity: Sufficiently Active (11/22/2021)   Exercise Vital Sign    Days of Exercise per Week: 7 days    Minutes of Exercise per Session: 40 min  Stress: No Stress Concern Present (11/22/2021)   [employer] of Occupational Health - Occupational Stress Questionnaire    Feeling of Stress : Not at all  Social Connections: Socially Integrated (11/22/2021)   Social Connection and Isolation Panel [NHANES]    Frequency of Communication with Friends and Family: More than three times a week    Frequency of Social Gatherings with Friends and Family: More than three times a week    Attends Religious Services: More than 4 times per year    Active Member of [employer] or Organizations: Yes    Attends Engineer, structural: More than 4 times per year    Marital Status: Married    Tobacco Counseling Counseling given: Not Answered   Clinical Intake:  Pre-visit preparation completed: Yes  Pain : No/denies pain     BMI - recorded: 22.86 Nutritional Status: BMI of 19-24  Normal Nutritional Risks: None Diabetes: No  How often do you need to have someone help you when you read instructions, pamphlets, or other written materials from your doctor or pharmacy?: 1 - Never  Diabetic?no  Interpreter Needed?: No  Information entered by :: Cynia Abruzzo, LPN   Activities of Daily Living    11/22/2021   10:38 AM  In your present state of health, do you have any difficulty performing the following activities:  Hearing? 0  Vision? 0  Difficulty concentrating or making decisions? 0  Walking or climbing stairs? 0  Dressing or bathing? 0   Doing errands, shopping? 0  Preparing Food and eating ? N  Using the Toilet? N  In the past six months, have you accidently leaked urine? N  Do you have problems with loss of bowel control? N  Managing your Medications? N  Managing your Finances? N  Housekeeping or managing your Housekeeping? N    Patient Care Team: Raliegh Ip, DO as PCP - General (Family Medicine) Key, Verita Schneiders, NP as Nurse Practitioner (Gynecology) Sharrell Ku, MD as Consulting Physician (Gastroenterology) Nelson Chimes, MD as Attending Physician (Ophthalmology)  Indicate any recent Medical Services you may have received from other than [facility] providers in the past year (date may be approximate).     Assessment:   This is a routine wellness examination for Karla [name].  Hearing/Vision screen Hearing Screening - Comments:: Denies hearing difficulties  Vision Screening - Comments:: Wears rx glasses - up to date with [facility]  Dietary issues and exercise activities discussed: Current Exercise Habits: Home exercise routine, Type of exercise: walking, Time (Minutes): 45, Frequency (Times/Week): 7, Weekly Exercise (Minutes/Week): 315, Intensity: Mild, Exercise limited by: None identified   Goals Addressed             This Visit's Progress    Exercise 3x per week (30 min per time)   On track    Continue to exercise for at least 30 minutes, 3 times weekly.  Stay healthy, stay active with church, family, grandchildren, etc       Depression Screen    11/22/2021   10:36 AM 08/30/2021    8:15 AM 02/28/2021    7:57 AM 11/21/2020   11:19 AM 10/17/2020    3:01 PM 08/29/2020    8:15 AM 02/20/2020    8:06 AM  PHQ 2/9 Scores  PHQ - 2 Score 0 0 0 0 0 0 0  PHQ- 9 Score   0    0    Fall Risk    11/22/2021   10:31 AM 08/30/2021    8:15 AM 02/28/2021    7:58 AM 11/21/2020   11:26 AM 10/17/2020    3:01 PM  Fall Risk   Falls in the past year? 0 1 0 0 0  Number falls in past  yr: 0 0 0 0   Injury with Fall? 0 0 0 0   Risk for fall due to : No Fall Risks History of fall(s) No Fall Risks No Fall Risks   Follow up Falls prevention discussed Education provided Falls prevention discussed Falls prevention discussed     FALL RISK PREVENTION PERTAINING TO THE HOME:  Any stairs in or around the home? Yes  If so, are there any without handrails? No  Home free of loose throw rugs in walkways, pet beds, electrical cords, etc? Yes  Adequate lighting in your home to reduce risk of falls? Yes   ASSISTIVE DEVICES UTILIZED TO PREVENT FALLS:  Life alert? No  Use of a cane, walker or w/c? No  Grab bars in the bathroom? Yes  Shower chair or bench in shower? Yes  Elevated toilet seat or a handicapped toilet? Yes   TIMED UP AND GO:  Was the test performed? No . Telephonic visit  Cognitive Function:        11/22/2021   10:38 AM 11/21/2019   11:28 AM 11/16/2018    1:35 PM  6CIT Screen  What Year? 0 points 0 points 0 points  What month? 0 points 0 points 0 points  What time? 0 points 0 points 0 points  Count back from 20 0 points 0 points 0 points  Months in reverse 0 points 0 points 0 points  Repeat phrase 0 points 0 points 0 points  Total Score 0 points 0 points 0 points    Immunizations Immunization History  Administered Date(s) Administered   Fluad Quad(high Dose 65+) 12/30/2018, 01/04/2020, 01/31/2021   Influenza, High Dose Seasonal PF 01/04/2018   Influenza,inj,Quad PF,6+ Mos 01/04/2014, 12/22/2014, 12/19/2015, 01/08/2017   PFIZER(Purple Top)SARS-COV-2 Vaccination 04/13/2019, 05/02/2019, 01/04/2020   Pneumococcal Conjugate-13 01/04/2014   Pneumococcal Polysaccharide-23 04/08/2018   Tdap 12/01/2013   Zoster, Live 06/05/2014    TDAP status: Up to date  Flu Vaccine status: Up to date  Pneumococcal vaccine status: Up to date  Covid-19 vaccine status: Completed vaccines  Qualifies for Shingles Vaccine? Yes   Zostavax completed Yes   Shingrix  Completed?: No.    Education has been provided regarding the importance of this vaccine. Patient has been advised to call insurance company to determine out of pocket expense if they have not yet received this vaccine. Advised may also receive vaccine at local pharmacy or Health Dept. Verbalized acceptance and understanding.  Screening Tests Health Maintenance  Topic Date Due   COVID-19 Vaccine (4 - Pfizer series) 02/29/2020   INFLUENZA VACCINE  10/22/2021   Zoster Vaccines- Shingrix (1 of 2) 11/30/2021 (Originally 10/21/2002)   MAMMOGRAM  06/08/2022   DEXA SCAN  03/02/2023   TETANUS/TDAP  12/02/2023   COLONOSCOPY (Pts 45-85yrs Insurance coverage will need to be confirmed)  05/05/2027   Pneumonia Vaccine 54+ Years old  Completed   Hepatitis C Screening  Completed   HPV VACCINES  Aged Out    Health Maintenance  Health Maintenance Due  Topic Date Due   COVID-19 Vaccine (4 - Pfizer series) 02/29/2020   INFLUENZA VACCINE  10/22/2021    Colorectal cancer screening: Type of screening: Colonoscopy. Completed 05/04/2017. Repeat every 10 years  Mammogram status: Completed 06/07/2021. Repeat every year  Bone Density status: Completed 03/01/2021. Results reflect: Bone density results: OSTEOPENIA. Repeat every 2 years.  Lung Cancer Screening: (Low Dose CT Chest recommended if Age 41-80 years, 30 pack-year currently smoking OR have quit w/in 15years.) does not qualify.   Additional Screening:  Hepatitis C Screening: does qualify; Completed 01/05/2014  Vision Screening: Recommended annual ophthalmology exams for early detection of glaucoma and other disorders of the eye. Is the patient up to date with their annual eye exam?  Yes  Who is the provider or what is the name of the office in which the patient attends annual eye exams? Diana Eves at [facility] If pt is not established with a provider, would they like to be referred to a provider to establish care? No .   Dental Screening:  Recommended annual dental exams for proper oral hygiene  Community Resource Referral / Chronic Care Management: CRR required this visit?  No   CCM required this visit?  No      Plan:     I have personally reviewed and noted the following in the patient's chart:   Medical and social history Use of alcohol, tobacco or illicit drugs  Current medications and supplements including opioid prescriptions. Patient is not currently taking opioid prescriptions. Functional ability and status Nutritional status Physical activity Advanced directives List of other physicians Hospitalizations, surgeries, and ER visits in previous 12 months Vitals Screenings to include cognitive, depression, and falls Referrals and appointments  In addition, I have reviewed and discussed with patient certain preventive protocols, quality metrics, and best practice recommendations. A written personalized care plan for preventive services as well as general preventive health recommendations were provided to patient.     [place] Constable, LPN   04/01/1476   Nurse Notes: None

## 2021-11-22 NOTE — Patient Instructions (Signed)
Karla [name] , Thank you for taking time to come for your Medicare Wellness Visit. I appreciate your ongoing commitment to your health goals. Please review the following plan we discussed and let me know if I can assist you in the future.   Screening recommendations/referrals: Colonoscopy: Done 05/04/2017 - Repeat in 10 years  Mammogram: done 06/07/2021 - Repeat annually  Bone Density: Done 03/01/2021 - Repeat every 2 years  Recommended yearly ophthalmology/optometry visit for glaucoma screening and checkup Recommended yearly dental visit for hygiene and checkup  Vaccinations: Influenza vaccine: Done 11/22/2021 - Repeat annually  Pneumococcal vaccine: Done  01/04/2014 & 04/08/2018  Tdap vaccine: Done 12/01/2013 - Repeat in 10 years  Shingles vaccine: Zostavax done 2016 - due for Shingrix which is 2 doses 2-6 months apart and over 90% effective     Covid-19: Done  04/13/2019, 05/02/2019, 01/04/2020  Advanced directives: Advance directive discussed with you today. Even though you declined this today, please call our office should you change your mind, and we can give you the proper paperwork for you to fill out.   Conditions/risks identified: Keep up the great work!   Next appointment: Follow up in one year for your annual wellness visit    Preventive Care 65 Years and Older, Female Preventive care refers to lifestyle choices and visits with your health care provider that can promote health and wellness. What does preventive care include? A yearly physical exam. This is also called an annual well check. Dental exams once or twice a year. Routine eye exams. Ask your health care provider how often you should have your eyes checked. Personal lifestyle choices, including: Daily care of your teeth and gums. Regular physical activity. Eating a healthy diet. Avoiding tobacco and drug use. Limiting alcohol use. Practicing safe sex. Taking low-dose aspirin every day. Taking vitamin and mineral  supplements as recommended by your health care provider. What happens during an annual well check? The services and screenings done by your health care provider during your annual well check will depend on your age, overall health, lifestyle risk factors, and family history of disease. Counseling  Your health care provider may ask you questions about your: Alcohol use. Tobacco use. Drug use. Emotional well-being. Home and relationship well-being. Sexual activity. Eating habits. History of falls. Memory and ability to understand (cognition). Work and work Astronomer. Reproductive health. Screening  You may have the following tests or measurements: Height, weight, and BMI. Blood pressure. Lipid and cholesterol levels. These may be checked every 5 years, or more frequently if you are over 47 years old. Skin check. Lung cancer screening. You may have this screening every year starting at age 52 if you have a 30-pack-year history of smoking and currently smoke or have quit within the past 15 years. Fecal occult blood test (FOBT) of the stool. You may have this test every year starting at age 37. Flexible sigmoidoscopy or colonoscopy. You may have a sigmoidoscopy every 5 years or a colonoscopy every 10 years starting at age 54. Hepatitis C blood test. Hepatitis B blood test. Sexually transmitted disease (STD) testing. Diabetes screening. This is done by checking your blood sugar (glucose) after you have not eaten for a while (fasting). You may have this done every 1-3 years. Bone density scan. This is done to screen for osteoporosis. You may have this done starting at age 80. Mammogram. This may be done every 1-2 years. Talk to your health care provider about how often you should have regular mammograms. Talk  with your health care provider about your test results, treatment options, and if necessary, the need for more tests. Vaccines  Your health care provider may recommend certain  vaccines, such as: Influenza vaccine. This is recommended every year. Tetanus, diphtheria, and acellular pertussis (Tdap, Td) vaccine. You may need a Td booster every 10 years. Zoster vaccine. You may need this after age 73. Pneumococcal 13-valent conjugate (PCV13) vaccine. One dose is recommended after age 62. Pneumococcal polysaccharide (PPSV23) vaccine. One dose is recommended after age 5. Talk to your health care provider about which screenings and vaccines you need and how often you need them. This information is not intended to replace advice given to you by your health care provider. Make sure you discuss any questions you have with your health care provider. Document Released: 04/06/2015 Document Revised: 11/28/2015 Document Reviewed: 01/09/2015 Elsevier Interactive Patient Education  2017 [place] Prevention in the Home Falls can cause injuries. They can happen to people of all ages. There are many things you can do to make your home safe and to help prevent falls. What can I do on the outside of my home? Regularly fix the edges of walkways and driveways and fix any cracks. Remove anything that might make you trip as you walk through a door, such as a raised step or threshold. Trim any bushes or trees on the path to your home. Use bright outdoor lighting. Clear any walking paths of anything that might make someone trip, such as rocks or tools. Regularly check to see if handrails are loose or broken. Make sure that both sides of any steps have handrails. Any raised decks and porches should have guardrails on the edges. Have any leaves, snow, or ice cleared regularly. Use sand or salt on walking paths during winter. Clean up any spills in your garage right away. This includes oil or grease spills. What can I do in the bathroom? Use night lights. Install grab bars by the toilet and in the tub and shower. Do not use towel bars as grab bars. Use non-skid mats or decals in  the tub or shower. If you need to sit down in the shower, use a plastic, non-slip stool. Keep the floor dry. Clean up any water that spills on the floor as soon as it happens. Remove soap buildup in the tub or shower regularly. Attach bath mats securely with double-sided non-slip rug tape. Do not have throw rugs and other things on the floor that can make you trip. What can I do in the bedroom? Use night lights. Make sure that you have a light by your bed that is easy to reach. Do not use any sheets or blankets that are too big for your bed. They should not hang down onto the floor. Have a firm chair that has side arms. You can use this for support while you get dressed. Do not have throw rugs and other things on the floor that can make you trip. What can I do in the kitchen? Clean up any spills right away. Avoid walking on wet floors. Keep items that you use a lot in easy-to-reach places. If you need to reach something above you, use a strong step stool that has a grab bar. Keep electrical cords out of the way. Do not use floor polish or wax that makes floors slippery. If you must use wax, use non-skid floor wax. Do not have throw rugs and other things on the floor that can make you  trip. What can I do with my stairs? Do not leave any items on the stairs. Make sure that there are handrails on both sides of the stairs and use them. Fix handrails that are broken or loose. Make sure that handrails are as long as the stairways. Check any carpeting to make sure that it is firmly attached to the stairs. Fix any carpet that is loose or worn. Avoid having throw rugs at the top or bottom of the stairs. If you do have throw rugs, attach them to the floor with carpet tape. Make sure that you have a light switch at the top of the stairs and the bottom of the stairs. If you do not have them, ask someone to add them for you. What else can I do to help prevent falls? Wear shoes that: Do not have high  heels. Have rubber bottoms. Are comfortable and fit you well. Are closed at the toe. Do not wear sandals. If you use a stepladder: Make sure that it is fully opened. Do not climb a closed stepladder. Make sure that both sides of the stepladder are locked into place. Ask someone to hold it for you, if possible. Clearly mark and make sure that you can see: Any grab bars or handrails. First and last steps. Where the edge of each step is. Use tools that help you move around (mobility aids) if they are needed. These include: Canes. Walkers. Scooters. Crutches. Turn on the lights when you go into a dark area. Replace any light bulbs as soon as they burn out. Set up your furniture so you have a clear path. Avoid moving your furniture around. If any of your floors are uneven, fix them. If there are any pets around you, be aware of where they are. Review your medicines with your doctor. Some medicines can make you feel dizzy. This can increase your chance of falling. Ask your doctor what other things that you can do to help prevent falls. This information is not intended to replace advice given to you by your health care provider. Make sure you discuss any questions you have with your health care provider. Document Released: 01/04/2009 Document Revised: 08/16/2015 Document Reviewed: 04/14/2014 Elsevier Interactive Patient Education  2017 [employer].

## 2021-12-06 ENCOUNTER — Ambulatory Visit (INDEPENDENT_AMBULATORY_CARE_PROVIDER_SITE_OTHER): Payer: Medicare Other

## 2021-12-06 DIAGNOSIS — E538 Deficiency of other specified B group vitamins: Secondary | ICD-10-CM

## 2021-12-30 ENCOUNTER — Encounter: Payer: Self-pay | Admitting: Nurse Practitioner

## 2021-12-30 ENCOUNTER — Ambulatory Visit (INDEPENDENT_AMBULATORY_CARE_PROVIDER_SITE_OTHER): Payer: Medicare Other | Admitting: Nurse Practitioner

## 2021-12-30 VITALS — BP 136/68 | HR 76 | Temp 98.6°F | Ht 62.0 in | Wt 125.0 lb

## 2021-12-30 DIAGNOSIS — R300 Dysuria: Secondary | ICD-10-CM

## 2021-12-30 LAB — URINALYSIS, ROUTINE W REFLEX MICROSCOPIC
Bilirubin, UA: NEGATIVE
Glucose, UA: NEGATIVE
Ketones, UA: NEGATIVE
Leukocytes,UA: NEGATIVE
Nitrite, UA: NEGATIVE
Protein,UA: NEGATIVE
RBC, UA: NEGATIVE
Specific Gravity, UA: 1.015 (ref 1.005–1.030)
Urobilinogen, Ur: 0.2 mg/dL (ref 0.2–1.0)
pH, UA: 5.5 (ref 5.0–7.5)

## 2021-12-30 MED ORDER — PHENAZOPYRIDINE HCL 95 MG PO TABS: 95.0000 mg | ORAL_TABLET | Freq: Three times a day (TID) | ORAL | 0 refills | Status: DC | PRN

## 2021-12-30 NOTE — Patient Instructions (Signed)

## 2021-12-30 NOTE — Progress Notes (Signed)
Acute Office Visit  Subjective:     Patient ID: Karla [name], female    DOB: 07-21-1950, 71 y.o.   MRN: [id number]  Chief Complaint  Patient presents with   Dysuria    Pt states this has been going on since 10/04 - states she has pressure some burning - no discharge     Dysuria  This is a new problem. The current episode started yesterday. The problem has been unchanged. The pain is at a severity of 0/10. The patient is experiencing no pain. There has been no fever. Pertinent negatives include no chills, discharge, flank pain, frequency, hematuria, hesitancy, nausea, sweats or urgency. She has tried nothing for the symptoms.    Review of Systems  Constitutional: Negative.  Negative for chills.  HENT: Negative.    Respiratory: Negative.    Cardiovascular: Negative.   Gastrointestinal:  Negative for nausea.  Genitourinary:  Positive for dysuria. Negative for flank pain, frequency, hematuria, hesitancy and urgency.  Skin: Negative.  Negative for itching and rash.  All other systems reviewed and are negative.       Objective:    BP 136/68   Pulse 76   Temp 98.6 F (37 C)   Ht 5\' 2"  (1.575 m)   Wt 125 lb (56.7 kg)   SpO2 100%   BMI 22.86 kg/m  BP Readings from Last 3 Encounters:  12/30/21 136/68  08/30/21 140/72  03/15/21 123/67   Wt Readings from Last 3 Encounters:  12/30/21 125 lb (56.7 kg)  11/22/21 125 lb (56.7 kg)  08/30/21 125 lb 3.2 oz (56.8 kg)      Physical Exam Vitals and nursing note reviewed.  Constitutional:      Appearance: Normal appearance.  HENT:     Head: Normocephalic.     Right Ear: External ear normal.     Left Ear: External ear normal.     Nose: Nose normal.     Mouth/Throat:     Mouth: Mucous membranes are moist.     Pharynx: Oropharynx is clear.  Eyes:     Conjunctiva/sclera: Conjunctivae normal.  Cardiovascular:     Rate and Rhythm: Normal rate and regular rhythm.     Pulses: Normal pulses.     Heart sounds: Normal heart  sounds.  Pulmonary:     Effort: Pulmonary effort is normal.     Breath sounds: Normal breath sounds.  Abdominal:     General: Bowel sounds are normal.     Tenderness: There is no right CVA tenderness or left CVA tenderness.  Skin:    General: Skin is warm.     Findings: No erythema or rash.  Neurological:     General: No focal deficit present.     Mental Status: She is alert and oriented to person, place, and time.     No results found for any visits on 12/30/21.      Assessment & Plan:   UTI  vs  interstitial cystitis -urinalysis completed results pending -pyridium for pain Precaution and education provided -All questions answered -follow up with unresolved symptoms    Problem List Items Addressed This Visit   None Visit Diagnoses     Dysuria    -  Primary   Relevant Medications   phenazopyridine (PYRIDIUM) 95 MG tablet   Other Relevant Orders   Urinalysis, Routine w reflex microscopic       Meds ordered this encounter  Medications   phenazopyridine (PYRIDIUM) 95 MG tablet  Sig: Take 1 tablet (95 mg total) by mouth 3 (three) times daily as needed for pain.    Dispense:  10 tablet    Refill:  0    Order Specific Question:   Supervising Provider    Answer:   Claretta Fraise [phone]    Return if symptoms worsen or fail to improve.  Ivy Lynn, NP

## 2022-01-06 ENCOUNTER — Ambulatory Visit (INDEPENDENT_AMBULATORY_CARE_PROVIDER_SITE_OTHER): Payer: Medicare Other

## 2022-01-06 DIAGNOSIS — E538 Deficiency of other specified B group vitamins: Secondary | ICD-10-CM

## 2022-01-06 NOTE — Progress Notes (Signed)
Cyanocobalamin injection given to right deltoid.  Patient tolerated well. 

## 2022-02-06 ENCOUNTER — Ambulatory Visit (INDEPENDENT_AMBULATORY_CARE_PROVIDER_SITE_OTHER): Payer: Medicare Other | Admitting: *Deleted

## 2022-02-06 DIAGNOSIS — E538 Deficiency of other specified B group vitamins: Secondary | ICD-10-CM

## 2022-02-18 ENCOUNTER — Other Ambulatory Visit: Payer: Medicare Other

## 2022-02-18 DIAGNOSIS — E7800 Pure hypercholesterolemia, unspecified: Secondary | ICD-10-CM

## 2022-02-18 DIAGNOSIS — M810 Age-related osteoporosis without current pathological fracture: Secondary | ICD-10-CM

## 2022-02-18 DIAGNOSIS — E034 Atrophy of thyroid (acquired): Secondary | ICD-10-CM

## 2022-02-18 DIAGNOSIS — J301 Allergic rhinitis due to pollen: Secondary | ICD-10-CM

## 2022-02-19 LAB — CMP14+EGFR
ALT: 16 IU/L (ref 0–32)
AST: 21 IU/L (ref 0–40)
Albumin/Globulin Ratio: 1.7 (ref 1.2–2.2)
Albumin: 4.3 g/dL (ref 3.9–4.9)
Alkaline Phosphatase: 57 IU/L (ref 44–121)
BUN/Creatinine Ratio: 19 (ref 12–28)
BUN: 15 mg/dL (ref 8–27)
Bilirubin Total: 0.5 mg/dL (ref 0.0–1.2)
CO2: 25 mmol/L (ref 20–29)
Calcium: 9.1 mg/dL (ref 8.7–10.3)
Chloride: 102 mmol/L (ref 96–106)
Creatinine, Ser: 0.8 mg/dL (ref 0.57–1.00)
Globulin, Total: 2.6 g/dL (ref 1.5–4.5)
Glucose: 84 mg/dL (ref 70–99)
Potassium: 4.5 mmol/L (ref 3.5–5.2)
Sodium: 143 mmol/L (ref 134–144)
Total Protein: 6.9 g/dL (ref 6.0–8.5)
eGFR: 80 mL/min/{1.73_m2} (ref >59)

## 2022-02-19 LAB — LIPID PANEL
Chol/HDL Ratio: 2.6 ratio (ref 0.0–4.4)
Cholesterol, Total: 148 mg/dL (ref 100–199)
HDL: 58 mg/dL (ref >39)
LDL Chol Calc (NIH): 77 mg/dL (ref 0–99)
Triglycerides: 64 mg/dL (ref 0–149)
VLDL Cholesterol Cal: 13 mg/dL (ref 5–40)

## 2022-02-19 LAB — CBC
Hematocrit: 40.5 % (ref 34.0–46.6)
Hemoglobin: 13.3 g/dL (ref 11.1–15.9)
MCH: 32.5 pg (ref 26.6–33.0)
MCHC: 32.8 g/dL (ref 31.5–35.7)
MCV: 99 fL — ABNORMAL HIGH (ref 79–97)
Platelets: 253 10*3/uL (ref 150–450)
RBC: 4.09 x10E6/uL (ref 3.77–5.28)
RDW: 11.9 % (ref 11.7–15.4)
WBC: 5.9 10*3/uL (ref 3.4–10.8)

## 2022-02-19 LAB — TSH: TSH: 3 u[IU]/mL (ref 0.450–4.500)

## 2022-02-19 LAB — T4, FREE: Free T4: 1.52 ng/dL (ref 0.82–1.77)

## 2022-02-21 ENCOUNTER — Encounter: Payer: Self-pay | Admitting: Family Medicine

## 2022-02-21 ENCOUNTER — Ambulatory Visit (INDEPENDENT_AMBULATORY_CARE_PROVIDER_SITE_OTHER): Payer: Medicare Other | Admitting: Family Medicine

## 2022-02-21 VITALS — BP 125/66 | HR 73 | Temp 97.0°F | Ht 62.0 in | Wt 125.2 lb

## 2022-02-21 DIAGNOSIS — R718 Other abnormality of red blood cells: Secondary | ICD-10-CM

## 2022-02-21 DIAGNOSIS — Z23 Encounter for immunization: Secondary | ICD-10-CM | POA: Diagnosis not present

## 2022-02-21 DIAGNOSIS — M810 Age-related osteoporosis without current pathological fracture: Secondary | ICD-10-CM | POA: Diagnosis not present

## 2022-02-21 DIAGNOSIS — E034 Atrophy of thyroid (acquired): Secondary | ICD-10-CM | POA: Diagnosis not present

## 2022-02-21 DIAGNOSIS — E7800 Pure hypercholesterolemia, unspecified: Secondary | ICD-10-CM

## 2022-02-21 DIAGNOSIS — I700 Atherosclerosis of aorta: Secondary | ICD-10-CM

## 2022-02-21 MED ORDER — ROSUVASTATIN CALCIUM 5 MG PO TABS: ORAL_TABLET | ORAL | 3 refills | Status: DC

## 2022-02-21 NOTE — Progress Notes (Signed)
Karla [name] is a 71 y.o. female presents to office today for annual physical exam examination.    Concerns today include: 1.  None.  She is doing well.  Occupation: Retired  Diet: High-fiber, well-balanced, Exercise: Active Last eye exam: Up-to-date Last dental exam: Up-to-date Last colonoscopy: Up-to-date Last mammogram: Up-to-date Last pap smear: Up-to-date, sees OB/GYN Refills needed today: Statin Immunizations needed: Flu shot Immunization History  Administered Date(s) Administered   Fluad Quad(high Dose 65+) 12/30/2018, 01/04/2020, 01/31/2021   Influenza, High Dose Seasonal PF 01/04/2018   Influenza,inj,Quad PF,6+ Mos 01/04/2014, 12/22/2014, 12/19/2015, 01/08/2017   PFIZER(Purple Top)SARS-COV-2 Vaccination 04/13/2019, 05/02/2019, 01/04/2020   Pneumococcal Conjugate-13 01/04/2014   Pneumococcal Polysaccharide-23 04/08/2018   Tdap 12/01/2013   Zoster, Live 06/05/2014     Past Medical History:  Diagnosis Date   GERD (gastroesophageal reflux disease)    Thyroid disease    Social History   Socioeconomic History   Marital status: Married    Spouse name: Museum/gallery conservator    Number of children: 2   Years of education: 14   Highest education level: Associate degree: academic program  Occupational History   Occupation: Retired  Tobacco Use   Smoking status: Never   Smokeless tobacco: Never  Building services engineer Use: Never used  Substance and Sexual Activity   Alcohol use: No   Drug use: No   Sexual activity: Yes  Other Topics Concern   Not on file  Social History Narrative   Lives at home with husband. Children in [place] and [place].   Social Determinants of Health   Financial Resource Strain: Low Risk  (11/22/2021)   Overall Financial Resource Strain (CARDIA)    Difficulty of Paying Living Expenses: Not hard at all  Food Insecurity: No Food Insecurity (11/22/2021)   Hunger Vital Sign    Worried About Running Out of Food in the Last Year: Never true    Ran Out of Food  in the Last Year: Never true  Transportation Needs: No Transportation Needs (11/22/2021)   PRAPARE - Administrator, Civil Service (Medical): No    Lack of Transportation (Non-Medical): No  Physical Activity: Sufficiently Active (11/22/2021)   Exercise Vital Sign    Days of Exercise per Week: 7 days    Minutes of Exercise per Session: 40 min  Stress: No Stress Concern Present (11/22/2021)   [employer] of Occupational Health - Occupational Stress Questionnaire    Feeling of Stress : Not at all  Social Connections: Socially Integrated (11/22/2021)   Social Connection and Isolation Panel [NHANES]    Frequency of Communication with Friends and Family: More than three times a week    Frequency of Social Gatherings with Friends and Family: More than three times a week    Attends Religious Services: More than 4 times per year    Active Member of [employer] or Organizations: Yes    Attends Engineer, structural: More than 4 times per year    Marital Status: Married  Catering manager Violence: Not At Risk (11/22/2021)   Humiliation, Afraid, Rape, and Kick questionnaire    Fear of Current or Ex-Partner: No    Emotionally Abused: No    Physically Abused: No    Sexually Abused: No   Past Surgical History:  Procedure Laterality Date   CERVICAL SPINE SURGERY     herniated disc   Family History  Problem Relation Age of Onset   Heart disease Mother    Kidney disease Mother  Hypertension Mother    Hyperlipidemia Mother    Hypertension Father     Current Outpatient Medications:    Cholecalciferol (VITAMIN D) 2000 UNITS CAPS, Take 1 capsule by mouth daily., Disp: , Rfl:    DEXILANT 60 MG capsule, Take 60 mg by mouth daily., Disp: , Rfl:    estradiol (ESTRACE) 0.1 MG/GM vaginal cream, Place 0.5 g vaginally 2 (two) times a week., Disp: , Rfl:    famotidine (PEPCID) 40 MG tablet, TAKE 1 TABLET BY MOUTH EVERY DAY, Disp: , Rfl:    RESTASIS 0.05 % ophthalmic emulsion, , Disp: , Rfl:  3   rosuvastatin (CRESTOR) 5 MG tablet, TAKE 1/2 TABLET ON MONDAYS, WEDNESDAYS, AND FRIDAYS., Disp: 18 tablet, Rfl: 3   SYNTHROID 88 MCG tablet, TAKE 1 TABLET BY MOUTH EVERY DAY BEFORE BREAKFAST, Disp: 90 tablet, Rfl: 3   triamcinolone (NASACORT) 55 MCG/ACT AERO nasal inhaler, Place 2 sprays into the nose daily. Otc if not covered by ins, Disp: 1 each, Rfl: 12  Current Facility-Administered Medications:    cyanocobalamin ((VITAMIN B-12)) injection 1,000 mcg, 1,000 mcg, Intramuscular, Q30 days, Keimani Laufer M, DO, 1,000 mcg at 02/06/22 1023  No Known Allergies   ROS: Review of Systems Pertinent items noted in HPI and remainder of comprehensive ROS otherwise negative.    Physical exam BP 125/66   Pulse 73   Temp (!) 97 F (36.1 C)   Ht 5\' 2"  (1.575 m)   Wt 125 lb 3.2 oz (56.8 kg)   SpO2 99%   BMI 22.90 kg/m  General appearance: alert, cooperative, appears stated age, and no distress Head: Normocephalic, without obvious abnormality, atraumatic Eyes: conjunctivae/corneas clear. PERRL, EOM's intact.   Ears: normal TM's and external ear canals both ears Nose: Nares normal. Septum midline. Mucosa normal. No drainage or sinus tenderness. Throat: lips, mucosa, and tongue normal; teeth and gums normal Neck: no adenopathy, supple, symmetrical, trachea midline, and thyroid not enlarged, symmetric, no tenderness/mass/nodules Back: symmetric, no curvature. ROM normal. No CVA tenderness. Lungs: clear to auscultation bilaterally Heart: regular rate and rhythm, S1, S2 normal, no murmur, click, rub or gallop Abdomen: soft, non-tender; bowel sounds normal; no masses,  no organomegaly Extremities: extremities normal, atraumatic, no cyanosis or edema Pulses: 2+ and symmetric Skin: Skin color, texture, turgor normal. No rashes or lesions Lymph nodes: Cervical, supraclavicular, and axillary nodes normal. Neurologic: Grossly normal Flowsheet Row Office Visit from 02/21/2022 in [place]  Family Medicine  PHQ-2 Total Score 0        Assessment/ Plan: [name] here for annual physical exam.   Age-related osteoporosis without current pathological fracture  Need for immunization against influenza - Plan: Flu Vaccine QUAD High Dose(Fluad)  Hypothyroidism due to acquired atrophy of thyroid  Pure hypercholesterolemia - Plan: rosuvastatin (CRESTOR) 5 MG tablet  Thoracic aorta atherosclerosis (HCC)  Elevated MCV  Continue Fosamax.  Not yet due for DEXA scan.  Influenza vaccination administered  Thyroid levels were appropriate.  Continue current regimen  Lipids were appropriate.  Continue low-dose pulsed dosed statin  Discussed incorporation of fiber, vitamins into diet including green leafy vegetables which may help with the slightly elevated MCV noted on laboratory draw.  Her B12 has historically been at the higher end of normal so I doubt this is reflective of a B12 deficiency    Counseled on healthy lifestyle choices, including diet (rich in fruits, vegetables and lean meats and low in salt and simple carbohydrates) and exercise (at least 30 minutes of moderate physical  activity daily).  Patient to follow up in 20m-1 year for annual exam or sooner if needed.  Sebastain Fishbaugh M. Nadine Counts, DO

## 2022-02-21 NOTE — Patient Instructions (Signed)
Preventive Care 65 Years and Older, Female Preventive care refers to lifestyle choices and visits with your health care provider that can promote health and wellness. Preventive care visits are also called wellness exams. What can I expect for my preventive care visit? Counseling Your health care provider may ask you questions about your: Medical history, including: Past medical problems. Family medical history. Pregnancy and menstrual history. History of falls. Current health, including: Memory and ability to understand (cognition). Emotional well-being. Home life and relationship well-being. Sexual activity and sexual health. Lifestyle, including: Alcohol, nicotine or tobacco, and drug use. Access to firearms. Diet, exercise, and sleep habits. Work and work environment. Sunscreen use. Safety issues such as seatbelt and bike helmet use. Physical exam Your health care provider will check your: Height and weight. These may be used to calculate your BMI (body mass index). BMI is a measurement that tells if you are at a healthy weight. Waist circumference. This measures the distance around your waistline. This measurement also tells if you are at a healthy weight and may help predict your risk of certain diseases, such as type 2 diabetes and high blood pressure. Heart rate and blood pressure. Body temperature. Skin for abnormal spots. What immunizations do I need?  Vaccines are usually given at various ages, according to a schedule. Your health care provider will recommend vaccines for you based on your age, medical history, and lifestyle or other factors, such as travel or where you work. What tests do I need? Screening Your health care provider may recommend screening tests for certain conditions. This may include: Lipid and cholesterol levels. Hepatitis C test. Hepatitis B test. HIV (human immunodeficiency virus) test. STI (sexually transmitted infection) testing, if you are at  risk. Lung cancer screening. Colorectal cancer screening. Diabetes screening. This is done by checking your blood sugar (glucose) after you have not eaten for a while (fasting). Mammogram. Talk with your health care provider about how often you should have regular mammograms. BRCA-related cancer screening. This may be done if you have a family history of breast, ovarian, tubal, or peritoneal cancers. Bone density scan. This is done to screen for osteoporosis. Talk with your health care provider about your test results, treatment options, and if necessary, the need for more tests. Follow these instructions at home: Eating and drinking  Eat a diet that includes fresh fruits and vegetables, whole grains, lean protein, and low-fat dairy products. Limit your intake of foods with high amounts of sugar, saturated fats, and salt. Take vitamin and mineral supplements as recommended by your health care provider. Do not drink alcohol if your health care provider tells you not to drink. If you drink alcohol: Limit how much you have to 0-1 drink a day. Know how much alcohol is in your drink. In the [place] one drink equals one 12 oz bottle of beer (355 mL), one 5 oz glass of wine (148 mL), or one 1 oz glass of hard liquor (44 mL). Lifestyle Brush your teeth every morning and night with fluoride toothpaste. Floss one time each day. Exercise for at least 30 minutes 5 or more days each week. Do not use any products that contain nicotine or tobacco. These products include cigarettes, chewing tobacco, and vaping devices, such as e-cigarettes. If you need help quitting, ask your health care provider. Do not use drugs. If you are sexually active, practice safe sex. Use a condom or other form of protection in order to prevent STIs. Take aspirin only as told by   your health care provider. Make sure that you understand how much to take and what form to take. Work with your health care provider to find out whether it  is safe and beneficial for you to take aspirin daily. Ask your health care provider if you need to take a cholesterol-lowering medicine (statin). Find healthy ways to manage stress, such as: Meditation, yoga, or listening to music. Journaling. Talking to a trusted person. Spending time with friends and family. Minimize exposure to UV radiation to reduce your risk of skin cancer. Safety Always wear your seat belt while driving or riding in a vehicle. Do not drive: If you have been drinking alcohol. Do not ride with someone who has been drinking. When you are tired or distracted. While texting. If you have been using any mind-altering substances or drugs. Wear a helmet and other protective equipment during sports activities. If you have firearms in your house, make sure you follow all gun safety procedures. What's next? Visit your health care provider once a year for an annual wellness visit. Ask your health care provider how often you should have your eyes and teeth checked. Stay up to date on all vaccines. This information is not intended to replace advice given to you by your health care provider. Make sure you discuss any questions you have with your health care provider. Document Revised: 09/05/2020 Document Reviewed: 09/05/2020 Elsevier Patient Education  2023 Elsevier Inc.  

## 2022-02-26 ENCOUNTER — Ambulatory Visit: Payer: Medicare Other | Admitting: Family Medicine

## 2022-03-06 IMAGING — DX DG CHEST 2V
2 series · 2 of 2 positions shown · non-contrast
Comparison: 02/04/2018.

CLINICAL DATA: Right-sided chest pain.

EXAM:
CHEST - 2 VIEW

[chest pa]
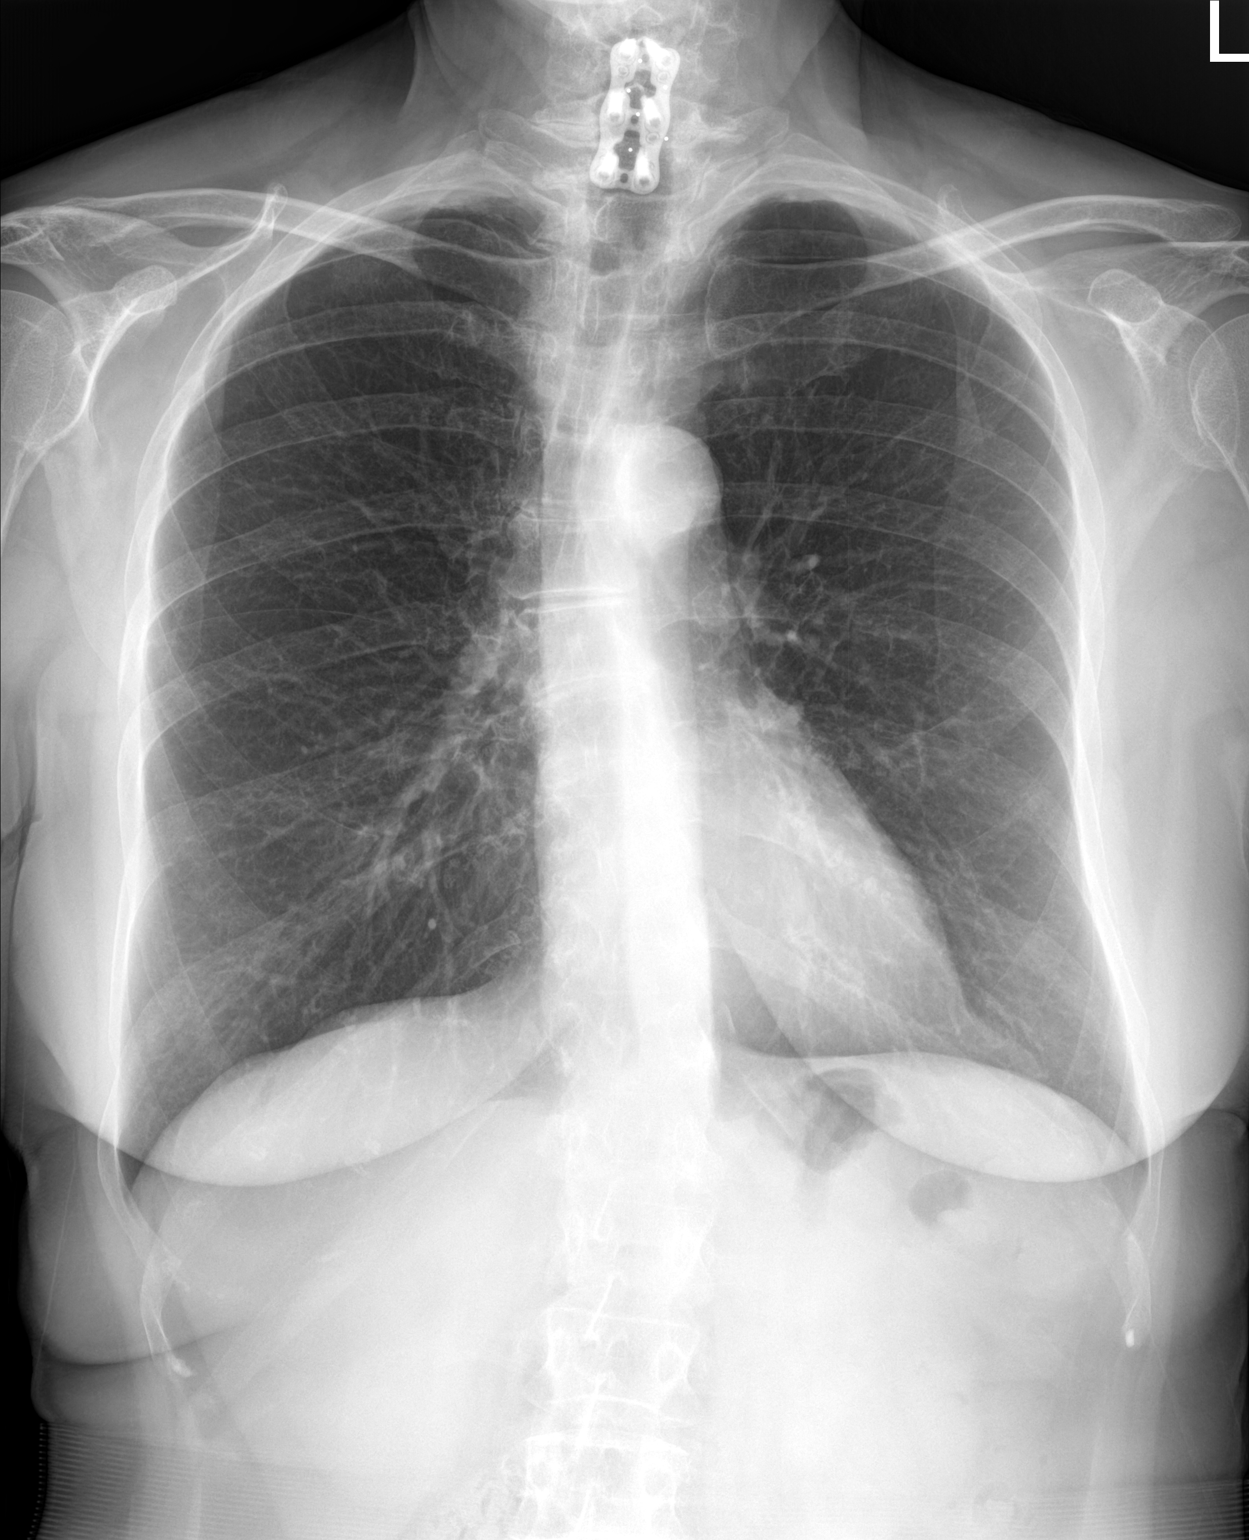

[chest lat]
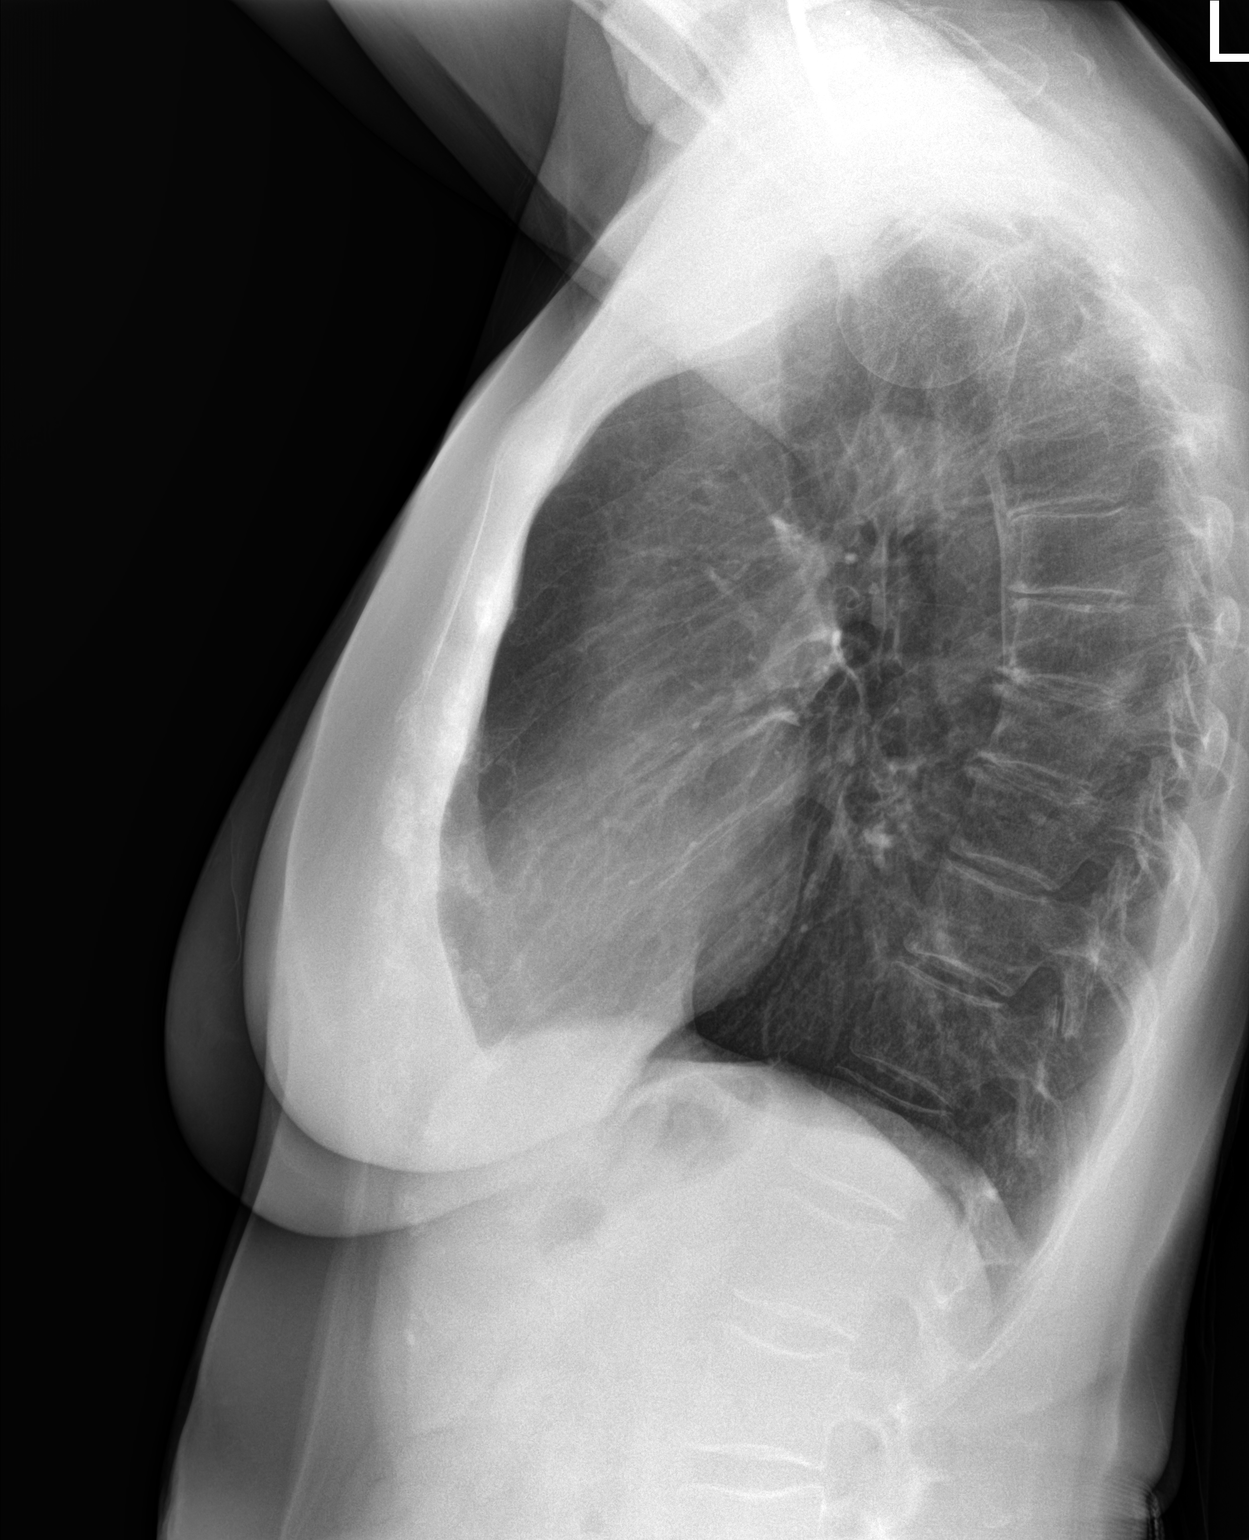

[2 of 2 positions shown; findings below may reference images not displayed]

FINDINGS: Mediastinum hilar structures normal. Lungs are clear. No focal
infiltrate. No pleural effusion or pneumothorax. Heart size normal.
Prior cervical spine fusion. Thoracolumbar spine scoliosis and
degenerative change.
IMPRESSION: No acute cardiopulmonary disease.

## 2022-03-10 ENCOUNTER — Ambulatory Visit (INDEPENDENT_AMBULATORY_CARE_PROVIDER_SITE_OTHER): Payer: Medicare Other

## 2022-03-10 DIAGNOSIS — E538 Deficiency of other specified B group vitamins: Secondary | ICD-10-CM

## 2022-04-10 ENCOUNTER — Ambulatory Visit (INDEPENDENT_AMBULATORY_CARE_PROVIDER_SITE_OTHER): Payer: Medicare Other | Admitting: *Deleted

## 2022-04-10 ENCOUNTER — Other Ambulatory Visit: Payer: Self-pay | Admitting: Gynecology

## 2022-04-10 DIAGNOSIS — E538 Deficiency of other specified B group vitamins: Secondary | ICD-10-CM

## 2022-04-10 DIAGNOSIS — Z1231 Encounter for screening mammogram for malignant neoplasm of breast: Secondary | ICD-10-CM

## 2022-04-10 NOTE — Patient Instructions (Signed)

## 2022-04-10 NOTE — Progress Notes (Signed)
Vitamin b12 injection given and patient tolerated well.  

## 2022-05-12 ENCOUNTER — Ambulatory Visit: Payer: Medicare Other

## 2022-05-13 ENCOUNTER — Ambulatory Visit (INDEPENDENT_AMBULATORY_CARE_PROVIDER_SITE_OTHER): Payer: Medicare Other | Admitting: *Deleted

## 2022-05-13 DIAGNOSIS — E538 Deficiency of other specified B group vitamins: Secondary | ICD-10-CM | POA: Diagnosis not present

## 2022-05-13 NOTE — Progress Notes (Signed)
Vitamin b12 injection given and patient tolerated well.  

## 2022-06-09 ENCOUNTER — Ambulatory Visit
Admission: RE | Admit: 2022-06-09 | Discharge: 2022-06-09 | Disposition: A | Discharge status: Home / Self Care | Payer: Medicare Other | Source: Ambulatory Visit | Attending: Gynecology | Admitting: Gynecology

## 2022-06-09 DIAGNOSIS — Z1231 Encounter for screening mammogram for malignant neoplasm of breast: Secondary | ICD-10-CM

## 2022-06-12 ENCOUNTER — Ambulatory Visit (INDEPENDENT_AMBULATORY_CARE_PROVIDER_SITE_OTHER): Payer: Medicare Other

## 2022-06-12 DIAGNOSIS — E538 Deficiency of other specified B group vitamins: Secondary | ICD-10-CM

## 2022-06-12 NOTE — Progress Notes (Signed)
Cyanocobalamin injection given to left deltoid.  Patient tolerated well. 

## 2022-06-19 DIAGNOSIS — K8020 Calculus of gallbladder without cholecystitis without obstruction: Secondary | ICD-10-CM | POA: Insufficient documentation

## 2022-07-08 ENCOUNTER — Encounter: Payer: Self-pay | Admitting: Family Medicine

## 2022-07-08 ENCOUNTER — Ambulatory Visit (INDEPENDENT_AMBULATORY_CARE_PROVIDER_SITE_OTHER): Payer: Medicare Other | Admitting: Family Medicine

## 2022-07-08 VITALS — BP 142/76 | HR 99 | Ht 62.0 in | Wt 123.0 lb

## 2022-07-08 DIAGNOSIS — Z91038 Other insect allergy status: Secondary | ICD-10-CM | POA: Diagnosis not present

## 2022-07-08 MED ORDER — PREDNISONE 10 MG (21) PO TBPK: ORAL_TABLET | ORAL | 0 refills | Status: DC

## 2022-07-08 MED ORDER — LEVOCETIRIZINE DIHYDROCHLORIDE 5 MG PO TABS: 5.0000 mg | ORAL_TABLET | Freq: Every evening | ORAL | 0 refills | Status: DC | PRN

## 2022-07-08 MED ORDER — TRIAMCINOLONE ACETONIDE 0.1 % EX CREA: 1.0000 | TOPICAL_CREAM | Freq: Two times a day (BID) | CUTANEOUS | 0 refills | Status: DC | PRN

## 2022-07-08 NOTE — Progress Notes (Signed)
Subjective: CC: Skin rash PCP: Raliegh Ip, DO ZOX:WRUEAV Biondolillo is a 72 y.o. female presenting to clinic today for:  1.  Rash Patient reports onset of lesions on Friday.  They initially affected just 1 lower extremity but then she noticed more and more pop up along bilateral lower extremities on along the left forearm.  Symptoms are refractory to CeraVe for eczema.  She describes diffuse itching.  Thinks perhaps these might be flea bites because her granddaughter's dog sometimes sleeps in the bed with her and she was at that house last Friday.   ROS: Per HPI  No Known Allergies Past Medical History:  Diagnosis Date   GERD (gastroesophageal reflux disease)    Thyroid disease     Current Outpatient Medications:    Cholecalciferol (VITAMIN D) 2000 UNITS CAPS, Take 1 capsule by mouth daily., Disp: , Rfl:    DEXILANT 60 MG capsule, Take 60 mg by mouth daily., Disp: , Rfl:    estradiol (ESTRACE) 0.1 MG/GM vaginal cream, Place 0.5 g vaginally 2 (two) times a week., Disp: , Rfl:    famotidine (PEPCID) 40 MG tablet, TAKE 1 TABLET BY MOUTH EVERY DAY, Disp: , Rfl:    RESTASIS 0.05 % ophthalmic emulsion, , Disp: , Rfl: 3   rosuvastatin (CRESTOR) 5 MG tablet, TAKE 1/2 TABLET ON MONDAYS, WEDNESDAYS, AND FRIDAYS., Disp: 18 tablet, Rfl: 3   SYNTHROID 88 MCG tablet, TAKE 1 TABLET BY MOUTH EVERY DAY BEFORE BREAKFAST, Disp: 90 tablet, Rfl: 3   triamcinolone (NASACORT) 55 MCG/ACT AERO nasal inhaler, Place 2 sprays into the nose daily. Otc if not covered by ins, Disp: 1 each, Rfl: 12  Current Facility-Administered Medications:    cyanocobalamin ((VITAMIN B-12)) injection 1,000 mcg, 1,000 mcg, Intramuscular, Q30 days, Coni Homesley M, DO, 1,000 mcg at 06/12/22 0935 Social History   Socioeconomic History   Marital status: Married    Spouse name: Museum/gallery conservator    Number of children: 2   Years of education: 14   Highest education level: Associate degree: academic program  Occupational History    Occupation: Retired  Tobacco Use   Smoking status: Never   Smokeless tobacco: Never  Building services engineer Use: Never used  Substance and Sexual Activity   Alcohol use: No   Drug use: No   Sexual activity: Yes  Other Topics Concern   Not on file  Social History Narrative   Lives at home with husband. Children in [place] and [place].   Social Determinants of Health   Financial Resource Strain: Low Risk  (07/08/2022)   Overall Financial Resource Strain (CARDIA)    Difficulty of Paying Living Expenses: Not hard at all  Food Insecurity: No Food Insecurity (07/08/2022)   Hunger Vital Sign    Worried About Running Out of Food in the Last Year: Never true    Ran Out of Food in the Last Year: Never true  Transportation Needs: No Transportation Needs (07/08/2022)   PRAPARE - Administrator, Civil Service (Medical): No    Lack of Transportation (Non-Medical): No  Physical Activity: Sufficiently Active (07/08/2022)   Exercise Vital Sign    Days of Exercise per Week: 7 days    Minutes of Exercise per Session: 40 min  Stress: No Stress Concern Present (07/08/2022)   [employer] of Occupational Health - Occupational Stress Questionnaire    Feeling of Stress : Not at all  Social Connections: Socially Integrated (07/08/2022)   Social Connection and Isolation Panel [  NHANES]    Frequency of Communication with Friends and Family: More than three times a week    Frequency of Social Gatherings with Friends and Family: Twice a week    Attends Religious Services: More than 4 times per year    Active Member of [employer] or Organizations: Yes    Attends Engineer, structural: More than 4 times per year    Marital Status: Married  Catering manager Violence: Not At Risk (11/22/2021)   Humiliation, Afraid, Rape, and Kick questionnaire    Fear of Current or Ex-Partner: No    Emotionally Abused: No    Physically Abused: No    Sexually Abused: No   Family History  Problem Relation  Age of Onset   Heart disease Mother    Kidney disease Mother    Hypertension Mother    Hyperlipidemia Mother    Hypertension Father     Objective: Office vital signs reviewed. BP (!) 142/76   Pulse 99   Ht  (1.575 m)   Wt 123 lb (55.8 kg)   SpO2 98%   BMI 22.50 kg/m   Physical Examination:  General: Awake, alert, well nourished, No acute distress Skin: Several macular lesions noted throughout lower extremities bilaterally and some along the left medial aspect of the forearm.  Consistent with insect bite.  No evidence of secondary infection.  Assessment/ Plan: 72 y.o. female   Allergic reaction to insect bite - Plan: levocetirizine (XYZAL) 5 MG tablet, triamcinolone cream (KENALOG) 0.1 %, predniSONE (STERAPRED UNI-PAK 21 TAB) 10 MG (21) TBPK tablet  Suspect allergic reaction to flea bites.  Start oral antihistamine at bedtime, topical triamcinolone.  Given written rx for oral steroids if symptoms are refractory to topical.  Discussed bone health risk with systemic corticosteroids  No orders of the defined types were placed in this encounter.  No orders of the defined types were placed in this encounter.    Raliegh Ip, DO [facility] [phone]

## 2022-07-09 ENCOUNTER — Ambulatory Visit: Payer: Medicare Other | Admitting: Family Medicine

## 2022-07-14 ENCOUNTER — Ambulatory Visit (INDEPENDENT_AMBULATORY_CARE_PROVIDER_SITE_OTHER): Payer: Medicare Other

## 2022-07-14 DIAGNOSIS — E538 Deficiency of other specified B group vitamins: Secondary | ICD-10-CM

## 2022-07-14 NOTE — Progress Notes (Signed)
Cyanocobalamin injection given to right deltoid.  Patient tolerated well. 

## 2022-07-16 ENCOUNTER — Ambulatory Visit (INDEPENDENT_AMBULATORY_CARE_PROVIDER_SITE_OTHER): Payer: Medicare Other | Admitting: Family Medicine

## 2022-07-16 ENCOUNTER — Encounter: Payer: Self-pay | Admitting: Family Medicine

## 2022-07-16 VITALS — BP 130/63 | HR 76 | Temp 98.1°F | Ht 62.0 in | Wt 119.4 lb

## 2022-07-16 DIAGNOSIS — Z91038 Other insect allergy status: Secondary | ICD-10-CM

## 2022-07-16 NOTE — Progress Notes (Signed)
   Acute Office Visit  Subjective:     Patient ID: Karla [name], female    DOB: 07-01-1950, 72 y.o.   MRN: [id number]  Chief Complaint  Patient presents with   Rash    HPI Patient is in today for follow up of a rash. She was seen initially on 07/08/22 by PCP for this rash. Rash was consistent with an allergic reaction to flea bites without signs of secondary infection. She was stared on topical triamcinolone and xyzal for the itching. She reports improve in symptoms with resolutions of itching. The rash is now flatter and the color has not yet resolved which concerned Karla [name]. She sent pictures on a mychart message 2 days ago. In the pictures the flat areas looked dark red/purple in color. She denies fever, drainage, bleeding, warmth, exudate, or spreading erythema. No new lesion have presented.   ROS As per HPI.      Objective:    BP 130/63   Pulse 76   Temp 98.1 F (36.7 C) (Temporal)   Ht  (1.575 m)   Wt 119 lb 6 oz (54.1 kg)   SpO2 99%   BMI 21.83 kg/m    Physical Exam Vitals and nursing note reviewed.  Constitutional:      General: She is not in acute distress.    Appearance: She is not ill-appearing, toxic-appearing or diaphoretic.  Pulmonary:     Effort: Pulmonary effort is normal. No respiratory distress.  Skin:    General: Skin is warm and dry.     Comments: Light pink macular rash to bilateral LE. No exudate, warmth, or surround erythema. No tenderness or swelling.   Neurological:     General: No focal deficit present.     Mental Status: She is alert and oriented to person, place, and time.  Psychiatric:        Mood and Affect: Mood normal.        Behavior: Behavior normal.     No results found for any visits on 07/16/22.      Assessment & Plan:   Karla [name] was seen today for rash.  Diagnoses and all orders for this visit:  Allergic reaction to insect bite Improving. No purpura present or secondary infection. Discussed symptomatic care and return  precautions.    Return to office for new or worsening symptoms, or if symptoms persist.   The patient indicates understanding of these issues and agrees with the plan.  Gabriel Earing, FNP

## 2022-07-30 ENCOUNTER — Other Ambulatory Visit: Payer: Self-pay | Admitting: Family Medicine

## 2022-07-30 DIAGNOSIS — Z91038 Other insect allergy status: Secondary | ICD-10-CM

## 2022-07-31 ENCOUNTER — Telehealth: Payer: Self-pay | Admitting: Family Medicine

## 2022-07-31 NOTE — Telephone Encounter (Signed)
[facility] scheduled for  their annual wellness visit. Appointment made for 11/25/2022.  Thank you,  Judeth Cornfield,  AMB Clinical Support [facility] Direct Dial ??[phone]

## 2022-08-14 ENCOUNTER — Ambulatory Visit (INDEPENDENT_AMBULATORY_CARE_PROVIDER_SITE_OTHER): Payer: Medicare Other

## 2022-08-14 DIAGNOSIS — E538 Deficiency of other specified B group vitamins: Secondary | ICD-10-CM

## 2022-08-19 ENCOUNTER — Telehealth: Payer: Self-pay | Admitting: Family Medicine

## 2022-08-19 ENCOUNTER — Ambulatory Visit: Payer: Medicare Other

## 2022-08-19 DIAGNOSIS — E538 Deficiency of other specified B group vitamins: Secondary | ICD-10-CM

## 2022-08-19 DIAGNOSIS — I700 Atherosclerosis of aorta: Secondary | ICD-10-CM

## 2022-08-19 DIAGNOSIS — M810 Age-related osteoporosis without current pathological fracture: Secondary | ICD-10-CM

## 2022-08-19 DIAGNOSIS — E034 Atrophy of thyroid (acquired): Secondary | ICD-10-CM

## 2022-08-19 DIAGNOSIS — E7800 Pure hypercholesterolemia, unspecified: Secondary | ICD-10-CM

## 2022-08-19 NOTE — Telephone Encounter (Signed)
Orders Placed This Encounter  Procedures   CMP14+EGFR    Standing Status:   Future    Standing Expiration Date:   08/19/2023   Lipid panel    Standing Status:   Future    Standing Expiration Date:   08/19/2023   Vitamin B12    Standing Status:   Future    Standing Expiration Date:   08/19/2023   VITAMIN D 25 Hydroxy (Vit-D Deficiency, Fractures)    Standing Status:   Future    Standing Expiration Date:   08/19/2023   CBC    Standing Status:   Future    Standing Expiration Date:   08/19/2023   TSH    Standing Status:   Future    Standing Expiration Date:   08/19/2023   T4, free    Standing Status:   Future    Standing Expiration Date:   08/19/2023

## 2022-08-20 ENCOUNTER — Other Ambulatory Visit: Payer: Medicare Other

## 2022-08-20 DIAGNOSIS — E034 Atrophy of thyroid (acquired): Secondary | ICD-10-CM

## 2022-08-20 DIAGNOSIS — M810 Age-related osteoporosis without current pathological fracture: Secondary | ICD-10-CM

## 2022-08-20 DIAGNOSIS — E7800 Pure hypercholesterolemia, unspecified: Secondary | ICD-10-CM

## 2022-08-20 DIAGNOSIS — E538 Deficiency of other specified B group vitamins: Secondary | ICD-10-CM

## 2022-08-20 DIAGNOSIS — I700 Atherosclerosis of aorta: Secondary | ICD-10-CM

## 2022-08-20 LAB — CBC
MCHC: 34.2 g/dL (ref 31.5–35.7)
Platelets: 240 10*3/uL (ref 150–450)
RDW: 12.4 % (ref 11.7–15.4)

## 2022-08-20 LAB — VITAMIN D 25 HYDROXY (VIT D DEFICIENCY, FRACTURES)

## 2022-08-21 LAB — CMP14+EGFR
ALT: 20 IU/L (ref 0–32)
AST: 30 IU/L (ref 0–40)
Albumin/Globulin Ratio: 1.9 (ref 1.2–2.2)
Albumin: 4.4 g/dL (ref 3.9–4.9)
Alkaline Phosphatase: 62 IU/L (ref 44–121)
BUN/Creatinine Ratio: 24 (ref 12–28)
BUN: 17 mg/dL (ref 8–27)
Bilirubin Total: 0.7 mg/dL (ref 0.0–1.2)
CO2: 24 mmol/L (ref 20–29)
Calcium: 9.1 mg/dL (ref 8.7–10.3)
Chloride: 103 mmol/L (ref 96–106)
Creatinine, Ser: 0.72 mg/dL (ref 0.57–1.00)
Globulin, Total: 2.3 g/dL (ref 1.5–4.5)
Glucose: 79 mg/dL (ref 70–99)
Potassium: 4.1 mmol/L (ref 3.5–5.2)
Sodium: 141 mmol/L (ref 134–144)
Total Protein: 6.7 g/dL (ref 6.0–8.5)
eGFR: 90 mL/min/{1.73_m2} (ref >59)

## 2022-08-21 LAB — LIPID PANEL
Chol/HDL Ratio: 2.6 ratio (ref 0.0–4.4)
Cholesterol, Total: 137 mg/dL (ref 100–199)
HDL: 52 mg/dL (ref >39)
LDL Chol Calc (NIH): 71 mg/dL (ref 0–99)
Triglycerides: 66 mg/dL (ref 0–149)
VLDL Cholesterol Cal: 14 mg/dL (ref 5–40)

## 2022-08-21 LAB — CBC
Hematocrit: 37.4 % (ref 34.0–46.6)
Hemoglobin: 12.8 g/dL (ref 11.1–15.9)
MCH: 33.1 pg — ABNORMAL HIGH (ref 26.6–33.0)
MCV: 97 fL (ref 79–97)
RBC: 3.87 x10E6/uL (ref 3.77–5.28)
WBC: 5.7 10*3/uL (ref 3.4–10.8)

## 2022-08-21 LAB — T4, FREE: Free T4: 1.52 ng/dL (ref 0.82–1.77)

## 2022-08-21 LAB — VITAMIN B12: Vitamin B-12: 1205 pg/mL (ref 232–1245)

## 2022-08-21 LAB — TSH: TSH: 1.61 u[IU]/mL (ref 0.450–4.500)

## 2022-08-25 ENCOUNTER — Encounter: Payer: Self-pay | Admitting: Family Medicine

## 2022-08-25 ENCOUNTER — Ambulatory Visit (INDEPENDENT_AMBULATORY_CARE_PROVIDER_SITE_OTHER): Payer: Medicare Other | Admitting: Family Medicine

## 2022-08-25 VITALS — BP 114/67 | HR 76 | Temp 98.7°F | Ht 62.0 in | Wt 118.0 lb

## 2022-08-25 DIAGNOSIS — L989 Disorder of the skin and subcutaneous tissue, unspecified: Secondary | ICD-10-CM

## 2022-08-25 DIAGNOSIS — I700 Atherosclerosis of aorta: Secondary | ICD-10-CM

## 2022-08-25 DIAGNOSIS — M810 Age-related osteoporosis without current pathological fracture: Secondary | ICD-10-CM

## 2022-08-25 DIAGNOSIS — E7800 Pure hypercholesterolemia, unspecified: Secondary | ICD-10-CM | POA: Diagnosis not present

## 2022-08-25 DIAGNOSIS — E034 Atrophy of thyroid (acquired): Secondary | ICD-10-CM | POA: Diagnosis not present

## 2022-08-25 DIAGNOSIS — E538 Deficiency of other specified B group vitamins: Secondary | ICD-10-CM

## 2022-08-25 NOTE — Progress Notes (Signed)
Subjective: CC: Follow-up hypothyroidism, hyperlipidemia PCP: Karla Ip, DO ZOX:WRUEAV [name] is a 72 y.o. female presenting to clinic today for:  1.  Hypothyroidism Patient is compliant with her medications.  No reports of tremor, heart palpitations or change in bowel habits  2.  Hyperlipidemia Patient is compliant with Crestor pulsed dosing.  No chest pain, shortness of breath.  Had fasting labs performed  3.  knot on left foot She reports that she found a lump on the plantar aspect of the left foot about 4 weeks ago.  She is not sure if it was there previously but did not notice it until recently.  She really does not have any substantial foot pain and she does not feel that this knot is getting any bigger.  She wears supportive footwear and stays pretty physically active.  No sensory changes reported.  She was concerned because her brother suffers from a lot of foot issues and she did not want to develop something that required intervention.  ROS: Per HPI  No Known Allergies Past Medical History:  Diagnosis Date   GERD (gastroesophageal reflux disease)    Thyroid disease     Current Outpatient Medications:    Cholecalciferol (VITAMIN D) 2000 UNITS CAPS, Take 1 capsule by mouth daily., Disp: , Rfl:    DEXILANT 60 MG capsule, Take 60 mg by mouth daily., Disp: , Rfl:    estradiol (ESTRACE) 0.1 MG/GM vaginal cream, Place 0.5 g vaginally 2 (two) times a week., Disp: , Rfl:    famotidine (PEPCID) 40 MG tablet, TAKE 1 TABLET BY MOUTH EVERY DAY, Disp: , Rfl:    RESTASIS 0.05 % ophthalmic emulsion, , Disp: , Rfl: 3   rosuvastatin (CRESTOR) 5 MG tablet, TAKE 1/2 TABLET ON MONDAYS, WEDNESDAYS, AND FRIDAYS., Disp: 18 tablet, Rfl: 3   SYNTHROID 88 MCG tablet, TAKE 1 TABLET BY MOUTH EVERY DAY BEFORE BREAKFAST, Disp: 90 tablet, Rfl: 3   triamcinolone (NASACORT) 55 MCG/ACT AERO nasal inhaler, Place 2 sprays into the nose daily. Otc if not covered by ins, Disp: 1 each, Rfl:  12  Current Facility-Administered Medications:    cyanocobalamin ((VITAMIN B-12)) injection 1,000 mcg, 1,000 mcg, Intramuscular, Q30 days, Davione Lenker M, DO, 1,000 mcg at 08/14/22 4098 Social History   Socioeconomic History   Marital status: Married    Spouse name: Museum/gallery conservator    Number of children: 2   Years of education: 14   Highest education level: Associate degree: academic program  Occupational History   Occupation: Retired  Tobacco Use   Smoking status: Never   Smokeless tobacco: Never  Building services engineer Use: Never used  Substance and Sexual Activity   Alcohol use: No   Drug use: No   Sexual activity: Yes  Other Topics Concern   Not on file  Social History Narrative   Lives at home with husband. Children in [place] and [place].   Social Determinants of Health   Financial Resource Strain: Low Risk  (07/08/2022)   Overall Financial Resource Strain (CARDIA)    Difficulty of Paying Living Expenses: Not hard at all  Food Insecurity: No Food Insecurity (07/08/2022)   Hunger Vital Sign    Worried About Running Out of Food in the Last Year: Never true    Ran Out of Food in the Last Year: Never true  Transportation Needs: No Transportation Needs (07/08/2022)   PRAPARE - Administrator, Civil Service (Medical): No    Lack of Transportation (Non-Medical):  No  Physical Activity: Sufficiently Active (07/08/2022)   Exercise Vital Sign    Days of Exercise per Week: 7 days    Minutes of Exercise per Session: 40 min  Stress: No Stress Concern Present (07/08/2022)   [employer] of Occupational Health - Occupational Stress Questionnaire    Feeling of Stress : Not at all  Social Connections: Socially Integrated (07/08/2022)   Social Connection and Isolation Panel [NHANES]    Frequency of Communication with Friends and Family: More than three times a week    Frequency of Social Gatherings with Friends and Family: Twice a week    Attends Religious Services: More  than 4 times per year    Active Member of [employer] or Organizations: Yes    Attends Engineer, structural: More than 4 times per year    Marital Status: Married  Catering manager Violence: Not At Risk (11/22/2021)   Humiliation, Afraid, Rape, and Kick questionnaire    Fear of Current or Ex-Partner: No    Emotionally Abused: No    Physically Abused: No    Sexually Abused: No   Family History  Problem Relation Age of Onset   Heart disease Mother    Kidney disease Mother    Hypertension Mother    Hyperlipidemia Mother    Hypertension Father     Objective: Office vital signs reviewed. BP 114/67 Comment: home bp  Pulse 76   Temp 98.7 F (37.1 C)   Ht 5\' 2"  (1.575 m)   Wt 118 lb (53.5 kg)   SpO2 100%   BMI 21.58 kg/m   Physical Examination:  General: Awake, alert, well nourished, No acute distress HEENT: No goiter.  No exophthalmos Cardio: regular rate and rhythm, S1S2 heard, no murmurs appreciated Pulm: clear to auscultation bilaterally, no wheezes, rhonchi or rales; normal work of breathing on room air GI: Soft, nontender, nondistended.  No hepatosplenomegaly MSK: Normal gait and station  Left foot: Palpable thickening/lump noted on the plantar surface of the left foot around the midfoot.  Nontender.  Nonerythematous and nonfluctuant  Assessment/ Plan: 72 y.o. female   Skin lesion of foot - Plan: Ambulatory referral to Podiatry  Hypothyroidism due to acquired atrophy of thyroid  Pure hypercholesterolemia  Thoracic aorta atherosclerosis (HCC)  Referral to podiatry for what feels like thickening of the plantar fascia on the left foot but difficult to tell.  Discussed she may need ultrasound to further evaluate.  Not really symptomatic but wants further evaluation  Thyroid levels were within normal range.  No changes to meds  We reviewed lab results.  Continue current regimen with cholesterol medications.  Continue to stay physically active.   No orders of the  defined types were placed in this encounter.  No orders of the defined types were placed in this encounter.    Karla Ip, DO [facility] [phone]

## 2022-09-04 ENCOUNTER — Ambulatory Visit (INDEPENDENT_AMBULATORY_CARE_PROVIDER_SITE_OTHER): Payer: Medicare Other

## 2022-09-04 ENCOUNTER — Ambulatory Visit (INDEPENDENT_AMBULATORY_CARE_PROVIDER_SITE_OTHER): Payer: Medicare Other | Admitting: Podiatry

## 2022-09-04 ENCOUNTER — Encounter: Payer: Self-pay | Admitting: Podiatry

## 2022-09-04 DIAGNOSIS — M7751 Other enthesopathy of right foot: Secondary | ICD-10-CM | POA: Diagnosis not present

## 2022-09-04 DIAGNOSIS — M722 Plantar fascial fibromatosis: Secondary | ICD-10-CM

## 2022-09-04 NOTE — Progress Notes (Signed)
Subjective:  Patient ID: Karla [name], female    DOB: [birth year],  MRN: [id number] HPI Chief Complaint  Patient presents with   Foot Pain    Arch left - knot since early May, tender at times, PCP recommended having it checked   Ankle Pain    Medial ankle right - few days ago ankle was hurting, not sure of injury, but does feel a little better today   New Patient (Initial Visit)    72 y.o. female presents with the above complaint.   ROS: Denies fever chills nausea vomit muscle aches pains calf pain back pain chest pain shortness of breath  Past Medical History:  Diagnosis Date   GERD (gastroesophageal reflux disease)    Thyroid disease    Past Surgical History:  Procedure Laterality Date   CERVICAL SPINE SURGERY     herniated disc    Current Outpatient Medications:    Cholecalciferol (VITAMIN D) 2000 UNITS CAPS, Take 1 capsule by mouth daily., Disp: , Rfl:    DEXILANT 60 MG capsule, Take 60 mg by mouth daily., Disp: , Rfl:    estradiol (ESTRACE) 0.1 MG/GM vaginal cream, Place 0.5 g vaginally 2 (two) times a week., Disp: , Rfl:    famotidine (PEPCID) 40 MG tablet, TAKE 1 TABLET BY MOUTH EVERY DAY, Disp: , Rfl:    RESTASIS 0.05 % ophthalmic emulsion, , Disp: , Rfl: 3   rosuvastatin (CRESTOR) 5 MG tablet, TAKE 1/2 TABLET ON MONDAYS, WEDNESDAYS, AND FRIDAYS., Disp: 18 tablet, Rfl: 3   SYNTHROID 88 MCG tablet, TAKE 1 TABLET BY MOUTH EVERY DAY BEFORE BREAKFAST, Disp: 90 tablet, Rfl: 3   triamcinolone (NASACORT) 55 MCG/ACT AERO nasal inhaler, Place 2 sprays into the nose daily. Otc if not covered by ins, Disp: 1 each, Rfl: 12  Current Facility-Administered Medications:    cyanocobalamin ((VITAMIN B-12)) injection 1,000 mcg, 1,000 mcg, Intramuscular, Q30 days, Delynn Flavin M, DO, 1,000 mcg at 08/14/22 4098  No Known Allergies Review of Systems Objective:  There were no vitals filed for this visit.  General: Well developed, nourished, in no acute distress, alert and oriented  x3   Dermatological: Skin is warm, dry and supple bilateral. Nails x 10 are well maintained; remaining integument appears unremarkable at this time. There are no open sores, no preulcerative lesions, no rash or signs of infection present.  Vascular: Dorsalis Pedis artery and Posterior Tibial artery pedal pulses are 2/4 bilateral with immedate capillary fill time. Pedal hair growth present. No varicosities and no lower extremity edema present bilateral.   Neruologic: Grossly intact via light touch bilateral. Vibratory intact via tuning fork bilateral. Protective threshold with Semmes Wienstein monofilament intact to all pedal sites bilateral. Patellar and Achilles deep tendon reflexes 2+ bilateral. No Babinski or clonus noted bilateral.   Musculoskeletal: No gross boney pedal deformities bilateral. No pain, crepitus, or limitation noted with foot and ankle range of motion bilateral. Muscular strength 5/5 in all groups tested bilateral.  No reproducible pain on range of motion or palpation of the ankle joint right.  Left foot does demonstrate a nonpulsatile mass measuring approximately a centimeter in length along the distal medial aspect of the plantar fascia consistent with plantar fibroma.  Gait: Unassisted, Nonantalgic.    Radiographs:  Radiographs taken today right ankle demonstrate mild osteopenia with loss of mineralization but does not demonstrate any loss of continuity of the ankle mortise.  Radiographs of the left foot do not demonstrate any significant osseous abnormalities soft tissue mass in the  plantar fascia distal aspect does not demonstrate any calcifications.  Assessment & Plan:   Assessment: Plantar fibroma left foot.  Plan: Offered her injection she declined she is going to continue to watch the lesion should it increase in size she will notify us for cortisone injection.     Roschelle Calandra T. [place], [place]

## 2022-09-07 ENCOUNTER — Other Ambulatory Visit: Payer: Self-pay | Admitting: Family Medicine

## 2022-09-07 DIAGNOSIS — E034 Atrophy of thyroid (acquired): Secondary | ICD-10-CM

## 2022-09-16 ENCOUNTER — Ambulatory Visit: Payer: Medicare Other

## 2022-09-17 LAB — HM COLONOSCOPY

## 2022-09-23 ENCOUNTER — Ambulatory Visit (INDEPENDENT_AMBULATORY_CARE_PROVIDER_SITE_OTHER): Payer: Medicare Other

## 2022-09-23 DIAGNOSIS — E538 Deficiency of other specified B group vitamins: Secondary | ICD-10-CM

## 2022-09-23 NOTE — Progress Notes (Signed)
Cyanocobalamin injection given to right deltoid.  Patient tolerated well. 

## 2022-10-27 ENCOUNTER — Ambulatory Visit (INDEPENDENT_AMBULATORY_CARE_PROVIDER_SITE_OTHER): Payer: Medicare Other

## 2022-10-27 DIAGNOSIS — E538 Deficiency of other specified B group vitamins: Secondary | ICD-10-CM

## 2022-11-14 ENCOUNTER — Other Ambulatory Visit: Payer: Self-pay | Admitting: Family Medicine

## 2022-11-14 DIAGNOSIS — J301 Allergic rhinitis due to pollen: Secondary | ICD-10-CM

## 2022-11-25 ENCOUNTER — Ambulatory Visit (INDEPENDENT_AMBULATORY_CARE_PROVIDER_SITE_OTHER): Payer: Medicare Other

## 2022-11-25 VITALS — Ht 62.0 in | Wt 119.0 lb

## 2022-11-25 DIAGNOSIS — Z0000 Encounter for general adult medical examination without abnormal findings: Secondary | ICD-10-CM | POA: Diagnosis not present

## 2022-11-25 NOTE — Progress Notes (Signed)
Subjective:   Karla [name] is a 72 y.o. female who presents for Medicare Annual (Subsequent) preventive examination.  Visit Complete: Virtual  I connected with  [facility] on 11/25/22 by a audio enabled telemedicine application and verified that I am speaking with the correct person using two identifiers.  Patient Location: Home  Provider Location: Home Office  I discussed the limitations of evaluation and management by telemedicine. The patient expressed understanding and agreed to proceed.  Patient Medicare AWV questionnaire was completed by the patient on 11/25/2022; I have confirmed that all information answered by patient is correct and no changes since this date.  Review of Systems    Vital Signs: Unable to obtain new vitals due to this being a telehealth visit.  Cardiac Risk Factors include: advanced age (>43men, >20 women)     Objective:    Today's Vitals   11/25/22 0932  Weight: 119 lb (54 kg)  Height: 5\' 2"  (1.575 m)   Body mass index is 21.77 kg/m.     11/25/2022    9:36 AM 11/22/2021   10:37 AM 11/21/2020   11:25 AM 11/21/2019   11:26 AM 11/16/2018    1:34 PM  Advanced Directives  Does Patient Have a Medical Advance Directive? No No No No No  Would patient like information on creating a medical advance directive? Yes (MAU/Ambulatory/Procedural Areas - Information given) No - Patient declined No - Patient declined No - Patient declined Yes (MAU/Ambulatory/Procedural Areas - Information given)    Current Medications (verified) Outpatient Encounter Medications as of 11/25/2022  Medication Sig   Cholecalciferol (VITAMIN D) 2000 UNITS CAPS Take 1 capsule by mouth daily.   DEXILANT 60 MG capsule Take 60 mg by mouth daily.   estradiol (ESTRACE) 0.1 MG/GM vaginal cream Place 0.5 g vaginally 2 (two) times a week.   famotidine (PEPCID) 40 MG tablet TAKE 1 TABLET BY MOUTH EVERY DAY   RESTASIS 0.05 % ophthalmic emulsion    rosuvastatin (CRESTOR) 5 MG tablet TAKE 1/2 TABLET  ON MONDAYS, WEDNESDAYS, AND FRIDAYS.   SYNTHROID 88 MCG tablet TAKE 1 TABLET BY MOUTH EVERY DAY BEFORE BREAKFAST   triamcinolone (NASACORT) 55 MCG/ACT AERO nasal inhaler PLACE 2 SPRAYS INTO THE NOSE DAILY   Facility-Administered Encounter Medications as of 11/25/2022  Medication   cyanocobalamin ((VITAMIN B-12)) injection 1,000 mcg    Allergies (verified) Patient has no known allergies.   History: Past Medical History:  Diagnosis Date   GERD (gastroesophageal reflux disease)    Thyroid disease    Past Surgical History:  Procedure Laterality Date   CERVICAL SPINE SURGERY     herniated disc   Family History  Problem Relation Age of Onset   Heart disease Mother    Kidney disease Mother    Hypertension Mother    Hyperlipidemia Mother    Hypertension Father    Social History   Socioeconomic History   Marital status: Married    Spouse name: Museum/gallery conservator    Number of children: 2   Years of education: 14   Highest education level: Associate degree: academic program  Occupational History   Occupation: Retired  Tobacco Use   Smoking status: Never   Smokeless tobacco: Never  Vaping Use   Vaping status: Never Used  Substance and Sexual Activity   Alcohol use: No   Drug use: No   Sexual activity: Yes  Other Topics Concern   Not on file  Social History Narrative   Lives at home with husband. Children in  [place]   Social Determinants of Health   Financial Resource Strain: Low Risk  (11/25/2022)   Overall Financial Resource Strain (CARDIA)    Difficulty of Paying Living Expenses: Not hard at all  Food Insecurity: No Food Insecurity (11/25/2022)   Hunger Vital Sign    Worried About Running Out of Food in the Last Year: Never true    Ran Out of Food in the Last Year: Never true  Transportation Needs: No Transportation Needs (11/25/2022)   PRAPARE - Administrator, Civil Service (Medical): No    Lack of Transportation (Non-Medical): No  Physical Activity:  Sufficiently Active (11/25/2022)   Exercise Vital Sign    Days of Exercise per Week: 7 days    Minutes of Exercise per Session: 60 min  Stress: No Stress Concern Present (11/25/2022)   [employer] of Occupational Health - Occupational Stress Questionnaire    Feeling of Stress : Not at all  Social Connections: Socially Integrated (11/25/2022)   Social Connection and Isolation Panel [NHANES]    Frequency of Communication with Friends and Family: More than three times a week    Frequency of Social Gatherings with Friends and Family: More than three times a week    Attends Religious Services: More than 4 times per year    Active Member of [employer] or Organizations: Yes    Attends Engineer, structural: More than 4 times per year    Marital Status: Married    Tobacco Counseling Counseling given: Not Answered   Clinical Intake:  Pre-visit preparation completed: Yes  Pain : No/denies pain     Nutritional Risks: None Diabetes: No  How often do you need to have someone help you when you read instructions, pamphlets, or other written materials from your doctor or pharmacy?: 1 - Never  Interpreter Needed?: No  Information entered by :: Renie Ora, LPN   Activities of Daily Living    11/25/2022    9:36 AM 11/24/2022    4:28 PM  In your present state of health, do you have any difficulty performing the following activities:  Hearing? 0 0  Vision? 0 0  Difficulty concentrating or making decisions? 0 0  Walking or climbing stairs? 0 0  Dressing or bathing? 0 0  Doing errands, shopping? 0 0  Preparing Food and eating ? N N  Using the Toilet? N N  In the past six months, have you accidently leaked urine? N N  Do you have problems with loss of bowel control? N N  Managing your Medications? N N  Managing your Finances? N N  Housekeeping or managing your Housekeeping? N N    Patient Care Team: Raliegh Ip, DO as PCP - General (Family Medicine) Key, Verita Schneiders, NP as  Nurse Practitioner (Gynecology) Sharrell Ku, MD as Consulting Physician (Gastroenterology) Nelson Chimes, MD as Attending Physician (Ophthalmology) Cherlyn Roberts, MD as Referring Physician (Dermatology)  Indicate any recent Medical Services you may have received from other than [facility] providers in the past year (date may be approximate).     Assessment:   This is a routine wellness examination for Karla [name].  Hearing/Vision screen Vision Screening - Comments:: Wears rx glasses - up to date with routine eye exams with  Dr.Parker   Dietary issues and exercise activities discussed:     Goals Addressed             This Visit's Progress    Exercise 3x per week (30 min  per time)   On track    Continue to exercise for at least 30 minutes, 3 times weekly.  Stay healthy, stay active with church, family, grandchildren, etc       Depression Screen    11/25/2022    9:35 AM 08/25/2022   11:17 AM 07/16/2022    1:04 PM 07/08/2022   11:47 AM 02/21/2022    3:32 PM 12/30/2021    9:15 AM 11/22/2021   10:36 AM  PHQ 2/9 Scores  PHQ - 2 Score 0 0 0 0 0 0 0  PHQ- 9 Score  0 0 0       Fall Risk    11/25/2022    9:33 AM 11/24/2022    4:28 PM 08/25/2022   11:18 AM 07/16/2022    1:00 PM 07/08/2022   11:47 AM  Fall Risk   Falls in the past year? 0 0 0 0 0  Number falls in past yr: 0 0 0  0  Injury with Fall? 0  0  0  Risk for fall due to : No Fall Risks  No Fall Risks  No Fall Risks  Follow up Falls prevention discussed  Education provided  Education provided    MEDICARE RISK AT HOME: Medicare Risk at Home Any stairs in or around the home?: (P) Yes If so, are there any without handrails?: (P) No Home free of loose throw rugs in walkways, pet beds, electrical cords, etc?: (P) Yes Adequate lighting in your home to reduce risk of falls?: (P) Yes Life alert?: (P) No Use of a cane, walker or w/c?: (P) No Grab bars in the bathroom?: (P) Yes Shower chair or bench in shower?: (P) Yes Elevated  toilet seat or a handicapped toilet?: (P) No  TIMED UP AND GO:  Was the test performed?  No    Cognitive Function:        11/25/2022    9:36 AM 11/22/2021   10:38 AM 11/21/2019   11:28 AM 11/16/2018    1:35 PM  6CIT Screen  What Year? 0 points 0 points 0 points 0 points  What month? 0 points 0 points 0 points 0 points  What time? 0 points 0 points 0 points 0 points  Count back from 20 0 points 0 points 0 points 0 points  Months in reverse 0 points 0 points 0 points 0 points  Repeat phrase 0 points 0 points 0 points 0 points  Total Score 0 points 0 points 0 points 0 points    Immunizations Immunization History  Administered Date(s) Administered   Fluad Quad(high Dose 65+) 12/30/2018, 01/04/2020, 01/31/2021, 02/21/2022   Influenza, High Dose Seasonal PF 01/04/2018   Influenza,inj,Quad PF,6+ Mos 01/04/2014, 12/22/2014, 12/19/2015, 01/08/2017   PFIZER(Purple Top)SARS-COV-2 Vaccination 04/13/2019, 05/02/2019, 01/04/2020   Pneumococcal Conjugate-13 01/04/2014   Pneumococcal Polysaccharide-23 04/08/2018   Tdap 12/01/2013   Zoster, Live 06/05/2014    TDAP status: Up to date  Flu Vaccine status: Up to date  Pneumococcal vaccine status: Up to date  Covid-19 vaccine status: Completed vaccines  Qualifies for Shingles Vaccine? Yes   Zostavax completed No   Shingrix Completed?: No.    Education has been provided regarding the importance of this vaccine. Patient has been advised to call insurance company to determine out of pocket expense if they have not yet received this vaccine. Advised may also receive vaccine at local pharmacy or Health Dept. Verbalized acceptance and understanding.  Screening Tests Health Maintenance  Topic Date Due  INFLUENZA VACCINE  10/23/2022   COVID-19 Vaccine (4 - 2023-24 season) 11/23/2022   Zoster Vaccines- Shingrix (1 of 2) 11/25/2022 (Originally 10/21/2002)   DEXA SCAN  03/02/2023   MAMMOGRAM  06/09/2023   Medicare Annual Wellness (AWV)   11/25/2023   DTaP/Tdap/Td (2 - Td or Tdap) 12/02/2023   Colonoscopy  09/16/2032   Pneumonia Vaccine 82+ Years old  Completed   Hepatitis C Screening  Completed   HPV VACCINES  Aged Out    Health Maintenance  Health Maintenance Due  Topic Date Due   INFLUENZA VACCINE  10/23/2022   COVID-19 Vaccine (4 - 2023-24 season) 11/23/2022    Colorectal cancer screening: Type of screening: Colonoscopy. Completed 05/04/2017. Repeat every 10 years  Mammogram status: Completed 06/09/2022. Repeat every year  Bone Density status: Completed 03/02/2023. Results reflect: Bone density results: OSTEOPENIA. Repeat every 5 years.  Lung Cancer Screening: (Low Dose CT Chest recommended if Age 28-80 years, 20 pack-year currently smoking OR have quit w/in 15years.) does not qualify.   Lung Cancer Screening Referral: n/a  Additional Screening:  Hepatitis C Screening: does not qualify; Completed 12/19/2015  Vision Screening: Recommended annual ophthalmology exams for early detection of glaucoma and other disorders of the eye. Is the patient up to date with their annual eye exam?  Yes  Who is the provider or what is the name of the office in which the patient attends annual eye exams? Dr.Parker  If pt is not established with a provider, would they like to be referred to a provider to establish care? No .   Dental Screening: Recommended annual dental exams for proper oral hygiene   Community Resource Referral / Chronic Care Management: CRR required this visit?  No   CCM required this visit?  No     Plan:     I have personally reviewed and noted the following in the patient's chart:   Medical and social history Use of alcohol, tobacco or illicit drugs  Current medications and supplements including opioid prescriptions. Patient is not currently taking opioid prescriptions. Functional ability and status Nutritional status Physical activity Advanced directives List of other  physicians Hospitalizations, surgeries, and ER visits in previous 12 months Vitals Screenings to include cognitive, depression, and falls Referrals and appointments  In addition, I have reviewed and discussed with patient certain preventive protocols, quality metrics, and best practice recommendations. A written personalized care plan for preventive services as well as general preventive health recommendations were provided to patient.     Lorrene Reid, LPN   03/29/1094   After Visit Summary: (MyChart) Due to this being a telephonic visit, the after visit summary with patients personalized plan was offered to patient via MyChart   Nurse Notes: none

## 2022-11-25 NOTE — Patient Instructions (Signed)
Ms. Peterson , Thank you for taking time to come for your Medicare Wellness Visit. I appreciate your ongoing commitment to your health goals. Please review the following plan we discussed and let me know if I can assist you in the future.   Referrals/Orders/Follow-Ups/Clinician Recommendations: Aim for 30 minutes of exercise or brisk walking, 6-8 glasses of water, and 5 servings of fruits and vegetables each day.   This is a list of the screening recommended for you and due dates:  Health Maintenance  Topic Date Due   Flu Shot  10/23/2022   COVID-19 Vaccine (4 - 2023-24 season) 11/23/2022   Zoster (Shingles) Vaccine (1 of 2) 11/25/2022*   DEXA scan (bone density measurement)  03/02/2023   Mammogram  06/09/2023   Medicare Annual Wellness Visit  11/25/2023   DTaP/Tdap/Td vaccine (2 - Td or Tdap) 12/02/2023   Colon Cancer Screening  09/16/2032   Pneumonia Vaccine  Completed   Hepatitis C Screening  Completed   HPV Vaccine  Aged Out  *Topic was postponed. The date shown is not the original due date.    Advanced directives: (Copy Requested) Please bring a copy of your health care power of attorney and living will to the office to be added to your chart at your convenience.  Next Medicare Annual Wellness Visit scheduled for next year: Yes  Insert Preventive Care attachment Insert FALL PREVENTION attachment if needed

## 2022-11-27 ENCOUNTER — Ambulatory Visit (INDEPENDENT_AMBULATORY_CARE_PROVIDER_SITE_OTHER): Payer: Medicare Other

## 2022-11-27 DIAGNOSIS — E538 Deficiency of other specified B group vitamins: Secondary | ICD-10-CM

## 2022-11-27 NOTE — Progress Notes (Signed)
Cyanocobalamin injection given to right deltoid.  Patient tolerated well. 

## 2022-12-29 ENCOUNTER — Ambulatory Visit (INDEPENDENT_AMBULATORY_CARE_PROVIDER_SITE_OTHER): Payer: Medicare Other | Admitting: *Deleted

## 2022-12-29 DIAGNOSIS — E538 Deficiency of other specified B group vitamins: Secondary | ICD-10-CM

## 2022-12-29 NOTE — Progress Notes (Signed)
In today for B12 injection, pt tolerated well

## 2023-01-20 ENCOUNTER — Encounter: Payer: Self-pay | Admitting: Family

## 2023-01-20 ENCOUNTER — Ambulatory Visit (INDEPENDENT_AMBULATORY_CARE_PROVIDER_SITE_OTHER): Payer: Medicare Other | Admitting: Family

## 2023-01-20 ENCOUNTER — Other Ambulatory Visit: Payer: Self-pay | Admitting: Family

## 2023-01-20 ENCOUNTER — Ambulatory Visit (INDEPENDENT_AMBULATORY_CARE_PROVIDER_SITE_OTHER): Payer: Medicare Other

## 2023-01-20 VITALS — BP 114/68 | HR 69 | Temp 97.8°F | Ht 62.0 in | Wt 119.0 lb

## 2023-01-20 DIAGNOSIS — S6992XA Unspecified injury of left wrist, hand and finger(s), initial encounter: Secondary | ICD-10-CM

## 2023-01-20 NOTE — Progress Notes (Signed)
   Subjective:    Patient ID: Karla [name], female    DOB: March 24, 1951, 72 y.o.   MRN: [id number]  Chief Complaint  Patient presents with   Finger Injury    Finger injury play football with grandson. On left hand    PT presents to the office today with left index pain and swelling. She reports she was throwing football with grandson and caught ball wrong 5 days ago. She has iced and wore a splint.  Hand Pain  The incident occurred 5 to 7 days ago. The injury mechanism was a direct blow. The quality of the pain is described as aching. The pain is at a severity of 1/10. The pain is mild. The pain has been Intermittent since the incident. Pertinent negatives include no numbness or tingling. The symptoms are aggravated by movement. She has tried ice and rest for the symptoms. The treatment provided mild relief.      Review of Systems  Neurological:  Negative for tingling and numbness.  All other systems reviewed and are negative.      Objective:   Physical Exam Vitals reviewed.  Constitutional:      General: She is not in acute distress.    Appearance: She is well-developed.  HENT:     Head: Normocephalic and atraumatic.  Eyes:     Pupils: Pupils are equal, round, and reactive to light.  Neck:     Thyroid: No thyromegaly.  Cardiovascular:     Rate and Rhythm: Normal rate and regular rhythm.     Heart sounds: Normal heart sounds. No murmur heard. Pulmonary:     Effort: Pulmonary effort is normal. No respiratory distress.     Breath sounds: Normal breath sounds. No wheezing.  Abdominal:     General: Bowel sounds are normal. There is no distension.     Palpations: Abdomen is soft.     Tenderness: There is no abdominal tenderness.  Musculoskeletal:        General: Tenderness present. Normal range of motion.     Cervical back: Normal range of motion and neck supple.     Comments: Left index distal joint swollen and decrease ROM  Skin:    General: Skin is warm and dry.   Neurological:     Mental Status: She is alert and oriented to person, place, and time.     Cranial Nerves: No cranial nerve deficit.     Deep Tendon Reflexes: Reflexes are normal and symmetric.  Psychiatric:        Behavior: Behavior normal.        Thought Content: Thought content normal.        Judgment: Judgment normal.     BP 114/68 Comment: at home  Pulse 69   Temp 97.8 F (36.6 C) (Temporal)   Ht 5\' 2"  (1.575 m)   Wt 119 lb (54 kg)   SpO2 100%   BMI 21.77 kg/m        Assessment & Plan:   Karla [name] comes in today with chief complaint of Finger Injury (Finger injury play football with grandson. On left hand )   Diagnosis and orders addressed:  1. Injury of finger of left hand, initial encounter  Possible hair line fracture, will wait for Radiologists to read Continue to wear splint  Ice as needed Tylenol as needed    Jannifer Rodney, FNP

## 2023-01-20 NOTE — Patient Instructions (Signed)
Mallet Finger  Mallet finger is an injury that occurs when an object hits the tip of your straightened finger or thumb. It is also known as baseball finger. The blow to your fingertip causes it to bend more than normal, which tears the cord that attaches to the tip of your finger (extensor tendon).  Your extensor tendon is what straightens the end of your finger. If this tendon is damaged, you will not be able to straighten your fingertip. Sometimes, a piece of bone may be pulled away with the tendon (avulsion injury), or the tendon may tear completely. In some cases, surgery may be required to repair the damage. What are the causes? Mallet finger is caused by a hard, direct hit to the tip of your finger or thumb. This injury often happens from getting hit in the finger with a hard ball, such as a baseball. What increases the risk? This injury is more likely to happen if you play a sport that uses a hard ball. What are the signs or symptoms? The main symptom of this injury is the inability to straighten the tip of your finger. You can manually straighten your fingertip with your other hand, but the finger cannot straighten on its own. Other symptoms may include: Pain. Swelling. Bruising. Blood under the fingernail. How is this diagnosed? Your health care provider may suspect mallet finger if you are not able to extend your fingertip, especially if you recently injured your hand. Your health care provider will do a physical exam. This may include X-rays to see if a piece of bone has been pulled away or if the finger joint has separated (dislocated). How is this treated? Mallet finger may be treated with: A splint on your fingertip to keep it straight (extended) while the tendon heals. Surgery to repair the tendon. This is done in severe cases. This may involve: Using a pin or screw to keep your finger extended and your tendon attached. Using a piece of tendon from another part of your body  (graft) to replace a torn tendon. Follow these instructions at home: If you have a removable splint: Wear the splint as told by your health care provider. Remove it only as told by your health care provider. Check the skin around the splint every day. Tell your health care provider about any concerns. Loosen the splint if your fingers tingle, become numb, or turn cold and blue. Keep the splint clean. If the splint is not waterproof: Do not let it get wet. Cover it with a watertight covering when you take a bath or a shower. If you remove your splint to dry it or change it: Gently press your finger on a flat surface to keep it straight. Failing to do so may lead to a permanent injury or force you to wear the splint for a longer period of time. Check the skin under the splint. Tell your health care provider if you notice a blister or red and raw skin. Managing pain, stiffness, and swelling  If directed, put ice on the injured area. To do this: If you have a removable splint, remove it as told by your health care provider. Put ice in a plastic bag. Place a towel between your skin and the bag. Leave the ice on for 20 minutes, 2-3 times a day. Remove the ice if your skin turns bright red. This is very important. If you cannot feel pain, heat, or cold, you have a greater risk of damage to the area.  Move your fingers often to reduce stiffness and swelling. Raise (elevate)the injured area above the level of your heart while you are sitting or lying down. General instructions Take over-the-counter and prescription medicines only as told by your health care provider. Ask your health care provider if the medicine prescribed to you requires you to avoid driving or using machinery. Keep all follow-up visits. This is important. Contact a health care provider if: You have pain or swelling that is getting worse. Your finger feels cold. You cannot extend your finger after treatment. You notice that the  skin under the splint is red, raw, or has a blister. Get help right away if: Even after loosening your splint, your finger is: Very red and swollen. White or blue. Numb or tingling. Summary Mallet finger is an injury that occurs from a hard, direct hit to the tip of your finger or thumb. The blow to your fingertip causes it to bend more than normal, tearing the tendon that straightens the end of your finger. You cannot straighten your fingertip if this tendon is torn. This injury often happens from getting hit in the finger with a hard ball, such as a baseball. Treatment will depend on how severe the injury is. You may need to wear a splint to keep the finger straight while it heals. A more severe injury may require surgery to repair the tendon. This information is not intended to replace advice given to you by your health care provider. Make sure you discuss any questions you have with your health care provider. Document Revised: 02/01/2020 Document Reviewed: 02/01/2020 Elsevier Patient Education  2024 [employer].

## 2023-01-30 ENCOUNTER — Ambulatory Visit (INDEPENDENT_AMBULATORY_CARE_PROVIDER_SITE_OTHER): Payer: Medicare Other | Admitting: *Deleted

## 2023-01-30 DIAGNOSIS — E538 Deficiency of other specified B group vitamins: Secondary | ICD-10-CM | POA: Diagnosis not present

## 2023-01-30 NOTE — Progress Notes (Signed)
Pt in today for B12 injection, tolerated well.

## 2023-02-23 ENCOUNTER — Encounter: Payer: Self-pay | Admitting: Family Medicine

## 2023-02-23 ENCOUNTER — Ambulatory Visit (INDEPENDENT_AMBULATORY_CARE_PROVIDER_SITE_OTHER): Payer: Medicare Other | Admitting: Family Medicine

## 2023-02-23 ENCOUNTER — Ambulatory Visit: Payer: Medicare Other

## 2023-02-23 VITALS — BP 134/67 | HR 79 | Temp 98.3°F | Ht 62.0 in | Wt 120.0 lb

## 2023-02-23 DIAGNOSIS — M25551 Pain in right hip: Secondary | ICD-10-CM | POA: Diagnosis not present

## 2023-02-23 DIAGNOSIS — I700 Atherosclerosis of aorta: Secondary | ICD-10-CM

## 2023-02-23 DIAGNOSIS — M20012 Mallet finger of left finger(s): Secondary | ICD-10-CM | POA: Diagnosis not present

## 2023-02-23 DIAGNOSIS — E538 Deficiency of other specified B group vitamins: Secondary | ICD-10-CM

## 2023-02-23 DIAGNOSIS — M810 Age-related osteoporosis without current pathological fracture: Secondary | ICD-10-CM | POA: Diagnosis not present

## 2023-02-23 DIAGNOSIS — Z0000 Encounter for general adult medical examination without abnormal findings: Secondary | ICD-10-CM

## 2023-02-23 DIAGNOSIS — E7800 Pure hypercholesterolemia, unspecified: Secondary | ICD-10-CM

## 2023-02-23 DIAGNOSIS — E034 Atrophy of thyroid (acquired): Secondary | ICD-10-CM

## 2023-02-23 MED ORDER — ROSUVASTATIN CALCIUM 5 MG PO TABS: ORAL_TABLET | ORAL | 3 refills | Status: DC

## 2023-02-23 MED ORDER — LEVOTHYROXINE SODIUM 88 MCG PO TABS: 88.0000 ug | ORAL_TABLET | Freq: Every day | ORAL | 3 refills | Status: DC

## 2023-02-23 NOTE — Progress Notes (Signed)
Karla [name] is a 72 y.o. female presents to office today for annual physical exam examination.    Concerns today include: 1.  Right-sided hip pain Patient reports that she is been having some intermittent right sided hip pain.  This seems to be exacerbated by standing or walking for prolonged amount of time and relieved by sitting.  She hit her hip against a piece of furniture 2 weeks ago but really did not have any issues.  She is able to walk independently without a limp.  Sometimes it radiates to her groin and sometimes it radiates to her right buttock.  2.  Left finger injury She sustained a injury to the distal left index finger about 6 weeks ago.  She was found to have an avulsion fracture.  She notes some persistent discomfort at the tip of her finger.  She has been keeping it splinted as directed.  Having difficulty with pincer movements like flossing her teeth  There are no preventive care reminders to display for this patient.  Refills needed today: Crestor  Immunization History  Administered Date(s) Administered   Fluad Quad(high Dose 65+) 12/30/2018, 01/04/2020, 01/31/2021, 02/21/2022   Influenza, High Dose Seasonal PF 01/04/2018   Influenza,inj,Quad PF,6+ Mos 01/04/2014, 12/22/2014, 12/19/2015, 01/08/2017   Influenza-Unspecified 01/30/2023   PFIZER(Purple Top)SARS-COV-2 Vaccination 04/13/2019, 05/02/2019, 01/04/2020   Pneumococcal Conjugate-13 01/04/2014   Pneumococcal Polysaccharide-23 04/08/2018   Tdap 12/01/2013   Zoster, Live 06/05/2014   Past Medical History:  Diagnosis Date   GERD (gastroesophageal reflux disease)    Thyroid disease    Social History   Socioeconomic History   Marital status: Married    Spouse name: Museum/gallery conservator    Number of children: 2   Years of education: 14   Highest education level: Associate degree: academic program  Occupational History   Occupation: Retired  Tobacco Use   Smoking status: Never   Smokeless tobacco: Never  Vaping Use    Vaping status: Never Used  Substance and Sexual Activity   Alcohol use: No   Drug use: No   Sexual activity: Yes  Other Topics Concern   Not on file  Social History Narrative   Lives at home with husband. Children in [place].   Social Determinants of Health   Financial Resource Strain: Low Risk  (01/19/2023)   Overall Financial Resource Strain (CARDIA)    Difficulty of Paying Living Expenses: Not hard at all  Food Insecurity: No Food Insecurity (01/19/2023)   Hunger Vital Sign    Worried About Running Out of Food in the Last Year: Never true    Ran Out of Food in the Last Year: Never true  Transportation Needs: No Transportation Needs (01/19/2023)   PRAPARE - Administrator, Civil Service (Medical): No    Lack of Transportation (Non-Medical): No  Physical Activity: Sufficiently Active (01/19/2023)   Exercise Vital Sign    Days of Exercise per Week: 7 days    Minutes of Exercise per Session: 60 min  Stress: No Stress Concern Present (01/19/2023)   [employer] of Occupational Health - Occupational Stress Questionnaire    Feeling of Stress : Not at all  Social Connections: Socially Integrated (01/19/2023)   Social Connection and Isolation Panel [NHANES]    Frequency of Communication with Friends and Family: More than three times a week    Frequency of Social Gatherings with Friends and Family: Three times a week    Attends Religious Services: More than 4 times per  year    Active Member of Clubs or Organizations: Yes    Attends Banker Meetings: More than 4 times per year    Marital Status: Married  Catering manager Violence: Not At Risk (11/25/2022)   Humiliation, Afraid, Rape, and Kick questionnaire    Fear of Current or Ex-Partner: No    Emotionally Abused: No    Physically Abused: No    Sexually Abused: No   Past Surgical History:  Procedure Laterality Date   CERVICAL SPINE SURGERY     herniated disc   Family History   Problem Relation Age of Onset   Heart disease Mother    Kidney disease Mother    Hypertension Mother    Hyperlipidemia Mother    Hypertension Father     Current Outpatient Medications:    Cholecalciferol (VITAMIN D) 2000 UNITS CAPS, Take 1 capsule by mouth daily., Disp: , Rfl:    DEXILANT 60 MG capsule, Take 60 mg by mouth daily., Disp: , Rfl:    estradiol (ESTRACE) 0.1 MG/GM vaginal cream, Place 0.5 g vaginally 2 (two) times a week., Disp: , Rfl:    famotidine (PEPCID) 40 MG tablet, TAKE 1 TABLET BY MOUTH EVERY DAY, Disp: , Rfl:    RESTASIS 0.05 % ophthalmic emulsion, , Disp: , Rfl: 3   rosuvastatin (CRESTOR) 5 MG tablet, TAKE 1/2 TABLET ON MONDAYS, WEDNESDAYS, AND FRIDAYS., Disp: 18 tablet, Rfl: 3   SYNTHROID 88 MCG tablet, TAKE 1 TABLET BY MOUTH EVERY DAY BEFORE BREAKFAST, Disp: 90 tablet, Rfl: 3   triamcinolone (NASACORT) 55 MCG/ACT AERO nasal inhaler, PLACE 2 SPRAYS INTO THE NOSE DAILY, Disp: 16.9 each, Rfl: 8  Current Facility-Administered Medications:    cyanocobalamin ((VITAMIN B-12)) injection 1,000 mcg, 1,000 mcg, Intramuscular, Q30 days, Jhanae Jaskowiak M, DO, 1,000 mcg at 01/30/23 1000  No Known Allergies   ROS: Review of Systems Pertinent items are noted in HPI.    Physical exam BP 134/67   Pulse 79   Temp 98.3 F (36.8 C)   Ht 5\' 2"  (1.575 m)   Wt 120 lb (54.4 kg)   SpO2 100%   BMI 21.95 kg/m  General appearance: alert, cooperative, appears stated age, and no distress Head: Normocephalic, without obvious abnormality, atraumatic Eyes: conjunctivae/corneas clear. PERRL, EOM's intact.   Ears: normal TM's and external ear canals both ears Nose: Nares normal. Septum midline. Mucosa normal. No drainage or sinus tenderness. Throat: lips, mucosa, and tongue normal; teeth and gums normal Neck: no adenopathy, supple, symmetrical, trachea midline, and thyroid not enlarged, symmetric, no tenderness/mass/nodules Back: symmetric, no curvature. ROM normal. No CVA  tenderness. Lungs: clear to auscultation bilaterally Heart: regular rate and rhythm, S1, S2 normal, no murmur, click, rub or gallop Abdomen: soft, non-tender; bowel sounds normal; no masses,  no organomegaly Extremities:  Mallet finger of the DIP on the left index appreciated.  She still has some residual discomfort with palpation.  Range of motion is limited.  Right hip without tenderness to palpation to the trochanteric bursa.  Negative FADIR.  Negative FABER.  Negative straight leg raise. Pulses: 2+ and symmetric Skin: Skin color, texture, turgor normal. No rashes or lesions Lymph nodes: Cervical, supraclavicular, and axillary nodes normal. Neurologic: Grossly normal      02/23/2023    8:27 AM 01/20/2023   10:11 AM 11/25/2022    9:35 AM  Depression screen PHQ 2/9  Decreased Interest 0 0 0  Down, Depressed, Hopeless 0 0 0  PHQ - 2 Score 0 0  0  Altered sleeping 0 0   Tired, decreased energy 0 0   Change in appetite 0 0   Feeling bad or failure about yourself  0 0   Trouble concentrating 0 0   Moving slowly or fidgety/restless 0 0   Suicidal thoughts 0 0   PHQ-9 Score 0 0   Difficult doing work/chores Not difficult at all Not difficult at all       02/23/2023    8:28 AM 01/20/2023   10:11 AM 08/25/2022   11:24 AM 07/16/2022    1:04 PM  GAD 7 : Generalized Anxiety Score  Nervous, Anxious, on Edge 0 0 0 0  Control/stop worrying 0 0 0 0  Worry too much - different things 0 0 0 0  Trouble relaxing 0 0 0 0  Restless 0 0 0 0  Easily annoyed or irritable 0 0 0 0  Afraid - awful might happen 0 0 0 0  Total GAD 7 Score 0 0 0 0  Anxiety Difficulty Not difficult at all Not difficult at all Not difficult at all Not difficult at all     Assessment/ Plan: [facility] here for annual physical exam.   Annual physical exam  Age-related osteoporosis without current pathological fracture - Plan: VITAMIN D 25 Hydroxy (Vit-D Deficiency, Fractures), CMP14+EGFR, DG Bone Density  Thoracic  aorta atherosclerosis (HCC)  Pure hypercholesterolemia - Plan: CMP14+EGFR, Lipid panel, rosuvastatin (CRESTOR) 5 MG tablet  Hypothyroidism due to acquired atrophy of thyroid - Plan: T4, free, TSH, levothyroxine (SYNTHROID) 88 MCG tablet  B12 deficiency - Plan: Vitamin B12  Mallet finger of left hand - Plan: Ambulatory referral to Orthopedic Surgery  Right hip pain  Fasting labs collected.  Check vitamin D, calcium level.  Will arrange DEXA scan at breast center with her mammogram in March.  Future order for DEXA scan placed.  Continue adequate vitamin D and calcium  Cholesterol, CMP collected.  Continue statin as directed  Asymptomatic from a thyroid standpoint.  Check thyroid levels  Check B12 level given history of deficiency.  Continue injection therapy if needed  Referral to orthopedic surgery for mallet finger of left hand.  Given her physical therapy exercises for her right hip.  She had negative FADIR, negative FABER and negative straight leg raise  Counseled on healthy lifestyle choices, including diet (rich in fruits, vegetables and lean meats and low in salt and simple carbohydrates) and exercise (at least 30 minutes of moderate physical activity daily).  Patient to follow up 1 year for CPE  Lynnea Vandervoort M. Nadine Counts, DO

## 2023-02-24 LAB — CMP14+EGFR
ALT: 15 [IU]/L (ref 0–32)
AST: 28 [IU]/L (ref 0–40)
Albumin: 4.6 g/dL (ref 3.9–4.9)
Alkaline Phosphatase: 63 [IU]/L (ref 44–121)
BUN/Creatinine Ratio: 16 (ref 12–28)
BUN: 12 mg/dL (ref 8–27)
Bilirubin Total: 0.8 mg/dL (ref 0.0–1.2)
CO2: 25 mmol/L (ref 20–29)
Calcium: 9.3 mg/dL (ref 8.7–10.3)
Chloride: 101 mmol/L (ref 96–106)
Creatinine, Ser: 0.73 mg/dL (ref 0.57–1.00)
Globulin, Total: 2.6 g/dL (ref 1.5–4.5)
Glucose: 80 mg/dL (ref 70–99)
Potassium: 4.8 mmol/L (ref 3.5–5.2)
Sodium: 142 mmol/L (ref 134–144)
Total Protein: 7.2 g/dL (ref 6.0–8.5)
eGFR: 88 mL/min/{1.73_m2} (ref >59)

## 2023-02-24 LAB — LIPID PANEL
Chol/HDL Ratio: 2.7 {ratio} (ref 0.0–4.4)
Cholesterol, Total: 166 mg/dL (ref 100–199)
HDL: 61 mg/dL (ref >39)
LDL Chol Calc (NIH): 90 mg/dL (ref 0–99)
Triglycerides: 79 mg/dL (ref 0–149)
VLDL Cholesterol Cal: 15 mg/dL (ref 5–40)

## 2023-02-24 LAB — TSH: TSH: 2.13 u[IU]/mL (ref 0.450–4.500)

## 2023-02-24 LAB — VITAMIN D 25 HYDROXY (VIT D DEFICIENCY, FRACTURES): Vit D, 25-Hydroxy: 62.9 ng/mL (ref 30.0–100.0)

## 2023-02-24 LAB — T4, FREE: Free T4: 1.45 ng/dL (ref 0.82–1.77)

## 2023-02-24 LAB — VITAMIN B12: Vitamin B-12: 1032 pg/mL (ref 232–1245)

## 2023-03-02 ENCOUNTER — Ambulatory Visit (INDEPENDENT_AMBULATORY_CARE_PROVIDER_SITE_OTHER): Payer: Medicare Other

## 2023-03-02 DIAGNOSIS — E538 Deficiency of other specified B group vitamins: Secondary | ICD-10-CM

## 2023-03-02 NOTE — Progress Notes (Signed)
Cyanocobalamin injection given to left deltoid.  Patient tolerated well. 

## 2023-03-03 ENCOUNTER — Ambulatory Visit
Admission: RE | Admit: 2023-03-03 | Discharge: 2023-03-03 | Disposition: A | Discharge status: Home / Self Care | Payer: Medicare Other | Source: Ambulatory Visit | Attending: Family Medicine | Admitting: Family Medicine

## 2023-03-03 DIAGNOSIS — M810 Age-related osteoporosis without current pathological fracture: Secondary | ICD-10-CM

## 2023-03-30 ENCOUNTER — Ambulatory Visit: Payer: Medicare Other | Admitting: Orthopedic Surgery

## 2023-03-30 ENCOUNTER — Other Ambulatory Visit (INDEPENDENT_AMBULATORY_CARE_PROVIDER_SITE_OTHER): Payer: Medicare Other

## 2023-03-30 DIAGNOSIS — M20032 Swan-neck deformity of left finger(s): Secondary | ICD-10-CM | POA: Diagnosis not present

## 2023-03-30 DIAGNOSIS — M20012 Mallet finger of left finger(s): Secondary | ICD-10-CM

## 2023-03-30 NOTE — Progress Notes (Signed)
 Karla [name] - 73 y.o. female MRN [id number]  Date of birth: 1951-01-26  Office Visit Note: Visit Date: 03/30/2023 PCP: [name], DO Referred by: [name], DO  Subjective: No chief complaint on file.  HPI: Karla [name] is a pleasant 73 y.o. female who presents today for evaluation of a left index mallet injury that occurred in October of this year.  She was seen by her family practitioner and placed into an AlumaFoam splint, which she subsequently wore for approximately 8 weeks as instructed.  The splint did spanned both the DIP and PIP of the index finger.  Currently, she does have an ongoing flexion deformity at the DIP with associated mild hyperextension at the PIP, consistent with a mild swan-neck deformity.  Pertinent ROS were reviewed with the patient and found to be negative unless otherwise specified above in HPI.   Visit Reason: left index finger Duration of symptoms: October 2024 Hand dominance: right Occupation:retired Diabetic: No Smoking: No Heart/Lung History: none Blood Thinners: none  Prior Testing/EMG: 12/2022 Injections (Date): none Treatments: aluminum splint Prior Surgery: none  Assessment & Plan: Visit Diagnoses:  1. Mallet finger of left finger(s)     Plan: Extensive discussion was had the patient today regarding her left index finger injury.  I reviewed the results of her previous x-rays as well as the x-rays taken today which do show a bony mallet injury with associated flexion deformity.  Unfortunately, she has also developed a subsequent mild swan-neck deformity with PIP hyperextension.  We discussed the underlying pathophysiology of this condition as well as the potential treatment options.  From a conservative standpoint, we discussed ongoing mallet splinting with potential incorporation of a splint to correct the PIP hyperextension.  She will also need to do some aggressive therapy for the PIP without assist on range of motion  in order to help with the ongoing hyperextension.  From a surgical standpoint, I discussed the possibility of DIP fusion, particular given the underlying arthritis at the DIP joint, this may help correct the PIP pathology when the DIP is addressed.  When held a straight position at the DIP on clinical examination today, the PIP hyperextension does slightly correct which is promising.  We did discuss the possibility of volar plate advancement with potential central slip tenotomy should her PIP hyperextension persist in the future.  Understanding her options, she would like to continue with conservative measures for the time being which I am in agreement with.  I discussed her case in detail with our occupational therapist, who will see her tomorrow for fabrication of an orthosis to incorporate both the DIP and PIP and address the underlying pathology.  She will return to me as needed moving forward should her symptoms remain refractory to conservative care.  Greater than 45 minutes was spent in the care of this patient reviewing previous documentation, imaging, examination and discussion with patient as well as the Occupational Therapy staff.  Follow-up: No follow-ups on file.   Meds & Orders: No orders of the defined types were placed in this encounter.   Orders Placed This Encounter  Procedures   XR Finger Index Left   Ambulatory referral to Occupational Therapy     Procedures: No procedures performed      Clinical History: No specialty comments available.  She reports that she has never smoked. She has never used smokeless tobacco. No results for input(s): HGBA1C, LABURIC in the last 8760 hours.  Objective:   Vital Signs: There  were no vitals taken for this visit.  Physical Exam  Gen: Well-appearing, in no acute distress; non-toxic CV: Regular Rate. Well-perfused. Warm.  Resp: Breathing unlabored on room air; no wheezing. Psych: Fluid speech in conversation; appropriate affect;  normal thought process  Ortho Exam Left index finger: - Flexion deformity notable at the DIP, passively correctable, joint remains supple - Skin is intact throughout, slight skin irritation over the dorsal aspect of the DIP with associated mild erythema - PIP with slight hyperextension approximately 5 degrees, also passively correctable, remains supple  Imaging: XR Finger Index Left Result Date: 03/30/2023 X-rays of the left index finger, multiple views were performed today X-rays demonstrate bony mallet injury of the DIP with associated flexion deformity, no joint subluxation.  There is notable degenerative change at the DIP with joint space narrowing and osteophyte formation.  PIP is seen in a hyperextended position on the lateral view.   Past Medical/Family/Surgical/Social History: Medications & Allergies reviewed per EMR, new medications updated. Patient Active Problem List   Diagnosis Date Noted   Calculus of gallbladder without cholecystitis without obstruction 06/19/2022   Hernia, hiatal 02/09/2019   Laryngopharyngeal reflux (LPR) 08/23/2018   Osteopenia after menopause 08/05/2018   Osteoporosis 09/15/2016   Vitamin D  deficiency 07/31/2016   Menopausal and perimenopausal disorder 12/08/2014   GERD (gastroesophageal reflux disease) 07/25/2013   Hypothyroidism 07/25/2013   Past Medical History:  Diagnosis Date   GERD (gastroesophageal reflux disease)    Thyroid  disease    Family History  Problem Relation Age of Onset   Heart disease Mother    Kidney disease Mother    Hypertension Mother    Hyperlipidemia Mother    Hypertension Father    Past Surgical History:  Procedure Laterality Date   CERVICAL SPINE SURGERY     herniated disc   Social History   Occupational History   Occupation: Retired  Tobacco Use   Smoking status: Never   Smokeless tobacco: Never  Vaping Use   Vaping status: Never Used  Substance and Sexual Activity   Alcohol use: No   Drug use: No    Sexual activity: Yes    Amariyon Maynes Afton Alderton, M.D. [facility] 8:11 PM

## 2023-03-31 ENCOUNTER — Ambulatory Visit: Payer: Medicare Other | Admitting: Rehabilitative and Restorative Service Providers"

## 2023-03-31 ENCOUNTER — Encounter: Payer: Self-pay | Admitting: Rehabilitative and Restorative Service Providers"

## 2023-03-31 DIAGNOSIS — M25642 Stiffness of left hand, not elsewhere classified: Secondary | ICD-10-CM

## 2023-03-31 DIAGNOSIS — M20012 Mallet finger of left finger(s): Secondary | ICD-10-CM | POA: Diagnosis not present

## 2023-03-31 DIAGNOSIS — M6281 Muscle weakness (generalized): Secondary | ICD-10-CM

## 2023-03-31 DIAGNOSIS — R278 Other lack of coordination: Secondary | ICD-10-CM

## 2023-03-31 DIAGNOSIS — R600 Localized edema: Secondary | ICD-10-CM

## 2023-03-31 DIAGNOSIS — M79642 Pain in left hand: Secondary | ICD-10-CM

## 2023-03-31 NOTE — Therapy (Signed)
 OUTPATIENT OCCUPATIONAL THERAPY ORTHO EVALUATION  Patient Name: Karla [name] MRN: [id number] DOB:[birth year], 73 y.o., female Today's Date: 03/31/2023  PCP: Jolinda MD REFERRING PROVIDER: Arlinda Buster, MD   END OF SESSION:  OT End of Session - 03/31/23 1519     Visit Number 1    Number of Visits 8    Date for OT Re-Evaluation 06/12/23    Authorization Type Medicare    OT Start Time 1519    OT Stop Time 1629    OT Time Calculation (min) 70 min    Equipment Utilized During Treatment orthotic materials    Activity Tolerance Patient tolerated treatment well;No increased pain;Patient limited by fatigue;Patient limited by pain    Behavior During Therapy [facility] for tasks assessed/performed             Past Medical History:  Diagnosis Date   GERD (gastroesophageal reflux disease)    Thyroid  disease    Past Surgical History:  Procedure Laterality Date   CERVICAL SPINE SURGERY     herniated disc   Patient Active Problem List   Diagnosis Date Noted   Calculus of gallbladder without cholecystitis without obstruction 06/19/2022   Hernia, hiatal 02/09/2019   Laryngopharyngeal reflux (LPR) 08/23/2018   Osteopenia after menopause 08/05/2018   Osteoporosis 09/15/2016   Vitamin D  deficiency 07/31/2016   Menopausal and perimenopausal disorder 12/08/2014   GERD (gastroesophageal reflux disease) 07/25/2013   Hypothyroidism 07/25/2013    ONSET DATE: Oct 2024  REFERRING DIAG: M20.012 (ICD-10-CM) - Mallet finger of left finger(s)   THERAPY DIAG:  Mallet finger of left finger(s)  Localized edema  Muscle weakness (generalized)  Pain in left hand  Stiffness of left hand, not elsewhere classified  Other lack of coordination  Rationale for Evaluation and Treatment: Rehabilitation  SUBJECTIVE:   SUBJECTIVE STATEMENT: She states that she injured her finger playing catch with a football in October 2024.  She had an avulsion fracture to the terminal tendon to the left index  finger and resulting mallet finger injury.  She states that she was given an AlumaFoam will splint which went over her entire left index finger that she wore for the better part of 2 months.  Unfortunately she also states removing it frequently to sleep and also to take baths and showers.  Now she presents with a swan-neck deformity in the left index finger, stiffness, pain, swelling and decreased functional ability.     PERTINENT HISTORY: Per MD note: left index mallet injury that occurred in October of this year (2024). She was seen by her family practitioner and placed into an AlumaFoam splint, which she subsequently wore for approximately 8 weeks as instructed. The splint did spanned both the DIP and PIP of the index finger.   PRECAUTIONS: None  RED FLAGS: None   WEIGHT BEARING RESTRICTIONS: Yes <5# in Lt hand now  PAIN:  Are you having pain? No  FALLS: Has patient fallen in last 6 months? No  LIVING ENVIRONMENT: Lives with: lives with their family Lives in: House/apartment Has following equipment at home: None  PLOF: Independent  PATIENT GOALS: To improve the use and mobility of her left nondominant hand  NEXT MD VISIT: As needed    OBJECTIVE: (All objective assessments below are from initial evaluation on: 03/31/23 unless otherwise specified.)   HAND DOMINANCE: Right   ADLs: Overall ADLs: States decreased ability to grab, hold household objects, pain and difficulty to open containers, perform FMS tasks (manipulate fasteners on clothing), mild bathing problems as well.  FUNCTIONAL OUTCOME MEASURES: Eval: Quick DASH 22.7% impairment today  (Higher % Score  =  More Impairment)     UPPER EXTREMITY ROM     Shoulder to Wrist AROM Left eval  Wrist flexion Within functional limits  Wrist extension Within functional limits  (Blank rows = not tested)   Hand AROM Left eval  Full Fist Ability (or Gap to Distal Palmar Crease) Unable with the left index finger due to  stiffness in a swan-neck position  Thumb Opposition  (Kapandji Scale)  WFL  Index MCP (0-90) Some stiffness and decreased flexion, full extension  Index PIP (0-100) Stiffness in hyperextension with no significant ability to bend into flexion  Index DIP (0-70) Drooping about 30 degrees into flexion with inability to correct, also stiff to flex  (Blank rows = not tested)   HAND FUNCTION: Eval: Observed weakness in affected Lt hand, due to stiffness and weakness through the index finger.  Details will be tested after healing phase which is at least 6 to 8 weeks. Grip strength Right: TBD lbs, Left: TBD lbs   COORDINATION: Eval: Observed coordination impairments with affected left hand, as she is unable to effectively use her index finger.  Details will be tested when able after the initial 6 to 8-week healing phase. 9 Hole Peg Test Left: TBD sec (TBD sec is WFL)   SENSATION: Eval:  Light touch intact today, though states occasional tingling in the tip of her index finger  EDEMA:   Eval:  Mildly swollen in left index finger and hand  COGNITION: Eval: Overall cognitive status: WFL for evaluation today   OBSERVATIONS:   Eval: Overt mallet injury that has progressed to a swan-neck deformity now over the course of 2 to 3 months since initial injury.  She is largely nontender and PIP joint is passively flexible into some flexion, approximately 30 to 40 degrees.  DIP joint is able to make full extension passively but hyperextension is somewhat difficult as she is stiff.   TODAY'S TREATMENT:  Post-evaluation treatment:   Custom orthotic fabrication was indicated due to pt's healing bony mallet injury and need for safe, functional positioning. OT fabricated several custom orthoses for pt today to help correct her deformities and protect her finger.  The first 2 are identical mallet injury orthoses to immobilize the DIP joint and to slight hyperextension.  She needed to so that she can wear 1  into the shower and carefully switched to the second 1 that is dry after the shower.  She was trained on this thoroughly and shows good skill in doing this orthosis transfer without allowing the DIP to droop into flexion.  OT emphasizes many times (for safety/self-care) to never take off this brace and allow the DIP joint to bend over the course of at least 6 to 8 weeks.   The next orthosis fabricated today was a relative motion orthosis into extension for the left index finger.  This emphasizes her bending the PIP joint into flexion.  It helps her rest in a more bent posture and eliminates hyperextension at the PIP joint.  She does still show habit of holding her finger rigidly extended and she was cautioned repeatedly to try to break this habit and rest within normal bent PIP joint.  At the end of the session all of these orthoses fit well with no areas of pressure, pt states a comfortable fit. Pt was educated on the wearing schedule (on at all times for mallet orthoses), to avoid  exposing it to sources of heat, to wipe clean as needed (do not wash, use harsh detergents), to call or come in ASAP if it is causing any irritation or is not achieving desired function.  They will be checked/adjusted in upcoming sessions, as needed. Pt states understanding all directions.     PATIENT EDUCATION: Education details: See tx section above for details  Person educated: Patient Education method: Verbal Instruction, Teach back, Handouts  Education comprehension: States and demonstrates understanding, Additional Education required    HOME EXERCISE PROGRAM: See tx section above for details    GOALS: Goals reviewed with patient? Yes   SHORT TERM GOALS: (STG required if POC>30 days) Target Date: 03/31/2023  Pt will obtain protective, custom orthotic. Goal status: 03/31/2023 MET     LONG TERM GOALS: Target Date: 06/12/2023  Pt will improve functional ability by decreased impairment per Quick DASH  assessment from 22.7% to 15% or better, for better quality of life. Goal status: INITIAL  2.  Pt will improve grip strength in left hand to at least 35 lbs for functional use at home and in IADLs. (After initial 6 to 8 weeks of healing phase) Goal status: INITIAL  3.  Pt will improve A/ROM in left index finger total active motion to at least 220*, to have functional motion for tasks like reach and grasp.  (After initial 6 to 8 weeks of healing phase) Goal status: INITIAL  4.  Pt will demo/state understanding of initial HEP to improve pain levels and prerequisite motion. (After initial healing phase for 6 to 8 weeks) Goal status: INITIAL  5.  Pt will improve coordination skills in left hand, as seen by within functional limit score on nine-hole peg testing to have increased functional ability to carry out fine motor tasks (fasteners, etc.) and more complex, coordinated IADLs (meal prep, sports, etc.).  (After initial 6 to 8 weeks of healing phase) Goal status: INITIAL    ASSESSMENT:  CLINICAL IMPRESSION: Patient is a 73 y.o. female who was seen today for occupational therapy evaluation for decreased functional ability in the left nondominant hand as well as stiffness and pain and swelling in the left index finger after a bony mallet injury and improper use of braces that led to now a swan-neck deformity.  She will benefit from outpatient occupational therapy to decrease symptoms and increase quality of life, but it must start with a period of rest and immobilization for 6 to 8 weeks.  She was educated copiously today to not allow the DIP joint to sag into flexion for at least 6 to 8 weeks.   PERFORMANCE DEFICITS: in functional skills including ADLs, IADLs, coordination, dexterity, edema, ROM, strength, pain, flexibility, Fine motor control, decreased knowledge of precautions, and UE functional use, cognitive skills including problem solving and safety awareness, and psychosocial skills including  coping strategies, habits, and routines and behaviors.   IMPAIRMENTS: are limiting patient from ADLs, IADLs, and leisure.   COMORBIDITIES: may have co-morbidities  that affects occupational performance. Patient will benefit from skilled OT to address above impairments and improve overall function.  MODIFICATION OR ASSISTANCE TO COMPLETE EVALUATION: No modification of tasks or assist necessary to complete an evaluation.  OT OCCUPATIONAL PROFILE AND HISTORY: Problem focused assessment: Including review of records relating to presenting problem.  CLINICAL DECISION MAKING: Moderate - several treatment options, min-mod task modification necessary  REHAB POTENTIAL: Good  EVALUATION COMPLEXITY: Low      PLAN:  OT FREQUENCY: 1x/week  OT DURATION: 10  weeks through 06/12/2023 and up to 8 total visits as needed  PLANNED INTERVENTIONS: 97168 OT Re-evaluation, 97535 self care/ADL training, 02889 therapeutic exercise, 97530 therapeutic activity, 97112 neuromuscular re-education, 97140 manual therapy, 97035 ultrasound, 97039 fluidotherapy, 97010 moist heat, 97010 cryotherapy, 97760 Orthotics management and training, 02239 Splinting (initial encounter), [phone] Subsequent splinting/medication, compression bandaging, Dry needling, coping strategies training, patient/family education, and DME and/or AE instructions  RECOMMENDED OTHER SERVICES: None now  CONSULTED AND AGREED WITH PLAN OF CARE: Patient and family member/caregiver  PLAN FOR NEXT SESSION:   Check orthoses as needed, check initial plan of care recommendations and understanding of precautions   Melvenia Ada, OTR/L, CHT 03/31/2023, 4:49 PM

## 2023-04-02 ENCOUNTER — Other Ambulatory Visit: Payer: Self-pay | Admitting: Gynecology

## 2023-04-02 DIAGNOSIS — Z1231 Encounter for screening mammogram for malignant neoplasm of breast: Secondary | ICD-10-CM

## 2023-04-03 ENCOUNTER — Ambulatory Visit (INDEPENDENT_AMBULATORY_CARE_PROVIDER_SITE_OTHER): Payer: Medicare Other

## 2023-04-03 DIAGNOSIS — E538 Deficiency of other specified B group vitamins: Secondary | ICD-10-CM

## 2023-04-03 NOTE — Progress Notes (Signed)
 Patient is in office today for a nurse visit for B12 Injection. Patient Injection was given in the  Right deltoid. Patient tolerated injection well.

## 2023-04-06 NOTE — Therapy (Signed)
 OUTPATIENT OCCUPATIONAL THERAPY TREATMENT NOTE  Patient Name: Karla [name] MRN: [id number] DOB:1951-01-13, 73 y.o., female Today's Date: 04/07/2023  PCP: Jolinda MD REFERRING PROVIDER: Arlinda Buster, MD   END OF SESSION:  OT End of Session - 04/07/23 1519     Visit Number 2    Number of Visits 8    Date for OT Re-Evaluation 06/12/23    Authorization Type Medicare    OT Start Time 1520    OT Stop Time 1552    OT Time Calculation (min) 32 min    Equipment Utilized During Treatment orthotic materials    Activity Tolerance Patient tolerated treatment well;No increased pain;Patient limited by fatigue;Patient limited by pain    Behavior During Therapy [facility] for tasks assessed/performed              Past Medical History:  Diagnosis Date   GERD (gastroesophageal reflux disease)    Thyroid  disease    Past Surgical History:  Procedure Laterality Date   CERVICAL SPINE SURGERY     herniated disc   Patient Active Problem List   Diagnosis Date Noted   Calculus of gallbladder without cholecystitis without obstruction 06/19/2022   Hernia, hiatal 02/09/2019   Laryngopharyngeal reflux (LPR) 08/23/2018   Osteopenia after menopause 08/05/2018   Osteoporosis 09/15/2016   Vitamin D  deficiency 07/31/2016   Menopausal and perimenopausal disorder 12/08/2014   GERD (gastroesophageal reflux disease) 07/25/2013   Hypothyroidism 07/25/2013    ONSET DATE: Oct 2024  REFERRING DIAG: M20.012 (ICD-10-CM) - Mallet finger of left finger(s)   THERAPY DIAG:  Mallet finger of left finger(s)  Localized edema  Muscle weakness (generalized)  Pain in left hand  Stiffness of left hand, not elsewhere classified  Other lack of coordination  Rationale for Evaluation and Treatment: Rehabilitation  PERTINENT HISTORY: Per MD note: left index mallet injury that occurred in October of this year (2024). She was seen by her family practitioner and placed into an AlumaFoam splint, which she  subsequently wore for approximately 8 weeks as instructed. The splint did spanned both the DIP and PIP of the index finger.  She states that she injured her finger playing catch with a football in October 2024.  She had an avulsion fracture to the terminal tendon to the left index finger and resulting mallet finger injury.  She states that she was given an AlumaFoam will splint which went over her entire left index finger that she wore for the better part of 2 months.  Unfortunately she also states removing it frequently to sleep and also to take baths and showers.  Now she presents with a swan-neck deformity in the left index finger, stiffness, pain, swelling and decreased functional ability.   PRECAUTIONS: None ; RED FLAGS: None   WEIGHT BEARING RESTRICTIONS: Yes <5# in Lt hand now   SUBJECTIVE:   SUBJECTIVE STATEMENT:  She states relative motion orthosis needs small adjustment but that her mallet finger orthosis is working well and she is choosing not to take it off and instead keep her hand from getting wet in the shower by using a plastic bag, etc..     PAIN:  Are you having pain?  Nothing significant right now at rest   PATIENT GOALS: To improve the use and mobility of her left nondominant hand  NEXT MD VISIT: As needed    OBJECTIVE: (All objective assessments below are from initial evaluation on: 03/31/23 unless otherwise specified.)   HAND DOMINANCE: Right   ADLs: Overall ADLs: States decreased ability  to grab, hold household objects, pain and difficulty to open containers, perform FMS tasks (manipulate fasteners on clothing), mild bathing problems as well.    FUNCTIONAL OUTCOME MEASURES: Eval: Quick DASH 22.7% impairment today  (Higher % Score  =  More Impairment)     UPPER EXTREMITY ROM     Shoulder to Wrist AROM Left eval  Wrist flexion Within functional limits  Wrist extension Within functional limits  (Blank rows = not tested)   Hand AROM Left eval  Lt 04/07/23  Full Fist Ability (or Gap to Distal Palmar Crease) Unable with the left index finger due to stiffness in a swan-neck position   Thumb Opposition  (Kapandji Scale)  WFL   Index MCP (0-90) Some stiffness and decreased flexion, full extension 0-67  Index PIP (0-100) Stiffness in hyperextension with no significant ability to bend into flexion 0-54  Index DIP (0-70) Drooping about 30 degrees into flexion with inability to correct, also stiff to flex   (Blank rows = not tested)   HAND FUNCTION: Eval: Observed weakness in affected Lt hand, due to stiffness and weakness through the index finger.  Details will be tested after healing phase which is at least 6 to 8 weeks. Grip strength Right: TBD lbs, Left: TBD lbs   COORDINATION: Eval: Observed coordination impairments with affected left hand, as she is unable to effectively use her index finger.  Details will be tested when able after the initial 6 to 8-week healing phase. 9 Hole Peg Test Left: TBD sec (TBD sec is WFL)   SENSATION: Eval:  Light touch intact today, though states occasional tingling in the tip of her index finger  EDEMA:   04/07/23: 6.4cm swelling circumferentially around Lt IF P1 (compared to 6.2cm in other hand)   Eval:  Mildly swollen in left index finger and hand  COGNITION: Eval: Overall cognitive status: WFL for evaluation today   OBSERVATIONS:   Eval: Overt mallet injury that has progressed to a swan-neck deformity now over the course of 2 to 3 months since initial injury.  She is largely nontender and PIP joint is passively flexible into some flexion, approximately 30 to 40 degrees.  DIP joint is able to make full extension passively but hyperextension is somewhat difficult as she is stiff.   TODAY'S TREATMENT:  04/07/23: It appears that her finger is in a nice bit of slight hyperextension at the DIP joint and that there is no significant redness or tenderness or swelling in the index finger now.    OT  starts by adjusting her relative motion orthotic as it got a little tight around her fingers and index finger.  OT also adds padding to it and provides her with extra padding in case this 1 does not stick today as the orthosis got a bit wet during adjustment.    Next we spend the majority of the time discussing self-care and safety, not bending the DIP joint, when to contact us  and return to therapy sooner (for increased swelling, tenderness, redness, pain greater than 5 out of 10, etc.).  She is given our phone number again and told to make an appointment at any time if she is having any exacerbation of issues, otherwise she can return in 5 weeks at which point we will test the stability of her DIP joint.   We discussed doing light exercise for the wrist and hand including trying to make the PIP joint bend but not lifting anything over 5 pounds or doing anything  so forcefully that she feels increased pain to the DIP joint.  She states understanding all directions today, having no more questions.  And tolerating the relative motion orthotic in the mallet orthosis well without any significant pain or pressure.   PATIENT EDUCATION: Education details: See tx section above for details  Person educated: Patient Education method: Verbal Instruction, Teach back, Handouts  Education comprehension: States and demonstrates understanding, Additional Education required    HOME EXERCISE PROGRAM: See tx section above for details    GOALS: Goals reviewed with patient? Yes   SHORT TERM GOALS: (STG required if POC>30 days) Target Date: 03/31/2023  Pt will obtain protective, custom orthotic. Goal status: 03/31/2023 MET     LONG TERM GOALS: Target Date: 06/12/2023  Pt will improve functional ability by decreased impairment per Quick DASH assessment from 22.7% to 15% or better, for better quality of life. Goal status: INITIAL  2.  Pt will improve grip strength in left hand to at least 35 lbs for  functional use at home and in IADLs. (After initial 6 to 8 weeks of healing phase) Goal status: INITIAL  3.  Pt will improve A/ROM in left index finger total active motion to at least 220*, to have functional motion for tasks like reach and grasp.  (After initial 6 to 8 weeks of healing phase) Goal status: INITIAL  4.  Pt will demo/state understanding of initial HEP to improve pain levels and prerequisite motion. (After initial healing phase for 6 to 8 weeks) Goal status: INITIAL  5.  Pt will improve coordination skills in left hand, as seen by within functional limit score on nine-hole peg testing to have increased functional ability to carry out fine motor tasks (fasteners, etc.) and more complex, coordinated IADLs (meal prep, sports, etc.).  (After initial 6 to 8 weeks of healing phase) Goal status: INITIAL    ASSESSMENT:  CLINICAL IMPRESSION: 04/07/23: We will now allow for 5 weeks of healing time.  OT will also discussed with the hand surgeon if he would like to see her first for possible x-rays or if she is fine to attempt motion at 6 weeks post immobilization with OT.  Eval: Patient is a 73 y.o. female who was seen today for occupational therapy evaluation for decreased functional ability in the left nondominant hand as well as stiffness and pain and swelling in the left index finger after a bony mallet injury and improper use of braces that led to now a swan-neck deformity.  She will benefit from outpatient occupational therapy to decrease symptoms and increase quality of life, but it must start with a period of rest and immobilization for 6 to 8 weeks.  She was educated copiously today to not allow the DIP joint to sag into flexion for at least 6 to 8 weeks.    PLAN:  OT FREQUENCY: 1x/week  OT DURATION: 10 weeks through 06/12/2023 and up to 8 total visits as needed  PLANNED INTERVENTIONS: 97168 OT Re-evaluation, 97535 self care/ADL training, 02889 therapeutic exercise, 97530  therapeutic activity, 97112 neuromuscular re-education, 97140 manual therapy, 97035 ultrasound, 97039 fluidotherapy, 97010 moist heat, 97010 cryotherapy, 97760 Orthotics management and training, 02239 Splinting (initial encounter), [phone] Subsequent splinting/medication, compression bandaging, Dry needling, coping strategies training, patient/family education, and DME and/or AE instructions  RECOMMENDED OTHER SERVICES: None now  CONSULTED AND AGREED WITH PLAN OF CARE: Patient and family member/caregiver  PLAN FOR NEXT SESSION:   See back as needed, but at 6-week mark (Feb 18th) attempt mobilization  as long as the hand surgeon is fine with this.   Melvenia Ada, OTR/L, CHT 04/07/2023, 3:57 PM

## 2023-04-07 ENCOUNTER — Encounter: Payer: Self-pay | Admitting: Rehabilitative and Restorative Service Providers"

## 2023-04-07 ENCOUNTER — Ambulatory Visit (INDEPENDENT_AMBULATORY_CARE_PROVIDER_SITE_OTHER): Payer: Medicare Other | Admitting: Rehabilitative and Restorative Service Providers"

## 2023-04-07 DIAGNOSIS — M20012 Mallet finger of left finger(s): Secondary | ICD-10-CM | POA: Diagnosis not present

## 2023-04-07 DIAGNOSIS — M6281 Muscle weakness (generalized): Secondary | ICD-10-CM

## 2023-04-07 DIAGNOSIS — M79642 Pain in left hand: Secondary | ICD-10-CM

## 2023-04-07 DIAGNOSIS — R600 Localized edema: Secondary | ICD-10-CM

## 2023-04-07 DIAGNOSIS — R278 Other lack of coordination: Secondary | ICD-10-CM

## 2023-04-07 DIAGNOSIS — M25642 Stiffness of left hand, not elsewhere classified: Secondary | ICD-10-CM

## 2023-05-04 ENCOUNTER — Ambulatory Visit (INDEPENDENT_AMBULATORY_CARE_PROVIDER_SITE_OTHER): Payer: Medicare Other | Admitting: Family Medicine

## 2023-05-04 DIAGNOSIS — E538 Deficiency of other specified B group vitamins: Secondary | ICD-10-CM | POA: Diagnosis not present

## 2023-05-04 NOTE — Progress Notes (Signed)
 Patient is in office today for a nurse visit for B12 Injection. Patient Injection was given in the  Left deltoid. Patient tolerated injection well.

## 2023-05-13 ENCOUNTER — Encounter: Payer: Medicare Other | Admitting: Rehabilitative and Restorative Service Providers"

## 2023-05-19 NOTE — Therapy (Signed)
 OUTPATIENT OCCUPATIONAL THERAPY TREATMENT NOTE  Patient Name: Karla [name] MRN: [id number] DOB:June 23, 1950, 73 y.o., female Today's Date: 05/19/2023  PCP: Nadine Counts MD REFERRING PROVIDER: Samuella Cota, MD   END OF SESSION:     Past Medical History:  Diagnosis Date   GERD (gastroesophageal reflux disease)    Thyroid disease    Past Surgical History:  Procedure Laterality Date   CERVICAL SPINE SURGERY     herniated disc   Patient Active Problem List   Diagnosis Date Noted   Calculus of gallbladder without cholecystitis without obstruction 06/19/2022   Hernia, hiatal 02/09/2019   Laryngopharyngeal reflux (LPR) 08/23/2018   Osteopenia after menopause 08/05/2018   Osteoporosis 09/15/2016   Vitamin D deficiency 07/31/2016   Menopausal and perimenopausal disorder 12/08/2014   GERD (gastroesophageal reflux disease) 07/25/2013   Hypothyroidism 07/25/2013    ONSET DATE: Oct 2024  REFERRING DIAG: M20.012 (ICD-10-CM) - Mallet finger of left finger(s)   THERAPY DIAG:  No diagnosis found.  Rationale for Evaluation and Treatment: Rehabilitation  PERTINENT HISTORY: Per MD note: "left index mallet injury that occurred in October of this year (2024). She was seen by her family practitioner and placed into an AlumaFoam splint, which she subsequently wore for approximately 8 weeks as instructed. The splint did spanned both the DIP and PIP of the index finger. " She states that she injured her finger playing catch with a football in October 2024.  She had an avulsion fracture to the terminal tendon to the left index finger and resulting mallet finger injury.  She states that she was given an AlumaFoam will splint which went over her entire left index finger that she wore for the better part of 2 months.  Unfortunately she also states removing it frequently to sleep and also to take baths and showers.  Now she presents with a swan-neck deformity in the left index finger, stiffness, pain,  swelling and decreased functional ability.   PRECAUTIONS: None ; RED FLAGS: None   WEIGHT BEARING RESTRICTIONS: Yes <5# in Lt hand now   SUBJECTIVE:   SUBJECTIVE STATEMENT:  She has been immobilized for ~7 weeks now for Lt IF mallet injury. Today she states ***  relative motion orthosis needs small adjustment but that her mallet finger orthosis is working well and she is choosing not to take it off and instead keep her hand from getting wet in the shower by using a plastic bag, etc..     PAIN:  Are you having pain?  *** Nothing significant right now at rest   PATIENT GOALS: To improve the use and mobility of her left nondominant hand  NEXT MD VISIT: As needed    OBJECTIVE: (All objective assessments below are from initial evaluation on: 03/31/23 unless otherwise specified.)   HAND DOMINANCE: Right   ADLs: Overall ADLs: States decreased ability to grab, hold household objects, pain and difficulty to open containers, perform FMS tasks (manipulate fasteners on clothing), mild bathing problems as well.    FUNCTIONAL OUTCOME MEASURES: Eval: Quick DASH 22.7% impairment today  (Higher % Score  =  More Impairment)     UPPER EXTREMITY ROM     Shoulder to Wrist AROM Left eval  Wrist flexion Within functional limits  Wrist extension Within functional limits  (Blank rows = not tested)   Hand AROM Left eval Lt 04/07/23 Lt 05/20/23  Full Fist Ability (or Gap to Distal Palmar Crease) Unable with the left index finger due to stiffness in a swan-neck position  Thumb Opposition  (Kapandji Scale)  WFL    Index MCP (0-90) Some stiffness and decreased flexion, full extension 0-67 0 - ***  Index PIP (0-100) Stiffness in hyperextension with no significant ability to bend into flexion 0-54 0 - ***  Index DIP (0-70) Drooping about 30 degrees into flexion with inability to correct, also stiff to flex    (Blank rows = not tested)   HAND FUNCTION: Eval: Observed weakness in affected Lt  hand, due to stiffness and weakness through the index finger.  Details will be tested after healing phase which is at least 6 to 8 weeks. Grip strength Right: TBD lbs, Left: TBD lbs   COORDINATION: Eval: Observed coordination impairments with affected left hand, as she is unable to effectively use her index finger.  Details will be tested when able after the initial 6 to 8-week healing phase. 9 Hole Peg Test Left: TBD sec (TBD sec is WFL)   SENSATION: Eval:  Light touch intact today, though states occasional tingling in the tip of her index finger  EDEMA:   04/07/23: 6.4cm swelling circumferentially around Lt IF P1 (compared to 6.2cm in other hand)   Eval:  Mildly swollen in left index finger and hand   OBSERVATIONS:   Eval: Overt mallet injury that has progressed to a swan-neck deformity now over the course of 2 to 3 months since initial injury.  She is largely nontender and PIP joint is passively flexible into some flexion, approximately 30 to 40 degrees.  DIP joint is able to make full extension passively but hyperextension is somewhat difficult as she is stiff.   TODAY'S TREATMENT:  05/20/23: *** See back as needed, but at 6-week mark (Feb 18th) attempt mobilization as long as the hand surgeon is fine with this.   04/07/23: It appears that her finger is in a nice bit of slight hyperextension at the DIP joint and that there is no significant redness or tenderness or swelling in the index finger now.    OT starts by adjusting her relative motion orthotic as it got a little tight around her fingers and index finger.  OT also adds padding to it and provides her with extra padding in case this 1 does not stick today as the orthosis got a bit wet during adjustment.    Next we spend the majority of the time discussing self-care and safety, not bending the DIP joint, when to contact us and return to therapy sooner (for increased swelling, tenderness, redness, pain greater than 5 out of 10, etc.).   She is given our phone number again and told to make an appointment at any time if she is having any exacerbation of issues, otherwise she can return in 5 weeks at which point we will test the stability of her DIP joint.   We discussed doing light exercise for the wrist and hand including trying to make the PIP joint bend but not lifting anything over 5 pounds or doing anything so forcefully that she feels increased pain to the DIP joint.  She states understanding all directions today, having no more questions.  And tolerating the relative motion orthotic in the mallet orthosis well without any significant pain or pressure.   PATIENT EDUCATION: Education details: See tx section above for details  Person educated: Patient Education method: Verbal Instruction, Teach back, Handouts  Education comprehension: States and demonstrates understanding, Additional Education required    HOME EXERCISE PROGRAM: See tx section above for details  GOALS: Goals reviewed with patient? Yes   SHORT TERM GOALS: (STG required if POC>30 days) Target Date: 03/31/2023  Pt will obtain protective, custom orthotic. Goal status: 03/31/2023 MET     LONG TERM GOALS: Target Date: 06/12/2023  Pt will improve functional ability by decreased impairment per Quick DASH assessment from 22.7% to 15% or better, for better quality of life. Goal status: INITIAL  2.  Pt will improve grip strength in left hand to at least 35 lbs for functional use at home and in IADLs. (After initial 6 to 8 weeks of healing phase) Goal status: INITIAL  3.  Pt will improve A/ROM in left index finger total active motion to at least 220*, to have functional motion for tasks like reach and grasp.  (After initial 6 to 8 weeks of healing phase) Goal status: INITIAL  4.  Pt will demo/state understanding of initial HEP to improve pain levels and prerequisite motion. (After initial healing phase for 6 to 8 weeks) Goal status: INITIAL  5.  Pt  will improve coordination skills in left hand, as seen by within functional limit score on nine-hole peg testing to have increased functional ability to carry out fine motor tasks (fasteners, etc.) and more complex, coordinated IADLs (meal prep, sports, etc.).  (After initial 6 to 8 weeks of healing phase) Goal status: INITIAL    ASSESSMENT:  CLINICAL IMPRESSION: 05/20/23: ***  04/07/23: We will now allow for 5 weeks of healing time.  OT will also discussed with the hand surgeon if he would like to see her first for possible x-rays or if she is fine to attempt motion at 6 weeks post immobilization with OT.  Eval: Patient is a 73 y.o. female who was seen today for occupational therapy evaluation for decreased functional ability in the left nondominant hand as well as stiffness and pain and swelling in the left index finger after a bony mallet injury and improper use of braces that led to now a swan-neck deformity.  She will benefit from outpatient occupational therapy to decrease symptoms and increase quality of life, but it must start with a period of rest and immobilization for 6 to 8 weeks.  She was educated copiously today to not allow the DIP joint to sag into flexion for at least 6 to 8 weeks.    PLAN:  OT FREQUENCY: 1x/week  OT DURATION: 10 weeks through 06/12/2023 and up to 8 total visits as needed  PLANNED INTERVENTIONS: 97168 OT Re-evaluation, 97535 self care/ADL training, 16109 therapeutic exercise, 97530 therapeutic activity, 97112 neuromuscular re-education, 97140 manual therapy, 97035 ultrasound, 97039 fluidotherapy, 97010 moist heat, 97010 cryotherapy, 97760 Orthotics management and training, 60454 Splinting (initial encounter), [id number] Subsequent splinting/medication, compression bandaging, Dry needling, coping strategies training, patient/family education, and DME and/or AE instructions  RECOMMENDED OTHER SERVICES: None now  CONSULTED AND AGREED WITH PLAN OF CARE: Patient and  family member/caregiver  PLAN FOR NEXT SESSION:   ***   [name], OTR/L, CHT 05/19/2023, 9:13 AM

## 2023-05-20 ENCOUNTER — Encounter: Payer: Self-pay | Admitting: Rehabilitative and Restorative Service Providers"

## 2023-05-20 ENCOUNTER — Ambulatory Visit (INDEPENDENT_AMBULATORY_CARE_PROVIDER_SITE_OTHER): Payer: Medicare Other | Admitting: Rehabilitative and Restorative Service Providers"

## 2023-05-20 DIAGNOSIS — R600 Localized edema: Secondary | ICD-10-CM | POA: Diagnosis not present

## 2023-05-20 DIAGNOSIS — M6281 Muscle weakness (generalized): Secondary | ICD-10-CM

## 2023-05-20 DIAGNOSIS — M20012 Mallet finger of left finger(s): Secondary | ICD-10-CM

## 2023-05-20 DIAGNOSIS — M79642 Pain in left hand: Secondary | ICD-10-CM | POA: Diagnosis not present

## 2023-05-20 DIAGNOSIS — M25642 Stiffness of left hand, not elsewhere classified: Secondary | ICD-10-CM

## 2023-05-20 DIAGNOSIS — R278 Other lack of coordination: Secondary | ICD-10-CM

## 2023-05-26 NOTE — Therapy (Signed)
 OUTPATIENT OCCUPATIONAL THERAPY TREATMENT NOTE  Patient Name: Karla [name] MRN: [id number] DOB:05-31-1950, 73 y.o., female Today's Date: 05/27/2023  PCP: Nadine Counts MD REFERRING PROVIDER: Samuella Cota, MD   END OF SESSION:  OT End of Session - 05/27/23 0938     Visit Number 4    Number of Visits 8    Date for OT Re-Evaluation 06/12/23    Authorization Type Medicare    OT Start Time 4098    OT Stop Time 1004    OT Time Calculation (min) 26 min    Equipment Utilized During Treatment --    Activity Tolerance Patient tolerated treatment well;No increased pain;Patient limited by fatigue;Patient limited by pain    Behavior During Therapy [facility] for tasks assessed/performed                Past Medical History:  Diagnosis Date   GERD (gastroesophageal reflux disease)    Thyroid disease    Past Surgical History:  Procedure Laterality Date   CERVICAL SPINE SURGERY     herniated disc   Patient Active Problem List   Diagnosis Date Noted   Calculus of gallbladder without cholecystitis without obstruction 06/19/2022   Hernia, hiatal 02/09/2019   Laryngopharyngeal reflux (LPR) 08/23/2018   Osteopenia after menopause 08/05/2018   Osteoporosis 09/15/2016   Vitamin D deficiency 07/31/2016   Menopausal and perimenopausal disorder 12/08/2014   GERD (gastroesophageal reflux disease) 07/25/2013   Hypothyroidism 07/25/2013    ONSET DATE: Oct 2024  REFERRING DIAG: M20.012 (ICD-10-CM) - Mallet finger of left finger(s)   THERAPY DIAG:  Localized edema  Muscle weakness (generalized)  Pain in left hand  Stiffness of left hand, not elsewhere classified  Other lack of coordination  Mallet finger of left finger(s)  Rationale for Evaluation and Treatment: Rehabilitation  PERTINENT HISTORY: Per MD note: "left index mallet injury that occurred in October of this year (2024). She was seen by her family practitioner and placed into an AlumaFoam splint, which she subsequently wore  for approximately 8 weeks as instructed. The splint did spanned both the DIP and PIP of the index finger. " She states that she injured her finger playing catch with a football in October 2024.  She had an avulsion fracture to the terminal tendon to the left index finger and resulting mallet finger injury.  She states that she was given an AlumaFoam will splint which went over her entire left index finger that she wore for the better part of 2 months.  Unfortunately she also states removing it frequently to sleep and also to take baths and showers.  Now she presents with a swan-neck deformity in the left index finger, stiffness, pain, swelling and decreased functional ability.   PRECAUTIONS: None ; RED FLAGS: None   WEIGHT BEARING RESTRICTIONS: Yes <5# in Lt hand now   SUBJECTIVE:   SUBJECTIVE STATEMENT: Lt IF mallet injury ~8 weeks post beginning immobilization. Today she states no pain, hand, finger looking a bit straighter now, has been doing exercises 4 times a day and wearing day and night braces.     PAIN:  Are you having pain?   Nothing significant right now at rest   PATIENT GOALS: To improve the use and mobility of her left nondominant hand  NEXT MD VISIT: As needed    OBJECTIVE: (All objective assessments below are from initial evaluation on: 03/31/23 unless otherwise specified.)   HAND DOMINANCE: Right   ADLs: Overall ADLs: States decreased ability to grab, hold household objects, pain and  difficulty to open containers, perform FMS tasks (manipulate fasteners on clothing), mild bathing problems as well.    FUNCTIONAL OUTCOME MEASURES: Eval: Quick DASH 22.7% impairment today  (Higher % Score  =  More Impairment)     UPPER EXTREMITY ROM     Shoulder to Wrist AROM Left eval  Wrist flexion Within functional limits  Wrist extension Within functional limits  (Blank rows = not tested)   Hand AROM Left eval Lt 04/07/23 Lt 05/20/23 Lt 05/27/23  Full Fist Ability (or  Gap to Distal Palmar Crease) Unable with the left index finger due to stiffness in a swan-neck position     Thumb Opposition  (Kapandji Scale)  WFL     Index MCP (0-90) Some stiffness and decreased flexion, full extension 0-67 0 - 90 0-72  Index PIP (0-100) Stiffness in hyperextension with no significant ability to bend into flexion 0-54 0 - 41 0-41  Index DIP (0-70) Drooping about 30 degrees into flexion with inability to correct, also stiff to flex  (-14) -36 (-7) - 29  (Blank rows = not tested)   HAND FUNCTION: Eval: Observed weakness in affected Lt hand, due to stiffness and weakness through the index finger.  Details will be tested after healing phase which is at least 6 to 8 weeks. Grip strength Right: TBD lbs, Left: TBD lbs   COORDINATION: Eval: Observed coordination impairments with affected left hand, as she is unable to effectively use her index finger.  Details will be tested when able after the initial 6 to 8-week healing phase. 9 Hole Peg Test Left: TBD sec (TBD sec is WFL)   SENSATION: Eval:  Light touch intact today, though states occasional tingling in the tip of her index finger  EDEMA:   04/07/23: 6.4cm swelling circumferentially around Lt IF P1 (compared to 6.2cm in other hand)   Eval:  Mildly swollen in left index finger and hand   OBSERVATIONS:   05/20/2023: Her orthosis looks to have been slipping down her finger somewhat allowing a mild bend at the DIP joint, unfortunately.  Fortunately, her lag in extension has decreased by half, she is able to bend the DIP joint and extend it again, with no pain.  She has some mild swelling from wearing brace for so long.  Looks like a mild angulation at the DIP joint as well that could be premorbid and related to underlying arthritis.  Tolerating active range of motion now.  Eval: Overt mallet injury that has progressed to a swan-neck deformity now over the course of 2 to 3 months since initial injury.  She is largely nontender  and PIP joint is passively flexible into some flexion, approximately 30 to 40 degrees.  DIP joint is able to make full extension passively but hyperextension is somewhat difficult as she is stiff.   TODAY'S TREATMENT:  05/27/23: She performs active range of motion for new measures which shows excellent DIP extension now which is improving.  Flexion was a bit stiffer, so OT decides to upgrade her home exercise program in terms of both frequency and also starting to wean from daytime orthosis 3-4 times a day for 15 up to 30 minutes for resting or light activities.  New exercises were educated on and she demonstrates back for understanding and are listed below.  She was reminded to watch herself for a drooping DIP-and in that case to "slow down" and wear daytime orthosis more often again.  She was told to refrain from gripping or pushing  more than 5 pounds still.  She states understanding all directions and leaves without pain.   Exercises - BACK KNUCKLE STRETCHES   - 4-6 x daily - 3 reps - 15 sec hold - Seated Finger PIP Flexion PROM  - 4-6 x daily - 3 reps - 15 sec hold - Tendon Glides  - 4-6 x daily - 5 reps - 2-3 seconds hold - DIP Flexion Extension AROM  - 4-6 x daily - 5-15 reps - 3-5 sec hold    PATIENT EDUCATION: Education details: See tx section above for details  Person educated: Patient Education method: Engineer, structural, Teach back, Handouts  Education comprehension: States and demonstrates understanding, Additional Education required    HOME EXERCISE PROGRAM: Access Code: [email or web address] URL: [email or web address] Date: 05/27/2023 Prepared by: [name]   GOALS: Goals reviewed with patient? Yes   SHORT TERM GOALS: (STG required if POC>30 days) Target Date: 03/31/2023  Pt will obtain protective, custom orthotic. Goal status: 03/31/2023 MET     LONG TERM GOALS: Target Date: 06/12/2023  Pt will improve functional ability by decreased impairment per Quick DASH  assessment from 22.7% to 15% or better, for better quality of life. Goal status: INITIAL  2.  Pt will improve grip strength in left hand to at least 35 lbs for functional use at home and in IADLs. (After initial 6 to 8 weeks of healing phase) Goal status: INITIAL  3.  Pt will improve A/ROM in left index finger total active motion to at least 220*, to have functional motion for tasks like reach and grasp.  (After initial 6 to 8 weeks of healing phase) Goal status: INITIAL  4.  Pt will demo/state understanding of initial HEP to improve pain levels and prerequisite motion. (After initial healing phase for 6 to 8 weeks) Goal status: INITIAL  5.  Pt will improve coordination skills in left hand, as seen by within functional limit score on nine-hole peg testing to have increased functional ability to carry out fine motor tasks (fasteners, etc.) and more complex, coordinated IADLs (meal prep, sports, etc.).  (After initial 6 to 8 weeks of healing phase) Goal status: INITIAL    ASSESSMENT:  CLINICAL IMPRESSION: 05/26/33: Now DIP joint is even straighter which is excellent.  Some stiffness was building in flexion, so we will add slight emphasis for that today.  Carry on   PLAN:  OT FREQUENCY: 1x/week  OT DURATION: 10 weeks through 06/12/2023 and up to 8 total visits as needed  PLANNED INTERVENTIONS: 97168 OT Re-evaluation, 97535 self care/ADL training, 21308 therapeutic exercise, 97530 therapeutic activity, 97112 neuromuscular re-education, 97140 manual therapy, 97035 ultrasound, 97039 fluidotherapy, 97010 moist heat, 97010 cryotherapy, 97760 Orthotics management and training, 65784 Splinting (initial encounter), [id number] Subsequent splinting/medication, compression bandaging, Dry needling, coping strategies training, patient/family education, and DME and/or AE instructions  RECOMMENDED OTHER SERVICES: None now  CONSULTED AND AGREED WITH PLAN OF CARE: Patient and family member/caregiver  PLAN FOR  NEXT SESSION:   Continue to monitor range of motion, tolerance to exercises.  If extension lag is still less than 10 degrees, initiate gentle PROM at the DIP joint   [name], OTR/L, CHT 05/27/2023, 12:06 PM

## 2023-05-27 ENCOUNTER — Encounter: Payer: Self-pay | Admitting: Rehabilitative and Restorative Service Providers"

## 2023-05-27 ENCOUNTER — Ambulatory Visit (INDEPENDENT_AMBULATORY_CARE_PROVIDER_SITE_OTHER): Payer: Medicare Other | Admitting: Rehabilitative and Restorative Service Providers"

## 2023-05-27 DIAGNOSIS — M6281 Muscle weakness (generalized): Secondary | ICD-10-CM

## 2023-05-27 DIAGNOSIS — R278 Other lack of coordination: Secondary | ICD-10-CM

## 2023-05-27 DIAGNOSIS — R600 Localized edema: Secondary | ICD-10-CM | POA: Diagnosis not present

## 2023-05-27 DIAGNOSIS — M20012 Mallet finger of left finger(s): Secondary | ICD-10-CM

## 2023-05-27 DIAGNOSIS — M25642 Stiffness of left hand, not elsewhere classified: Secondary | ICD-10-CM

## 2023-05-27 DIAGNOSIS — M79642 Pain in left hand: Secondary | ICD-10-CM | POA: Diagnosis not present

## 2023-06-01 ENCOUNTER — Ambulatory Visit (INDEPENDENT_AMBULATORY_CARE_PROVIDER_SITE_OTHER): Payer: Medicare Other

## 2023-06-01 DIAGNOSIS — E538 Deficiency of other specified B group vitamins: Secondary | ICD-10-CM | POA: Diagnosis not present

## 2023-06-01 NOTE — Progress Notes (Signed)
 Patient is in office today for a nurse visit for B12 Injection. Patient Injection was given in the  Right deltoid. Patient tolerated injection well.

## 2023-06-02 NOTE — Therapy (Signed)
 OUTPATIENT OCCUPATIONAL THERAPY TREATMENT NOTE  Patient Name: Tove Wideman MRN: [id number] DOB:[birth year], 73 y.o., female Today's Date: 06/03/2023  PCP: Nadine Counts MD REFERRING PROVIDER: Samuella Cota, MD   END OF SESSION:  OT End of Session - 06/03/23 1018     Visit Number 5    Number of Visits 8    Date for OT Re-Evaluation 06/12/23    Authorization Type Medicare    OT Start Time 1018    OT Stop Time 1100    OT Time Calculation (min) 42 min    Activity Tolerance Patient tolerated treatment well;No increased pain;Patient limited by fatigue;Patient limited by pain    Behavior During Therapy [facility] for tasks assessed/performed              Past Medical History:  Diagnosis Date   GERD (gastroesophageal reflux disease)    Thyroid disease    Past Surgical History:  Procedure Laterality Date   CERVICAL SPINE SURGERY     herniated disc   Patient Active Problem List   Diagnosis Date Noted   Calculus of gallbladder without cholecystitis without obstruction 06/19/2022   Hernia, hiatal 02/09/2019   Laryngopharyngeal reflux (LPR) 08/23/2018   Osteopenia after menopause 08/05/2018   Osteoporosis 09/15/2016   Vitamin D deficiency 07/31/2016   Menopausal and perimenopausal disorder 12/08/2014   GERD (gastroesophageal reflux disease) 07/25/2013   Hypothyroidism 07/25/2013    ONSET DATE: Oct 2024  REFERRING DIAG: M20.012 (ICD-10-CM) - Mallet finger of left finger(s)   THERAPY DIAG:  Localized edema  Muscle weakness (generalized)  Stiffness of left hand, not elsewhere classified  Other lack of coordination  Rationale for Evaluation and Treatment: Rehabilitation  PERTINENT HISTORY: Per MD note: "left index mallet injury that occurred in October of this year (2024). She was seen by her family practitioner and placed into an AlumaFoam splint, which she subsequently wore for approximately 8 weeks as instructed. The splint did spanned both the DIP and PIP of the index  finger. " She states that she injured her finger playing catch with a football in October 2024.  She had an avulsion fracture to the terminal tendon to the left index finger and resulting mallet finger injury.  She states that she was given an AlumaFoam will splint which went over her entire left index finger that she wore for the better part of 2 months.  Unfortunately she also states removing it frequently to sleep and also to take baths and showers.  Now she presents with a swan-neck deformity in the left index finger, stiffness, pain, swelling and decreased functional ability.   PRECAUTIONS: None ; RED FLAGS: None   WEIGHT BEARING RESTRICTIONS: Yes <5# in Lt hand now   SUBJECTIVE:   SUBJECTIVE STATEMENT: Lt IF mallet injury ~9 weeks post beginning immobilization. Today she states feeling like her finger is a bit straighter now, still a little swollen at the DIP joint.  No pain she has not had a problem doing her home exercise program      PAIN:  Are you having pain?   Nothing significant right now at rest   PATIENT GOALS: To improve the use and mobility of her left nondominant hand  NEXT MD VISIT: As needed    OBJECTIVE: (All objective assessments below are from initial evaluation on: 03/31/23 unless otherwise specified.)   HAND DOMINANCE: Right   ADLs: Overall ADLs: States decreased ability to grab, hold household objects, pain and difficulty to open containers, perform FMS tasks (manipulate fasteners on clothing), mild  bathing problems as well.    FUNCTIONAL OUTCOME MEASURES: Eval: Quick DASH 22.7% impairment today  (Higher % Score  =  More Impairment)     UPPER EXTREMITY ROM     Shoulder to Wrist AROM Left eval  Wrist flexion Within functional limits  Wrist extension Within functional limits  (Blank rows = not tested)   Hand AROM Left eval Lt 04/07/23 Lt 05/20/23 Lt 05/27/23 Lt 06/03/23  Full Fist Ability (or Gap to Distal Palmar Crease) Unable with the left  index finger due to stiffness in a swan-neck position      Thumb Opposition  (Kapandji Scale)  WFL      Index MCP (0-90) Some stiffness and decreased flexion, full extension 0-67 0 - 90 0-72 0 - 74  Index PIP (0-100) Stiffness in hyperextension with no significant ability to bend into flexion 0-54 0 - 41 0-41 0 - 50  Index DIP (0-70) Drooping about 30 degrees into flexion with inability to correct, also stiff to flex  (-14) -36 (-7) - 29 (-5) - 38  (Blank rows = not tested)   HAND FUNCTION: Eval: Observed weakness in affected Lt hand, due to stiffness and weakness through the index finger.  Details will be tested after healing phase which is at least 6 to 8 weeks. Grip strength Right: TBD lbs, Left: TBD lbs   COORDINATION: Eval: Observed coordination impairments with affected left hand, as she is unable to effectively use her index finger.  Details will be tested when able after the initial 6 to 8-week healing phase. 9 Hole Peg Test Left: TBD sec (TBD sec is WFL)   SENSATION: Eval:  Light touch intact today, though states occasional tingling in the tip of her index finger  EDEMA:   04/07/23: 6.4cm swelling circumferentially around Lt IF P1 (compared to 6.2cm in other hand)   Eval:  Mildly swollen in left index finger and hand   OBSERVATIONS:   05/20/2023: Her orthosis looks to have been slipping down her finger somewhat allowing a mild bend at the DIP joint, unfortunately.  Fortunately, her lag in extension has decreased by half, she is able to bend the DIP joint and extend it again, with no pain.  She has some mild swelling from wearing brace for so long.  Looks like a mild angulation at the DIP joint as well that could be premorbid and related to underlying arthritis.  Tolerating active range of motion now.  Eval: Overt mallet injury that has progressed to a swan-neck deformity now over the course of 2 to 3 months since initial injury.  She is largely nontender and PIP joint is passively  flexible into some flexion, approximately 30 to 40 degrees.  DIP joint is able to make full extension passively but hyperextension is somewhat difficult as she is stiff.   TODAY'S TREATMENT:  06/03/23: She performs active range of motion for review of exercise as well as measures which shows better flexion of the index finger now, and better extension as well.  Due to this, we will continue to wean out of the daytime orthosis to allow more natural flexion and use of the hand, and also add DIP joint gentle flexion stretch.  She was highly encouraged to keep doing reverse blocking exercises to extend the DIP joint during the day.  She had some trouble understanding some of these exercises so they were reviewed with her.  She was also given the following information as bolded below" to help her guide  this process through the remainder of her rehab.  If she felt comfortable she could discharge to home exercise program, but she did not except this as she feels a bit nervous and would rather follow-up in therapy 1 time a week.  Again for safety/self-care she should do no strenuous or strong gripping pushing or pulling with the left hand yet.   Exercises - BACK KNUCKLE STRETCHES   - 4-6 x daily - 3 reps - 15 sec hold - Seated Finger PIP Flexion PROM  - 4-6 x daily - 3 reps - 15 sec hold - Hand PROM DIP Flexion   - 4-6 x daily - 3-5 reps - 15 sec hold - Tendon Glides  - 4-6 x daily - 5 reps - 2-3 seconds hold  "06/03/23:   This week:   Adding tip of finger stretch over edge of table  3-5 x 15 sec light stretches Also continue weaning from day brace- now 4-8? X day for 20-35 mins  3/17- NEXT WEEK-  Adding whole finger stretches  Continue weaning 6-8,  - 1 hour   3/24 - the following week  Add light hand strength (squeeze a towel 5-10 x for 5-10 sec)  Try to leave off day brace for everything except very heavy or very strong grip   4/1- Live your life   Continue wearing night brace for 2-3  months as needed to correct "droop" as needed.   Don't let finger stick out straight!!!"    PATIENT EDUCATION: Education details: See tx section above for details  Person educated: Patient Education method: Verbal Instruction, Teach back, Handouts  Education comprehension: States and demonstrates understanding, Additional Education required    HOME EXERCISE PROGRAM: Access Code: [email or web address] URL: [email or web address] Date: 05/27/2023 Prepared by: [name]   GOALS: Goals reviewed with patient? Yes   SHORT TERM GOALS: (STG required if POC>30 days) Target Date: 03/31/2023  Pt will obtain protective, custom orthotic. Goal status: 03/31/2023 MET     LONG TERM GOALS: Target Date: 06/12/2023  Pt will improve functional ability by decreased impairment per Quick DASH assessment from 22.7% to 15% or better, for better quality of life. Goal status: INITIAL  2.  Pt will improve grip strength in left hand to at least 35 lbs for functional use at home and in IADLs. (After initial 6 to 8 weeks of healing phase) Goal status: INITIAL  3.  Pt will improve A/ROM in left index finger total active motion to at least 220*, to have functional motion for tasks like reach and grasp.  (After initial 6 to 8 weeks of healing phase) Goal status: INITIAL  4.  Pt will demo/state understanding of initial HEP to improve pain levels and prerequisite motion. (After initial healing phase for 6 to 8 weeks) Goal status: INITIAL  5.  Pt will improve coordination skills in left hand, as seen by within functional limit score on nine-hole peg testing to have increased functional ability to carry out fine motor tasks (fasteners, etc.) and more complex, coordinated IADLs (meal prep, sports, etc.).  (After initial 6 to 8 weeks of healing phase) Goal status: INITIAL    ASSESSMENT:  CLINICAL IMPRESSION: 06/03/23: Finger active range of motion continues to improve, no significant extensor lag or new  swelling though swelling does continue.  OT is hopeful that weaning from orthosis more will help decrease swelling.  05/26/33: Now DIP joint is even straighter which is excellent.  Some stiffness was building in flexion, so we will  add slight emphasis for that today.  Carry on   PLAN:  OT FREQUENCY: 1x/week  OT DURATION: 10 weeks through 06/12/2023 and up to 8 total visits as needed  PLANNED INTERVENTIONS: 97168 OT Re-evaluation, 97535 self care/ADL training, 09811 therapeutic exercise, 97530 therapeutic activity, 97112 neuromuscular re-education, 97140 manual therapy, 97035 ultrasound, 97039 fluidotherapy, 97010 moist heat, 97010 cryotherapy, 97760 Orthotics management and training, 91478 Splinting (initial encounter), [phone] Subsequent splinting/medication, compression bandaging, Dry needling, coping strategies training, patient/family education, and DME and/or AE instructions  RECOMMENDED OTHER SERVICES: None now  CONSULTED AND AGREED WITH PLAN OF CARE: Patient and family member/caregiver  PLAN FOR NEXT SESSION:   Check new DIP joint stretch, check range of motion, add whole finger composite flexion stretches as tolerated.   [name], OTR/L, CHT 06/03/2023, 11:35 AM

## 2023-06-03 ENCOUNTER — Encounter: Payer: Self-pay | Admitting: Rehabilitative and Restorative Service Providers"

## 2023-06-03 ENCOUNTER — Ambulatory Visit (INDEPENDENT_AMBULATORY_CARE_PROVIDER_SITE_OTHER): Payer: Medicare Other | Admitting: Rehabilitative and Restorative Service Providers"

## 2023-06-03 DIAGNOSIS — R278 Other lack of coordination: Secondary | ICD-10-CM | POA: Diagnosis not present

## 2023-06-03 DIAGNOSIS — M25642 Stiffness of left hand, not elsewhere classified: Secondary | ICD-10-CM

## 2023-06-03 DIAGNOSIS — M6281 Muscle weakness (generalized): Secondary | ICD-10-CM | POA: Diagnosis not present

## 2023-06-03 DIAGNOSIS — R600 Localized edema: Secondary | ICD-10-CM

## 2023-06-09 NOTE — Therapy (Signed)
 OUTPATIENT OCCUPATIONAL THERAPY TREATMENT NOTE  Patient Name: Karla [name] MRN: [id number] DOB:[birth year], 73 y.o., female Today's Date: 06/10/2023  PCP: Nadine Counts MD REFERRING PROVIDER: Samuella Cota, MD   END OF SESSION:  OT End of Session - 06/10/23 1015     Visit Number 6    Number of Visits 8    Date for OT Re-Evaluation 06/12/23    Authorization Type Medicare    OT Start Time 1015    OT Stop Time 1053    OT Time Calculation (min) 38 min    Equipment Utilized During Treatment yellow t-putty    Activity Tolerance Patient tolerated treatment well;No increased pain;Patient limited by fatigue    Behavior During Therapy [facility] for tasks assessed/performed               Past Medical History:  Diagnosis Date   GERD (gastroesophageal reflux disease)    Thyroid disease    Past Surgical History:  Procedure Laterality Date   CERVICAL SPINE SURGERY     herniated disc   Patient Active Problem List   Diagnosis Date Noted   Calculus of gallbladder without cholecystitis without obstruction 06/19/2022   Hernia, hiatal 02/09/2019   Laryngopharyngeal reflux (LPR) 08/23/2018   Osteopenia after menopause 08/05/2018   Osteoporosis 09/15/2016   Vitamin D deficiency 07/31/2016   Menopausal and perimenopausal disorder 12/08/2014   GERD (gastroesophageal reflux disease) 07/25/2013   Hypothyroidism 07/25/2013    ONSET DATE: Oct 2024  REFERRING DIAG: M20.012 (ICD-10-CM) - Mallet finger of left finger(s)   THERAPY DIAG:  Localized edema  Stiffness of left hand, not elsewhere classified  Other lack of coordination  Mallet finger of left finger(s)  Pain in left hand  Muscle weakness (generalized)  Rationale for Evaluation and Treatment: Rehabilitation  PERTINENT HISTORY: Per MD note: "left index mallet injury that occurred in October of this year (2024). She was seen by her family practitioner and placed into an AlumaFoam splint, which she subsequently wore for  approximately 8 weeks as instructed. The splint did spanned both the DIP and PIP of the index finger. " She states that she injured her finger playing catch with a football in October 2024.  She had an avulsion fracture to the terminal tendon to the left index finger and resulting mallet finger injury.  She states that she was given an AlumaFoam will splint which went over her entire left index finger that she wore for the better part of 2 months.  Unfortunately she also states removing it frequently to sleep and also to take baths and showers.  Now she presents with a swan-neck deformity in the left index finger, stiffness, pain, swelling and decreased functional ability.   PRECAUTIONS: None ; RED FLAGS: None   WEIGHT BEARING RESTRICTIONS: Yes <5# in Lt hand now   SUBJECTIVE:   SUBJECTIVE STATEMENT: Lt IF mallet injury ~10 weeks post beginning immobilization. Today she states doing well with new stretches, no pain, only mild tenderness in the day when weaning the orthosis at times.    PAIN:  Are you having pain?   Nothing significant right now at rest   PATIENT GOALS: To improve the use and mobility of her left nondominant hand  NEXT MD VISIT: As needed    OBJECTIVE: (All objective assessments below are from initial evaluation on: 03/31/23 unless otherwise specified.)   HAND DOMINANCE: Right   ADLs: Overall ADLs: States decreased ability to grab, hold household objects, pain and difficulty to open containers, perform FMS tasks (manipulate  fasteners on clothing), mild bathing problems as well.    FUNCTIONAL OUTCOME MEASURES: Eval: Quick DASH 22.7% impairment today  (Higher % Score  =  More Impairment)     UPPER EXTREMITY ROM     Shoulder to Wrist AROM Left eval  Wrist flexion Within functional limits  Wrist extension Within functional limits  (Blank rows = not tested)   Hand AROM Left eval Lt 04/07/23 Lt 05/20/23 Lt 05/27/23 Lt 06/03/23 Lt 06/10/23  Full Fist Ability (or  Gap to Distal Palmar Crease) Unable with the left index finger due to stiffness in a swan-neck position       Thumb Opposition  (Kapandji Scale)  WFL       Index MCP (0-90) Some stiffness and decreased flexion, full extension 0-67 0 - 90 0-72 0 - 74 0 - 99  Index PIP (0-100) Stiffness in hyperextension with no significant ability to bend into flexion 0-54 0 - 41 0-41 0 - 50 0 - 64  Index DIP (0-70) Drooping about 30 degrees into flexion with inability to correct, also stiff to flex  (-14) -36 (-7) - 29 (-5) - 38 (-5) - 36  (Blank rows = not tested)   HAND FUNCTION: 06/10/23:   Rt tip pinch: Lt 0.5#; Rt: 9#   Eval: Observed weakness in affected Lt hand, due to stiffness and weakness through the index finger.  Details will be tested after healing phase which is at least 6 to 8 weeks. Grip strength Right: TBD lbs, Left: TBD lbs   COORDINATION: 06/10/23: 9 Hole Peg Test Rt: 20 sec; Left: 25.8 sec (TBD sec is WFL)   Eval: Observed coordination impairments with affected left hand, as she is unable to effectively use her index finger.  Details will be tested when able after the initial 6 to 8-week healing phase.   SENSATION: Eval:  Light touch intact today, though states occasional tingling in the tip of her index finger  EDEMA:   04/07/23: 6.4cm swelling circumferentially around Lt IF P1 (compared to 6.2cm in other hand)   Eval:  Mildly swollen in left index finger and hand   OBSERVATIONS:   05/20/2023: Her orthosis looks to have been slipping down her finger somewhat allowing a mild bend at the DIP joint, unfortunately.  Fortunately, her lag in extension has decreased by half, she is able to bend the DIP joint and extend it again, with no pain.  She has some mild swelling from wearing brace for so long.  Looks like a mild angulation at the DIP joint as well that could be premorbid and related to underlying arthritis.  Tolerating active range of motion now.  Eval: Overt mallet injury that has  progressed to a swan-neck deformity now over the course of 2 to 3 months since initial injury.  She is largely nontender and PIP joint is passively flexible into some flexion, approximately 30 to 40 degrees.  DIP joint is able to make full extension passively but hyperextension is somewhat difficult as she is stiff.   TODAY'S TREATMENT:  06/10/23: She performs active range of motion for exercise as well as new measures that shows continued progress with total finger flexion, and maintained good DIP joint extension.  Due to that and low pain, we advanced to several new stretch techniques including whole composite finger flexion stretches and even light therapy putty pinching today to help her stop her habit of avoiding using the index finger with functional activities.  This was noted today when  she performed the nine-hole peg test.  We discussed self-care/safety again including wearing the orthosis in the day but weaning for light functional tasks and trying not to exclude the index finger.  She states that her night extension brace is still working well.  Each new exercise was demonstrated and she performed back for understanding, leaves in no pain doing well.   Exercises - HOOK Stretch  - 4-6 x daily - 3-5 reps - 15-20 sec hold - Seated Finger Composite Flexion Stretch  - 4-6 x daily - 3-5 reps - 15 hold - Hand AROM DIP Blocking  - 4-6 x daily - 10 reps - 2 seconds hold - Tendon Glides  - 4-6 x daily - 5 reps - 2-3 seconds hold - Thumb Opposition with Putty  - 2-3 x daily - 5 reps - 5- 10 sec hold   "06/03/23:   This week:   Adding tip of finger stretch over edge of table  3-5 x 15 sec light stretches Also continue weaning from day brace- now 4-8? X day for 20-35 mins  3/17- NEXT WEEK-  Adding whole finger stretches  Continue weaning 6-8,  - 1 hour   3/24 - the following week  Add light hand strength (squeeze a towel 5-10 x for 5-10 sec)  Try to leave off day brace for everything except  very heavy or very strong grip   4/1- Live your life   Continue wearing night brace for 2-3 months as needed to correct "droop" as needed.   Don't let finger stick out straight!!!"    PATIENT EDUCATION: Education details: See tx section above for details  Person educated: Patient Education method: Verbal Instruction, Teach back, Handouts  Education comprehension: States and demonstrates understanding, Additional Education required    HOME EXERCISE PROGRAM: Access Code: [email or web address] URL: [email or web address] Date: 05/27/2023 Prepared by: [name]   GOALS: Goals reviewed with patient? Yes   SHORT TERM GOALS: (STG required if POC>30 days) Target Date: 03/31/2023  Pt will obtain protective, custom orthotic. Goal status: 03/31/2023 MET     LONG TERM GOALS: Target Date: 06/12/2023  Pt will improve functional ability by decreased impairment per Quick DASH assessment from 22.7% to 15% or better, for better quality of life. Goal status: INITIAL  2.  Pt will improve grip strength in left hand to at least 35 lbs for functional use at home and in IADLs. (After initial 6 to 8 weeks of healing phase) Goal status: INITIAL  3.  Pt will improve A/ROM in left index finger total active motion to at least 220*, to have functional motion for tasks like reach and grasp.  (After initial 6 to 8 weeks of healing phase) Goal status: INITIAL  4.  Pt will demo/state understanding of initial HEP to improve pain levels and prerequisite motion. (After initial healing phase for 6 to 8 weeks) Goal status: INITIAL  5.  Pt will improve coordination skills in left hand, as seen by within functional limit score on nine-hole peg testing to have increased functional ability to carry out fine motor tasks (fasteners, etc.) and more complex, coordinated IADLs (meal prep, sports, etc.).  (After initial 6 to 8 weeks of healing phase) Goal status: INITIAL    ASSESSMENT:  CLINICAL  IMPRESSION: 06/10/23: Now tolerating light therapy putty pinch as well as whole finger stretches.  Has some lingering habit of excluding index finger from activities, and hopefully new putty activity will help diminish this protective behavior.  06/03/23: Finger active  range of motion continues to improve, no significant extensor lag or new swelling though swelling does continue.  OT is hopeful that weaning from orthosis more will help decrease swelling.  05/26/33: Now DIP joint is even straighter which is excellent.  Some stiffness was building in flexion, so we will add slight emphasis for that today.  Carry on   PLAN:  OT FREQUENCY: 1x/week  OT DURATION: 10 weeks through 06/12/2023 and up to 8 total visits as needed  PLANNED INTERVENTIONS: 97168 OT Re-evaluation, 97535 self care/ADL training, 29528 therapeutic exercise, 97530 therapeutic activity, 97112 neuromuscular re-education, 97140 manual therapy, 97035 ultrasound, 97039 fluidotherapy, 97010 moist heat, 97010 cryotherapy, 97760 Orthotics management and training, 41324 Splinting (initial encounter), [phone] Subsequent splinting/medication, compression bandaging, Dry needling, coping strategies training, patient/family education, and DME and/or AE instructions  CONSULTED AND AGREED WITH PLAN OF CARE: Patient and family member/caregiver  PLAN FOR NEXT SESSION:   Check tip pinch, total active motion, try to completely wean from daytime orthosis, advance as tolerated.   [name], OTR/L, CHT 06/10/2023, 10:58 AM

## 2023-06-10 ENCOUNTER — Encounter: Payer: Self-pay | Admitting: Rehabilitative and Restorative Service Providers"

## 2023-06-10 ENCOUNTER — Ambulatory Visit (INDEPENDENT_AMBULATORY_CARE_PROVIDER_SITE_OTHER): Payer: Medicare Other | Admitting: Rehabilitative and Restorative Service Providers"

## 2023-06-10 DIAGNOSIS — M6281 Muscle weakness (generalized): Secondary | ICD-10-CM

## 2023-06-10 DIAGNOSIS — R278 Other lack of coordination: Secondary | ICD-10-CM | POA: Diagnosis not present

## 2023-06-10 DIAGNOSIS — R600 Localized edema: Secondary | ICD-10-CM

## 2023-06-10 DIAGNOSIS — M25642 Stiffness of left hand, not elsewhere classified: Secondary | ICD-10-CM | POA: Diagnosis not present

## 2023-06-10 DIAGNOSIS — M79642 Pain in left hand: Secondary | ICD-10-CM

## 2023-06-10 DIAGNOSIS — M20012 Mallet finger of left finger(s): Secondary | ICD-10-CM

## 2023-06-11 ENCOUNTER — Ambulatory Visit
Admission: RE | Admit: 2023-06-11 | Discharge: 2023-06-11 | Disposition: A | Discharge status: Home / Self Care | Payer: Medicare Other | Source: Ambulatory Visit | Attending: Gynecology | Admitting: Gynecology

## 2023-06-11 DIAGNOSIS — Z1231 Encounter for screening mammogram for malignant neoplasm of breast: Secondary | ICD-10-CM

## 2023-06-17 NOTE — Therapy (Signed)
 OUTPATIENT OCCUPATIONAL THERAPY TREATMENT & progress NOTE  Patient Name: Karla [name] MRN: [id number] DOB:07/06/1950, 73 y.o., female Today's Date: 06/19/2023  PCP: Nadine Counts MD REFERRING PROVIDER: Samuella Cota, MD        OT PROGRESS NOTE  CLINICAL IMPRESSION: 06/19/23: We did a progress note today and she still has some stiffness and weakness, likely due to apprehension for weaning orthosis and getting back into all activities.  She is still seen leaving her index finger extended sometimes with activities.  We discussed this, upgrading her exercises, and decided that she needs 1-2 additional visits to ensure her recovery.   PLAN:  OT FREQUENCY: 1x/week  OT DURATION: Up to 3 additional weeks from 06/12/2023 through/01/2024 and up to 9 total visits as needed         END OF SESSION:  OT End of Session - 06/19/23 1007     Visit Number 7    Number of Visits 9    Date for OT Re-Evaluation 07/03/23    Authorization Type Medicare    OT Start Time 1011    OT Stop Time 1057    OT Time Calculation (min) 46 min    Activity Tolerance Patient tolerated treatment well;No increased pain;Patient limited by fatigue    Behavior During Therapy [facility] for tasks assessed/performed;Anxious             Past Medical History:  Diagnosis Date   GERD (gastroesophageal reflux disease)    Thyroid disease    Past Surgical History:  Procedure Laterality Date   CERVICAL SPINE SURGERY     herniated disc   Patient Active Problem List   Diagnosis Date Noted   Calculus of gallbladder without cholecystitis without obstruction 06/19/2022   Hernia, hiatal 02/09/2019   Laryngopharyngeal reflux (LPR) 08/23/2018   Osteopenia after menopause 08/05/2018   Osteoporosis 09/15/2016   Vitamin D deficiency 07/31/2016   Menopausal and perimenopausal disorder 12/08/2014   GERD (gastroesophageal reflux disease) 07/25/2013   Hypothyroidism 07/25/2013    ONSET DATE: Oct 2024  REFERRING DIAG:  M20.012 (ICD-10-CM) - Mallet finger of left finger(s)   THERAPY DIAG:  Localized edema  Stiffness of left hand, not elsewhere classified  Other lack of coordination  Mallet finger of left finger(s)  Pain in left hand  Muscle weakness (generalized)  Rationale for Evaluation and Treatment: Rehabilitation  PERTINENT HISTORY: Per MD note: "left index mallet injury that occurred in October of this year (2024). She was seen by her family practitioner and placed into an AlumaFoam splint, which she subsequently wore for approximately 8 weeks as instructed. The splint did spanned both the DIP and PIP of the index finger. " She states that she injured her finger playing catch with a football in October 2024.  She had an avulsion fracture to the terminal tendon to the left index finger and resulting mallet finger injury.  She states that she was given an AlumaFoam will splint which went over her entire left index finger that she wore for the better part of 2 months.  Unfortunately she also states removing it frequently to sleep and also to take baths and showers.  Now she presents with a swan-neck deformity in the left index finger, stiffness, pain, swelling and decreased functional ability.   PRECAUTIONS: None ; RED FLAGS: None   WEIGHT BEARING RESTRICTIONS: Yes <5# in Lt hand now   SUBJECTIVE:   SUBJECTIVE STATEMENT: Lt IF mallet injury ~11 weeks post beginning immobilization. Today she states feeling dependent on her day brace,  and some exercises hurt her (hook stretch).     PAIN:  Are you having pain?   Nothing significant right now at rest   PATIENT GOALS: To improve the use and mobility of her left nondominant hand  NEXT MD VISIT: As needed    OBJECTIVE: (All objective assessments below are from initial evaluation on: 03/31/23 unless otherwise specified.)   HAND DOMINANCE: Right   ADLs: Overall ADLs: States decreased ability to grab, hold household objects, pain and difficulty  to open containers, perform FMS tasks (manipulate fasteners on clothing), mild bathing problems as well.    FUNCTIONAL OUTCOME MEASURES: 06/19/23: Quick DASH: 18% now   Eval: Quick DASH 22.7% impairment today  (Higher % Score  =  More Impairment)     UPPER EXTREMITY ROM     Shoulder to Wrist AROM Left eval  Wrist flexion Within functional limits  Wrist extension Within functional limits  (Blank rows = not tested)   Hand AROM Left eval Lt 04/07/23 Lt 05/20/23 Lt 05/27/23 Lt 06/03/23 Lt 06/10/23 Lt 06/19/23  Full Fist Ability (or Gap to Distal Palmar Crease) Unable with the left index finger due to stiffness in a swan-neck position        Thumb Opposition  (Kapandji Scale)  WFL        Index MCP (0-90) Some stiffness and decreased flexion, full extension 0-67 0 - 90 0-72 0 - 74 0 - 99 0 - 95  Index PIP (0-100) Stiffness in hyperextension with no significant ability to bend into flexion 0-54 0 - 41 0-41 0 - 50 0 - 64 0 - 65  Index DIP (0-70) Drooping about 30 degrees into flexion with inability to correct, also stiff to flex  (-14) -36 (-7) - 29 (-5) - 38 (-5) - 36 (-4) - 42  (Blank rows = not tested)   HAND FUNCTION: 06/19/23: Lt tip pinch 2#, Lt Grip: 28.5# (Rt: 42#)   06/10/23:   Lt tip pinch: Lt 0.5#; Rt Grip: 9#   Eval: Observed weakness in affected Lt hand, due to stiffness and weakness through the index finger.  Details will be tested after healing phase which is at least 6 to 8 weeks. Grip strength Right: TBD lbs, Left: TBD lbs   COORDINATION: 06/10/23: 9 Hole Peg Test Rt: 20 sec; Left: 25.8 sec (27 sec is WFL)   Eval: Observed coordination impairments with affected left hand, as she is unable to effectively use her index finger.  Details will be tested when able after the initial 6 to 8-week healing phase.   SENSATION: Eval:  Light touch intact today, though states occasional tingling in the tip of her index finger  EDEMA:   04/07/23: 6.4cm swelling circumferentially  around Lt IF P1 (compared to 6.2cm in other hand)   Eval:  Mildly swollen in left index finger and hand   OBSERVATIONS:   06/19/23: Finger seems to be moving in flexion and extension better than ever, strength is improving, still has some mild swelling and redness and she needs to wean away from her brace as she has become somewhat nervous and dependent on it.  TODAY'S TREATMENT:  06/19/23: Pt performs AROM, gripping, and strength with left index finger and hand against therapist's resistance for exercise/activities as well as new measures today. OT also discusses home and functional tasks with the pt and reviews goals. Using the complied data, OT also reviews home exercises and provides updated recommendations and upgrades as below.  We are now working  into more functional gripping and activities with therapy putty, now doing unrestricted stretches.  We are going to completely wean from the daytime orthosis now, but recommended to still wear the nighttime orthosis for up to a month.  We discussed her returning to all activities now and not being fearful that she could "hurt" her head now.  She has some apprehension, but pt states understanding and tolerates upgrades well.     Exercises - HOOK Stretch  - 4-6 x daily - 3-5 reps - 15-20 sec hold - Seated Finger Composite Flexion Stretch  - 4-6 x daily - 3-5 reps - 15 hold - Hand AROM DIP Blocking  - 4-6 x daily - 10 reps - 2 seconds hold - Tendon Glides  - 4-6 x daily - 5 reps - 2-3 seconds hold - Full Fist  - 2-3 x daily - 5 reps - 10 sec hold - Thumb Opposition with Putty  - 2-3 x daily - 5 reps - 5- 10 sec hold - Finger Extension "Pizza!"   - 2-3 x daily - 5 reps    PATIENT EDUCATION: Education details: See tx section above for details  Person educated: Patient Education method: Engineer, structural, Teach back, Handouts  Education comprehension: States and demonstrates understanding, Additional Education required    HOME EXERCISE  PROGRAM: Access Code: [email or web address] URL: [email or web address] Date: 05/27/2023 Prepared by: [name]   GOALS: Goals reviewed with patient? Yes   SHORT TERM GOALS: (STG required if POC>30 days) Target Date: 03/31/2023  Pt will obtain protective, custom orthotic. Goal status: 03/31/2023 MET     LONG TERM GOALS: Target Date: 07/03/2023  Pt will improve functional ability by decreased impairment per Quick DASH assessment from 22.7% to 15% or better, for better quality of life. Goal status: 06/19/23: Down to 18% now   2.  Pt will improve grip strength in left hand to at least 35 lbs for functional use at home and in IADLs. (After initial 6 to 8 weeks of healing phase) Goal status: 06/19/23: Now 28 pounds  3.  Pt will improve A/ROM in left index finger total active motion to at least 220*, to have functional motion for tasks like reach and grasp.  (After initial 6 to 8 weeks of healing phase) Goal status: 06/19/23: Up to 198 degrees now  4.  Pt will demo/state understanding of initial HEP to improve pain levels and prerequisite motion. (After initial healing phase for 6 to 8 weeks) Goal status: 06/19/23: Goal met  5.  Pt will improve coordination skills in left hand, as seen by within functional limit score on nine-hole peg testing to have increased functional ability to carry out fine motor tasks (fasteners, etc.) and more complex, coordinated IADLs (meal prep, sports, etc.).  (After initial 6 to 8 weeks of healing phase) Goal status: 06/19/23: Goal met    ASSESSMENT:  CLINICAL IMPRESSION: 06/19/23: We did a progress note today and she still has some stiffness and weakness, likely due to apprehension for weaning orthosis and getting back into all activities.  She is still seen leaving her index finger extended sometimes with activities.  We discussed this, upgrading her exercises, and decided that she needs 1-2 additional visits to ensure her recovery.   PLAN:  OT  FREQUENCY: 1x/week  OT DURATION: Up to 3 additional weeks from 06/12/2023 through 07/03/2023 and up to 9 total visits as needed   PLANNED INTERVENTIONS: 97168 OT Re-evaluation, 97535 self care/ADL training, 16109 therapeutic exercise, 97530 therapeutic activity, [phone]  neuromuscular re-education, 97140 manual therapy, 97035 ultrasound, 09811 fluidotherapy, 97010 moist heat, 97010 cryotherapy, 97760 Orthotics management and training, [phone] Splinting (initial encounter), [phone] Subsequent splinting/medication, compression bandaging, Dry needling, coping strategies training, patient/family education, and DME and/or AE instructions  CONSULTED AND AGREED WITH PLAN OF CARE: Patient and family member/caregiver  PLAN FOR NEXT SESSION:   Check grip, motion, etc., perform functional activities as helpful.  Consider discharge when ready.   [name], OTR/L, CHT 06/19/2023, 11:05 AM

## 2023-06-19 ENCOUNTER — Ambulatory Visit (INDEPENDENT_AMBULATORY_CARE_PROVIDER_SITE_OTHER): Admitting: Rehabilitative and Restorative Service Providers"

## 2023-06-19 ENCOUNTER — Encounter: Payer: Self-pay | Admitting: Rehabilitative and Restorative Service Providers"

## 2023-06-19 DIAGNOSIS — R600 Localized edema: Secondary | ICD-10-CM | POA: Diagnosis not present

## 2023-06-19 DIAGNOSIS — M6281 Muscle weakness (generalized): Secondary | ICD-10-CM

## 2023-06-19 DIAGNOSIS — M20012 Mallet finger of left finger(s): Secondary | ICD-10-CM

## 2023-06-19 DIAGNOSIS — M79642 Pain in left hand: Secondary | ICD-10-CM

## 2023-06-19 DIAGNOSIS — M25642 Stiffness of left hand, not elsewhere classified: Secondary | ICD-10-CM

## 2023-06-19 DIAGNOSIS — R278 Other lack of coordination: Secondary | ICD-10-CM | POA: Diagnosis not present

## 2023-06-24 NOTE — Therapy (Signed)
 OUTPATIENT OCCUPATIONAL THERAPY TREATMENT NOTE  Patient Name: Kalysta Kneisley MRN: [id number] DOB:03-16-1951, 73 y.o., female Today's Date: 06/25/2023  PCP: Nadine Counts MD REFERRING PROVIDER: Samuella Cota, MD     END OF SESSION:  OT End of Session - 06/25/23 1007     Visit Number 8    Number of Visits 9    Date for OT Re-Evaluation 07/03/23    Authorization Type Medicare    OT Start Time 1008    OT Stop Time 1058    OT Time Calculation (min) 50 min    Activity Tolerance Patient tolerated treatment well;No increased pain;Patient limited by fatigue    Behavior During Therapy [facility] for tasks assessed/performed             Past Medical History:  Diagnosis Date   GERD (gastroesophageal reflux disease)    Thyroid disease    Past Surgical History:  Procedure Laterality Date   CERVICAL SPINE SURGERY     herniated disc   Patient Active Problem List   Diagnosis Date Noted   Calculus of gallbladder without cholecystitis without obstruction 06/19/2022   Hernia, hiatal 02/09/2019   Laryngopharyngeal reflux (LPR) 08/23/2018   Osteopenia after menopause 08/05/2018   Osteoporosis 09/15/2016   Vitamin D deficiency 07/31/2016   Menopausal and perimenopausal disorder 12/08/2014   GERD (gastroesophageal reflux disease) 07/25/2013   Hypothyroidism 07/25/2013    ONSET DATE: Oct 2024  REFERRING DIAG: M20.012 (ICD-10-CM) - Mallet finger of left finger(s)   THERAPY DIAG:  Localized edema  Stiffness of left hand, not elsewhere classified  Mallet finger of left finger(s)  Other lack of coordination  Pain in left hand  Muscle weakness (generalized)  Rationale for Evaluation and Treatment: Rehabilitation  PERTINENT HISTORY: Per MD note: "left index mallet injury that occurred in October of this year (2024). She was seen by her family practitioner and placed into an AlumaFoam splint, which she subsequently wore for approximately 8 weeks as instructed. The splint did spanned both  the DIP and PIP of the index finger. " She states that she injured her finger playing catch with a football in October 2024.  She had an avulsion fracture to the terminal tendon to the left index finger and resulting mallet finger injury.  She states that she was given an AlumaFoam will splint which went over her entire left index finger that she wore for the better part of 2 months.  Unfortunately she also states removing it frequently to sleep and also to take baths and showers.  Now she presents with a swan-neck deformity in the left index finger, stiffness, pain, swelling and decreased functional ability.   PRECAUTIONS: None ; RED FLAGS: None   WEIGHT BEARING RESTRICTIONS: WBAT   SUBJECTIVE:   SUBJECTIVE STATEMENT: Lt IF mallet injury ~11 weeks post beginning immobilization. Today she states she had some acute swelling for some reason, and started wearing her daytime brace periodically again.  She also stopped doing certain stretches as she felt they were painful.    PAIN:  Are you having pain?   Nothing significant right now at rest   PATIENT GOALS: To improve the use and mobility of her left nondominant hand  NEXT MD VISIT: As needed    OBJECTIVE: (All objective assessments below are from initial evaluation on: 03/31/23 unless otherwise specified.)   HAND DOMINANCE: Right   ADLs: Overall ADLs: States decreased ability to grab, hold household objects, pain and difficulty to open containers, perform FMS tasks (manipulate fasteners on clothing), mild  bathing problems as well.    FUNCTIONAL OUTCOME MEASURES: 06/19/23: Quick DASH: 18% now   Eval: Quick DASH 22.7% impairment today  (Higher % Score  =  More Impairment)     UPPER EXTREMITY ROM     Shoulder to Wrist AROM Left eval  Wrist flexion Within functional limits  Wrist extension Within functional limits  (Blank rows = not tested)   Hand AROM Left eval Lt 04/07/23 Lt 05/20/23 Lt 05/27/23 Lt 06/03/23 Lt 06/10/23  Lt 06/19/23 Lt 06/25/23  Full Fist Ability (or Gap to Distal Palmar Crease) Unable with the left index finger due to stiffness in a swan-neck position         Thumb Opposition  (Kapandji Scale)  WFL         Index MCP (0-90) Some stiffness and decreased flexion, full extension 0-67 0 - 90 0-72 0 - 74 0 - 99 0 - 95 0 - 95 (98 post manual)  Index PIP (0-100) Stiffness in hyperextension with no significant ability to bend into flexion 0-54 0 - 41 0-41 0 - 50 0 - 64 0 - 65 0- 62 (65 post manual)  Index DIP (0-70) Drooping about 30 degrees into flexion with inability to correct, also stiff to flex  (-14) -36 (-7) - 29 (-5) - 38 (-5) - 36 (-4) - 42 (-4) - 41 (34 post manual)  (Blank rows = not tested)   HAND FUNCTION: 06/25/23: Tip pinch Lt: 2.5#, Lt grip: 31.7#   06/19/23: Lt tip pinch 2#, Lt Grip: 28.5# (Rt: 42#)   06/10/23:   Lt tip pinch: Lt 0.5#; Rt Grip: 9#   Eval: Observed weakness in affected Lt hand, due to stiffness and weakness through the index finger.  Details will be tested after healing phase which is at least 6 to 8 weeks. Grip strength Right: TBD lbs, Left: TBD lbs   COORDINATION: 06/25/23: 9HPT: Lt : 21sec    06/10/23: 9 Hole Peg Test Rt: 20 sec; Left: 25.8 sec (27 sec is WFL)   Eval: Observed coordination impairments with affected left hand, as she is unable to effectively use her index finger.  Details will be tested when able after the initial 6 to 8-week healing phase.   SENSATION: Eval:  Light touch intact today, though states occasional tingling in the tip of her index finger  EDEMA:   04/07/23: 6.4cm swelling circumferentially around Lt IF P1 (compared to 6.2cm in other hand)    OBSERVATIONS:   06/19/23: Finger seems to be moving in flexion and extension better than ever, strength is improving, still has some mild swelling and redness and she needs to wean away from her brace as she has become somewhat nervous and dependent on it.  TODAY'S TREATMENT:  06/25/23: She  starts with active range of motion which shows no significant progress since last week.  OT is unsure why she is not making progress, it could be that her stretches were not rigorous enough, it could be due to slight exacerbation and new swelling reported by the patient and return to daytime brace occasionally, or could be due to still present guarding behavior where she tends to extend her index finger somewhat nervously.  We discussed possible options, OT performs strong manual therapy stretches with her today after IASTM massage and some heat.  Unfortunately this does not cause much of a change in active range of motion afterward.  OT feels that it needs to be more consistent, and provides her with buddy  straps that she can wear during the day with activity to help promote motion and flexion of the index finger rather than any kind of nervous extension.  We also briefly review her therapy putty activities with which she states she does fairly well 2-3 times a day.  Lastly, OT asked her to bring in her nighttime extension orthosis for possible adjustment as it may be putting her PIP joint and some hyperextension, and she is asked to try to wean out of this and not wear it at night this week and see how she tolerates it.  Anything that makes her swell or very sore and painful she should slow down on, do less intensely or less frequently.  Otherwise, if she is just stiff she should push a bit harder move a bit more and squeeze a bit harder.  She states understanding and leaves in no significant pain and has no significant swelling.   PATIENT EDUCATION: Education details: See tx section above for details  Person educated: Patient Education method: Verbal Instruction, Teach back, Handouts  Education comprehension: States and demonstrates understanding, Additional Education required    HOME EXERCISE PROGRAM: Access Code: [email or web address] URL: [email or web address] Date: 05/27/2023 Prepared by:  [name]   GOALS: Goals reviewed with patient? Yes   SHORT TERM GOALS: (STG required if POC>30 days) Target Date: 03/31/2023  Pt will obtain protective, custom orthotic. Goal status: 03/31/2023 MET     LONG TERM GOALS: Target Date: 07/03/2023  Pt will improve functional ability by decreased impairment per Quick DASH assessment from 22.7% to 15% or better, for better quality of life. Goal status: 06/19/23: Down to 18% now   2.  Pt will improve grip strength in left hand to at least 35 lbs for functional use at home and in IADLs. (After initial 6 to 8 weeks of healing phase) Goal status: 06/19/23: Now 28 pounds  3.  Pt will improve A/ROM in left index finger total active motion to at least 220*, to have functional motion for tasks like reach and grasp.  (After initial 6 to 8 weeks of healing phase) Goal status: 06/19/23: Up to 198 degrees now  4.  Pt will demo/state understanding of initial HEP to improve pain levels and prerequisite motion. (After initial healing phase for 6 to 8 weeks) Goal status: 06/19/23: Goal met  5.  Pt will improve coordination skills in left hand, as seen by within functional limit score on nine-hole peg testing to have increased functional ability to carry out fine motor tasks (fasteners, etc.) and more complex, coordinated IADLs (meal prep, sports, etc.).  (After initial 6 to 8 weeks of healing phase) Goal status: 06/19/23: Goal met    ASSESSMENT:  CLINICAL IMPRESSION: 06/25/23: See back next week to possibly adjust nighttime orthosis or discharge completely.  She is not making much progress for flexion which is a bit strange if she has been using her hand at all times throughout the day, and stretching often, though stretches were also not very useful today and may represent some capsular tightness for which she needs a dynamic mobilization orthosis.  06/19/23: We did a progress note today and she still has some stiffness and weakness, likely due to  apprehension for weaning orthosis and getting back into all activities.  She is still seen leaving her index finger extended sometimes with activities.  We discussed this, upgrading her exercises, and decided that she needs 1-2 additional visits to ensure her recovery.   PLAN:  OT FREQUENCY: 1x/week  OT DURATION: Up to 3 additional weeks from 06/12/2023 through 07/03/2023 and up to 9 total visits as needed   PLANNED INTERVENTIONS: 97168 OT Re-evaluation, 97535 self care/ADL training, 16109 therapeutic exercise, 97530 therapeutic activity, 97112 neuromuscular re-education, 97140 manual therapy, 97035 ultrasound, 97039 fluidotherapy, 97010 moist heat, 97010 cryotherapy, 97760 Orthotics management and training, 60454 Splinting (initial encounter), [phone] Subsequent splinting/medication, compression bandaging, Dry needling, coping strategies training, patient/family education, and DME and/or AE instructions  CONSULTED AND AGREED WITH PLAN OF CARE: Patient and family member/caregiver  PLAN FOR NEXT SESSION:   Check on weaning nighttime orthosis, check range of motion, check grip perform a progress note and decide if a dynamic mobilization orthosis is required.   [name], OTR/L, CHT 06/25/2023, 11:04 AM

## 2023-06-25 ENCOUNTER — Encounter: Payer: Self-pay | Admitting: Rehabilitative and Restorative Service Providers"

## 2023-06-25 ENCOUNTER — Ambulatory Visit (INDEPENDENT_AMBULATORY_CARE_PROVIDER_SITE_OTHER): Admitting: Rehabilitative and Restorative Service Providers"

## 2023-06-25 DIAGNOSIS — M6281 Muscle weakness (generalized): Secondary | ICD-10-CM

## 2023-06-25 DIAGNOSIS — R600 Localized edema: Secondary | ICD-10-CM | POA: Diagnosis not present

## 2023-06-25 DIAGNOSIS — M79642 Pain in left hand: Secondary | ICD-10-CM

## 2023-06-25 DIAGNOSIS — M25642 Stiffness of left hand, not elsewhere classified: Secondary | ICD-10-CM

## 2023-06-25 DIAGNOSIS — R278 Other lack of coordination: Secondary | ICD-10-CM

## 2023-06-25 DIAGNOSIS — M20012 Mallet finger of left finger(s): Secondary | ICD-10-CM | POA: Diagnosis not present

## 2023-07-01 NOTE — Therapy (Signed)
 OUTPATIENT OCCUPATIONAL THERAPY TREATMENT& discharge NOTE  Patient Name: Karla [name] MRN: [id number] DOB:1950-12-22, 73 y.o., female Today's Date: 07/02/2023  PCP: Nadine Counts MD REFERRING PROVIDER: Samuella Cota, MD               OCCUPATIONAL THERAPY DISCHARGE SUMMARY  Visits from Start of Care: 9  Current functional level related to goals / functional outcomes: 07/02/23: She has met 4 out of 5 of her long-term goals, and significantly improved her total active motion from 153-195.  She has had some issues with swan-neck, hyperextension at the PIP joint which is now the stiffness joint, and was also fabricated a custom dynamic mobilization orthosis to help with that.  She has an excellent plan and tools, she just needs to manage this for likely 2-3 more months until she is at the point where she is satisfied with her motion.  She could return in the long run, if she has any complications and needs help.  She would need a new referral though.  Education / Equipment: Pt has all needed materials and education. Pt understands how to continue on with self-management. See tx notes for more details.   Patient agrees to discharge due to max benefits received from outpatient occupational therapy / hand therapy at this time.   [name], OTR/L, CHT 07/02/23              END OF SESSION:  OT End of Session - 07/02/23 1346     Visit Number 9    Number of Visits 9    Date for OT Re-Evaluation 07/03/23    Authorization Type Medicare    OT Start Time 1346    OT Stop Time 1435    OT Time Calculation (min) 49 min    Equipment Utilized During Treatment orthotic materials    Activity Tolerance Patient tolerated treatment well;No increased pain    Behavior During Therapy WFL for tasks assessed/performed             Past Medical History:  Diagnosis Date   GERD (gastroesophageal reflux disease)    Thyroid disease    Past Surgical History:  Procedure  Laterality Date   CERVICAL SPINE SURGERY     herniated disc   Patient Active Problem List   Diagnosis Date Noted   Calculus of gallbladder without cholecystitis without obstruction 06/19/2022   Hernia, hiatal 02/09/2019   Laryngopharyngeal reflux (LPR) 08/23/2018   Osteopenia after menopause 08/05/2018   Osteoporosis 09/15/2016   Vitamin D deficiency 07/31/2016   Menopausal and perimenopausal disorder 12/08/2014   GERD (gastroesophageal reflux disease) 07/25/2013   Hypothyroidism 07/25/2013    ONSET DATE: Oct 2024  REFERRING DIAG: M20.012 (ICD-10-CM) - Mallet finger of left finger(s)   THERAPY DIAG:  Localized edema  Stiffness of left hand, not elsewhere classified  Mallet finger of left finger(s)  Other lack of coordination  Pain in left hand  Muscle weakness (generalized)  Rationale for Evaluation and Treatment: Rehabilitation  PERTINENT HISTORY: Per MD note: "left index mallet injury that occurred in October of this year (2024). She was seen by her family practitioner and placed into an AlumaFoam splint, which she subsequently wore for approximately 8 weeks as instructed. The splint did spanned both the DIP and PIP of the index finger. " She states that she injured her finger playing catch with a football in October 2024.  She had an avulsion fracture to the terminal tendon to the left index finger and resulting mallet finger injury.  She  states that she was given an AlumaFoam will splint which went over her entire left index finger that she wore for the better part of 2 months.  Unfortunately she also states removing it frequently to sleep and also to take baths and showers.  Now she presents with a swan-neck deformity in the left index finger, stiffness, pain, swelling and decreased functional ability.   PRECAUTIONS: None ; RED FLAGS: None   WEIGHT BEARING RESTRICTIONS: WBAT   SUBJECTIVE:   SUBJECTIVE STATEMENT: Lt IF mallet injury ~12 weeks post beginning  immobilization. Today she states weaning from nighttime orthosis completely, feeling like she is bending her hand better and is stronger, having less functional problems, still having small drooping and still having some stiffness.    PAIN:  Are you having pain?   Nothing significant right now at rest   PATIENT GOALS: To improve the use and mobility of her left nondominant hand  NEXT MD VISIT: As needed    OBJECTIVE: (All objective assessments below are from initial evaluation on: 03/31/23 unless otherwise specified.)   HAND DOMINANCE: Right   ADLs: Overall ADLs: States no significant functional problems now.   FUNCTIONAL OUTCOME MEASURES: 07/02/23: Quick DASH 9%   Eval: Quick DASH 22.7% impairment today  (Higher % Score  =  More Impairment)     UPPER EXTREMITY ROM     Shoulder to Wrist AROM Left eval  Wrist flexion Within functional limits  Wrist extension Within functional limits  (Blank rows = not tested)   Hand AROM Left eval Lt 04/07/23 Lt 05/20/23 Lt 07/02/23  Full Fist Ability (or Gap to Distal Palmar Crease) Unable with the left index finger due to stiffness in a swan-neck position     Thumb Opposition  (Kapandji Scale)  WFL     Index MCP (0-90) Some stiffness and decreased flexion, full extension 0-67 0 - 90 0 - 94  Index PIP (0-100) Stiffness in hyperextension with no significant ability to bend into flexion 0-54 0 - 41 0 - 64  Index DIP (0-70) Drooping about 30 degrees into flexion with inability to correct, also stiff to flex  (-14) -36 (-12) - 49  (Blank rows = not tested)   HAND FUNCTION: 07/02/23: Grip Lt: 38#, Lt tip pinch 4#   06/19/23: Lt tip pinch 2#, Lt Grip: 28.5# (Rt: 42#)   06/10/23:   Lt tip pinch: Lt 0.5#; Rt Grip: 9#   Eval: Observed weakness in affected Lt hand, due to stiffness and weakness through the index finger.  Details will be tested after healing phase which is at least 6 to 8 weeks.  COORDINATION: 06/25/23: 9HPT: Lt : 21sec     06/10/23: 9 Hole Peg Test Rt: 20 sec; Left: 25.8 sec (27 sec is WFL)   Eval: Observed coordination impairments with affected left hand, as she is unable to effectively use her index finger.  Details will be tested when able after the initial 6 to 8-week healing phase.    TODAY'S TREATMENT:  07/02/23: Pt performs AROM, gripping, and strength with left hand against therapist's resistance for exercise/activities as well as new measures today. OT also discusses home and functional tasks with the pt and reviews goals.  She is now met most of her long-term goals except for total active motion.  Her PIP joint is the most stiff of her joints, and she expresses desire in getting a dynamic mobilization orthosis to assist her stretching routine.  OT does think it is warranted at this  point, and can custom fabricates a dynamic left hand index finger mobilization orthosis-actually modifying her nighttime resting orthosis to make this.  It fit well, gave her an excellent stretch, and she was given education on when and how to wear this safely.  It is an adjunct to her current plan, not the "end all, be all."  Other home exercises were also reviewed as well as periodically wearing a night brace or in the day to help with improving active droop in the DIP joint, which has been effective so far.  OT explains that this is a balance and she needs to find the right max of activity, squeezing and gripping, dynamic bracing, and extension bracing.  She states feeling comfortable with this and will discharge today to manage the remaining long-term goals on her own.   PATIENT EDUCATION: Education details: See tx section above for details  Person educated: Patient Education method: Verbal Instruction, Teach back, Handouts  Education comprehension: States and demonstrates understanding   HOME EXERCISE PROGRAM: Access Code: [email or web address] URL: [email or web address] Date: 05/27/2023 Prepared by: [name]   GOALS: Goals reviewed with patient? Yes   SHORT TERM GOALS: (STG required if POC>30 days) Target Date: 03/31/2023  Pt will obtain protective, custom orthotic. Goal status: 03/31/2023 MET     LONG TERM GOALS: Target Date: 07/03/2023  Pt will improve functional ability by decreased impairment per Quick DASH assessment from 22.7% to 15% or better, for better quality of life. Goal status: 07/02/23: MET  06/19/23: Down to 18% now   2.  Pt will improve grip strength in left hand to at least 35 lbs for functional use at home and in IADLs. (After initial 6 to 8 weeks of healing phase) Goal status: 07/02/23: MET 38#  3.  Pt will improve A/ROM in left index finger total active motion to at least 220*, to have functional motion for tasks like reach and grasp.  (After initial 6 to 8 weeks of healing phase) Goal status: 07/02/23: Not met- Greatly improved from 153* to 195* now   4.  Pt will demo/state understanding of initial HEP to improve pain levels and prerequisite motion. (After initial healing phase for 6 to 8 weeks) Goal status: 06/19/23: Goal met  5.  Pt will improve coordination skills in left hand, as seen by within functional limit score on nine-hole peg testing to have increased functional ability to carry out fine motor tasks (fasteners, etc.) and more complex, coordinated IADLs (meal prep, sports, etc.).  (After initial 6 to 8 weeks of healing phase) Goal status: 06/19/23: Goal met    ASSESSMENT:  CLINICAL IMPRESSION: 07/02/23: She has met 4 out of 5 of her long-term goals, and significantly improved her total active motion from 153-195.  She has had some issues with swan-neck, hyperextension at the PIP joint which is now the stiffness joint, and was also fabricated a custom dynamic mobilization orthosis to help with that.  She has an excellent plan and tools, she just needs to manage this for likely 2-3 more months until she is at the point where she is satisfied with her  motion.  She could return in the long run, if she has any complications and needs help.  She would need a new referral though.   PLAN:  OT FREQUENCY: Discharge  OT DURATION: Discharge  PLANNED INTERVENTIONS: 97168 OT Re-evaluation, 97535 self care/ADL training, 16109 therapeutic exercise, 97530 therapeutic activity, 97112 neuromuscular re-education, 97140 manual therapy, 97035 ultrasound, 97039 fluidotherapy, 97010 moist heat, 97010  cryotherapy, [phone] Orthotics management and training, 91478 Splinting (initial encounter), [phone] Subsequent splinting/medication, compression bandaging, Dry needling, coping strategies training, patient/family education, and DME and/or AE instructions  CONSULTED AND AGREED WITH PLAN OF CARE: Patient and family member/caregiver  PLAN FOR NEXT SESSION:   Discharge   [name], OTR/L, CHT 07/02/2023, 2:42 PM

## 2023-07-02 ENCOUNTER — Ambulatory Visit

## 2023-07-02 ENCOUNTER — Encounter: Payer: Self-pay | Admitting: Rehabilitative and Restorative Service Providers"

## 2023-07-02 ENCOUNTER — Encounter (INDEPENDENT_AMBULATORY_CARE_PROVIDER_SITE_OTHER): Payer: Self-pay | Admitting: Family Medicine

## 2023-07-02 ENCOUNTER — Ambulatory Visit (INDEPENDENT_AMBULATORY_CARE_PROVIDER_SITE_OTHER): Admitting: Rehabilitative and Restorative Service Providers"

## 2023-07-02 DIAGNOSIS — W57XXXA Bitten or stung by nonvenomous insect and other nonvenomous arthropods, initial encounter: Secondary | ICD-10-CM

## 2023-07-02 DIAGNOSIS — R278 Other lack of coordination: Secondary | ICD-10-CM

## 2023-07-02 DIAGNOSIS — S70362A Insect bite (nonvenomous), left thigh, initial encounter: Secondary | ICD-10-CM | POA: Diagnosis not present

## 2023-07-02 DIAGNOSIS — M20012 Mallet finger of left finger(s): Secondary | ICD-10-CM

## 2023-07-02 DIAGNOSIS — E538 Deficiency of other specified B group vitamins: Secondary | ICD-10-CM | POA: Diagnosis not present

## 2023-07-02 DIAGNOSIS — R600 Localized edema: Secondary | ICD-10-CM

## 2023-07-02 DIAGNOSIS — M25642 Stiffness of left hand, not elsewhere classified: Secondary | ICD-10-CM

## 2023-07-02 DIAGNOSIS — M6281 Muscle weakness (generalized): Secondary | ICD-10-CM

## 2023-07-02 DIAGNOSIS — M79642 Pain in left hand: Secondary | ICD-10-CM

## 2023-07-02 MED ORDER — DOXYCYCLINE HYCLATE 100 MG PO TABS: 200.0000 mg | ORAL_TABLET | Freq: Once | ORAL | 0 refills | Status: AC

## 2023-07-02 NOTE — Telephone Encounter (Signed)
 Please see the MyChart message reply(ies) for my assessment and plan.   Patient actually came in for a B12 shot I directly visualized the left posterior medial thigh which demonstrated minimal surrounding erythema.  No induration.  No red flag signs or symptoms.  Home care instructions were reviewed and reasons for return discussed.  She was good understanding   This patient gave consent for this Medical Advice Message and is aware that it may result in a bill to [employer], as well as the possibility of receiving a bill for a co-payment or deductible. They are an established patient, but are not seeking medical advice exclusively about a problem treated during an in person or video visit in the last seven days. I did not recommend an in person or video visit within seven days of my reply.    I spent a total of 5 minutes cumulative time within 7 days through [employer].  Delynn Flavin, DO

## 2023-07-02 NOTE — Progress Notes (Signed)
 Patient is in office today for a nurse visit for B12 Injection. Patient Injection was given in the  Left deltoid. Patient tolerated injection well.

## 2023-07-09 ENCOUNTER — Encounter: Admitting: Rehabilitative and Restorative Service Providers"

## 2023-08-01 ENCOUNTER — Encounter (INDEPENDENT_AMBULATORY_CARE_PROVIDER_SITE_OTHER): Payer: Self-pay | Admitting: Family Medicine

## 2023-08-01 DIAGNOSIS — S80862A Insect bite (nonvenomous), left lower leg, initial encounter: Secondary | ICD-10-CM | POA: Diagnosis not present

## 2023-08-01 DIAGNOSIS — W57XXXA Bitten or stung by nonvenomous insect and other nonvenomous arthropods, initial encounter: Secondary | ICD-10-CM

## 2023-08-04 ENCOUNTER — Ambulatory Visit (INDEPENDENT_AMBULATORY_CARE_PROVIDER_SITE_OTHER)

## 2023-08-04 DIAGNOSIS — E538 Deficiency of other specified B group vitamins: Secondary | ICD-10-CM | POA: Diagnosis not present

## 2023-08-04 MED ORDER — DOXYCYCLINE HYCLATE 100 MG PO TABS: 200.0000 mg | ORAL_TABLET | Freq: Once | ORAL | 0 refills | Status: AC

## 2023-08-04 NOTE — Telephone Encounter (Signed)
 Hello, I have sent in doxycyline prescription to your pharmacy. Let us  know if your symptoms worsen or do not improve.    Tommas Fragmin, FNP   Approximately 5 minutes was spent documenting and reviewing patient's chart.

## 2023-08-04 NOTE — Progress Notes (Signed)
 Patient is in office today for a nurse visit for B12 Injection. Patient Injection was given in the  Right deltoid. Patient tolerated injection well.

## 2023-09-07 ENCOUNTER — Ambulatory Visit (INDEPENDENT_AMBULATORY_CARE_PROVIDER_SITE_OTHER): Admitting: *Deleted

## 2023-09-07 ENCOUNTER — Telehealth: Payer: Self-pay | Admitting: Family Medicine

## 2023-09-07 DIAGNOSIS — E538 Deficiency of other specified B group vitamins: Secondary | ICD-10-CM

## 2023-09-07 DIAGNOSIS — M810 Age-related osteoporosis without current pathological fracture: Secondary | ICD-10-CM

## 2023-09-07 DIAGNOSIS — I700 Atherosclerosis of aorta: Secondary | ICD-10-CM

## 2023-09-07 DIAGNOSIS — E034 Atrophy of thyroid (acquired): Secondary | ICD-10-CM

## 2023-09-07 DIAGNOSIS — E7800 Pure hypercholesterolemia, unspecified: Secondary | ICD-10-CM

## 2023-09-07 NOTE — Telephone Encounter (Signed)
 Message did not come through what was needed?

## 2023-09-07 NOTE — Telephone Encounter (Signed)
  Appointment (Patient has appt 7-2 for labs for her physical 7-7 with Dr. Crissie Dome. Please put orders in.)

## 2023-09-07 NOTE — Progress Notes (Signed)
 Patient is in office today for a nurse visit for B12 Injection. Patient Injection was given in the  Right deltoid. Patient tolerated injection well.

## 2023-09-08 NOTE — Telephone Encounter (Signed)
 Future orders placed. LS

## 2023-09-23 ENCOUNTER — Other Ambulatory Visit

## 2023-09-23 DIAGNOSIS — E034 Atrophy of thyroid (acquired): Secondary | ICD-10-CM

## 2023-09-23 DIAGNOSIS — E7800 Pure hypercholesterolemia, unspecified: Secondary | ICD-10-CM

## 2023-09-23 DIAGNOSIS — M810 Age-related osteoporosis without current pathological fracture: Secondary | ICD-10-CM

## 2023-09-23 DIAGNOSIS — I700 Atherosclerosis of aorta: Secondary | ICD-10-CM

## 2023-09-23 DIAGNOSIS — E538 Deficiency of other specified B group vitamins: Secondary | ICD-10-CM

## 2023-09-24 ENCOUNTER — Ambulatory Visit: Payer: Self-pay | Admitting: Family Medicine

## 2023-09-24 LAB — CBC
Hematocrit: 41.4 % (ref 34.0–46.6)
Hemoglobin: 13.7 g/dL (ref 11.1–15.9)
MCH: 33.5 pg — ABNORMAL HIGH (ref 26.6–33.0)
MCHC: 33.1 g/dL (ref 31.5–35.7)
MCV: 101 fL — ABNORMAL HIGH (ref 79–97)
Platelets: 244 10*3/uL (ref 150–450)
RBC: 4.09 x10E6/uL (ref 3.77–5.28)
RDW: 11.8 % (ref 11.7–15.4)
WBC: 4.8 10*3/uL (ref 3.4–10.8)

## 2023-09-24 LAB — CMP14+EGFR
ALT: 14 IU/L (ref 0–32)
AST: 27 IU/L (ref 0–40)
Albumin: 4.4 g/dL (ref 3.9–4.9)
Alkaline Phosphatase: 44 IU/L (ref 44–121)
BUN/Creatinine Ratio: 19 (ref 12–28)
BUN: 15 mg/dL (ref 8–27)
Bilirubin Total: 0.8 mg/dL (ref 0.0–1.2)
CO2: 24 mmol/L (ref 20–29)
Calcium: 9.6 mg/dL (ref 8.7–10.3)
Chloride: 103 mmol/L (ref 96–106)
Creatinine, Ser: 0.78 mg/dL (ref 0.57–1.00)
Globulin, Total: 2.4 g/dL (ref 1.5–4.5)
Glucose: 82 mg/dL (ref 70–99)
Potassium: 4.7 mmol/L (ref 3.5–5.2)
Sodium: 142 mmol/L (ref 134–144)
Total Protein: 6.8 g/dL (ref 6.0–8.5)
eGFR: 82 mL/min/{1.73_m2} (ref >59)

## 2023-09-24 LAB — LIPID PANEL
Chol/HDL Ratio: 2.6 ratio (ref 0.0–4.4)
Cholesterol, Total: 143 mg/dL (ref 100–199)
HDL: 54 mg/dL (ref >39)
LDL Chol Calc (NIH): 77 mg/dL (ref 0–99)
Triglycerides: 56 mg/dL (ref 0–149)
VLDL Cholesterol Cal: 12 mg/dL (ref 5–40)

## 2023-09-24 LAB — TSH+FREE T4
Free T4: 1.59 ng/dL (ref 0.82–1.77)
TSH: 0.831 u[IU]/mL (ref 0.450–4.500)

## 2023-09-24 LAB — VITAMIN D 25 HYDROXY (VIT D DEFICIENCY, FRACTURES): Vit D, 25-Hydroxy: 67.7 ng/mL (ref 30.0–100.0)

## 2023-09-24 LAB — VITAMIN B12: Vitamin B-12: 1164 pg/mL (ref 232–1245)

## 2023-09-28 ENCOUNTER — Ambulatory Visit (INDEPENDENT_AMBULATORY_CARE_PROVIDER_SITE_OTHER): Payer: Medicare Other | Admitting: Family Medicine

## 2023-09-28 ENCOUNTER — Encounter: Payer: Self-pay | Admitting: Family Medicine

## 2023-09-28 VITALS — BP 130/61 | HR 80 | Temp 98.0°F | Ht 62.0 in | Wt 125.0 lb

## 2023-09-28 DIAGNOSIS — E538 Deficiency of other specified B group vitamins: Secondary | ICD-10-CM

## 2023-09-28 DIAGNOSIS — E7800 Pure hypercholesterolemia, unspecified: Secondary | ICD-10-CM | POA: Diagnosis not present

## 2023-09-28 DIAGNOSIS — E034 Atrophy of thyroid (acquired): Secondary | ICD-10-CM

## 2023-09-28 DIAGNOSIS — I700 Atherosclerosis of aorta: Secondary | ICD-10-CM | POA: Diagnosis not present

## 2023-09-28 DIAGNOSIS — R002 Palpitations: Secondary | ICD-10-CM

## 2023-09-28 DIAGNOSIS — K219 Gastro-esophageal reflux disease without esophagitis: Secondary | ICD-10-CM | POA: Diagnosis not present

## 2023-09-28 DIAGNOSIS — M810 Age-related osteoporosis without current pathological fracture: Secondary | ICD-10-CM

## 2023-09-28 MED ORDER — LEVOTHYROXINE SODIUM 88 MCG PO TABS: 88.0000 ug | ORAL_TABLET | Freq: Every day | ORAL | 3 refills | Status: AC

## 2023-09-28 MED ORDER — ROSUVASTATIN CALCIUM 5 MG PO TABS: ORAL_TABLET | ORAL | 3 refills | Status: AC

## 2023-09-28 MED ORDER — ESOMEPRAZOLE MAGNESIUM 40 MG PO CPDR: 40.0000 mg | DELAYED_RELEASE_CAPSULE | Freq: Every day | ORAL | 0 refills | Status: DC

## 2023-09-28 NOTE — Patient Instructions (Addendum)
 Palpitations Palpitations are feelings that your heartbeat is not normal. Your heartbeat may feel like it is: Uneven (irregular). Faster than normal. Fluttering. Skipping a beat. This is usually not a serious problem. However, a doctor will do tests and check your medical history to make sure that you do not have a serious heart problem. Follow these instructions at home: Watch for any changes in your condition. Tell your doctor about any changes. Take these actions to help manage your symptoms: Eating and drinking Follow instructions from your doctor about things to eat and drink. You may be told to avoid these things: Drinks that have caffeine in them, such as coffee, tea, soft drinks, and energy drinks. Chocolate. Alcohol. Diet pills. Lifestyle     Try to lower your stress. These things can help you relax: Yoga. Deep breathing and meditation. Guided imagery. This is using words and images to create positive thoughts. Exercise, including swimming, jogging, and walking. Tell your doctor if you have more abnormal heartbeats when you are active. If you have chest pain or feel short of breath with exercise, do not keep doing the exercise until you are seen by your doctor. Biofeedback. This is using your mind to control things in your body, such as your heartbeat. Get plenty of rest and sleep. Keep a regular bed time. Do not use drugs, such as cocaine or ecstasy. Do not use marijuana. Do not smoke or use any products that contain nicotine or tobacco. If you need help quitting, ask your doctor. General instructions Take over-the-counter and prescription medicines only as told by your doctor. Keep all follow-up visits. You may need more tests if palpitations do not go away or get worse. Contact a doctor if: You keep having fast or uneven heartbeats for a long time. Your symptoms happen more often. Get help right away if: You have chest pain. You feel short of breath. You have a very  bad headache. You feel dizzy. You faint. These symptoms may be an emergency. Get help right away. Call your local emergency services (911 in the [place] Do not wait to see if the symptoms will go away. Do not drive yourself to the hospital. Summary Palpitations are feelings that your heartbeat is uneven or faster than normal. It may feel like your heart is fluttering or skipping a beat. Avoid food and drinks that may cause this condition. These include caffeine, chocolate, and alcohol. Try to lower your stress. Do not smoke or use drugs. Get help right away if you faint, feel dizzy, feel short of breath, have chest pain, or have a very bad headache. This information is not intended to replace advice given to you by your health care provider. Make sure you discuss any questions you have with your health care provider. Document Revised: 08/01/2020 Document Reviewed: 08/01/2020 Elsevier Patient Education  2024 [employer].  Preventive Care 65 Years and Older, Female Preventive care refers to lifestyle choices and visits with your health care provider that can promote health and wellness. Preventive care visits are also called wellness exams. What can I expect for my preventive care visit? Counseling Your health care provider may ask you questions about your: Medical history, including: Past medical problems. Family medical history. Pregnancy and menstrual history. History of falls. Current health, including: Memory and ability to understand (cognition). Emotional well-being. Home life and relationship well-being. Sexual activity and sexual health. Lifestyle, including: Alcohol, nicotine or tobacco, and drug use. Access to firearms. Diet, exercise, and sleep habits. Work and  work environment. Sunscreen use. Safety issues such as seatbelt and bike helmet use. Physical exam Your health care provider will check your: Height and weight. These may be used to calculate your BMI (body  mass index). BMI is a measurement that tells if you are at a healthy weight. Waist circumference. This measures the distance around your waistline. This measurement also tells if you are at a healthy weight and may help predict your risk of certain diseases, such as type 2 diabetes and high blood pressure. Heart rate and blood pressure. Body temperature. Skin for abnormal spots. What immunizations do I need?  Vaccines are usually given at various ages, according to a schedule. Your health care provider will recommend vaccines for you based on your age, medical history, and lifestyle or other factors, such as travel or where you work. What tests do I need? Screening Your health care provider may recommend screening tests for certain conditions. This may include: Lipid and cholesterol levels. Hepatitis C test. Hepatitis B test. HIV (human immunodeficiency virus) test. STI (sexually transmitted infection) testing, if you are at risk. Lung cancer screening. Colorectal cancer screening. Diabetes screening. This is done by checking your blood sugar (glucose) after you have not eaten for a while (fasting). Mammogram. Talk with your health care provider about how often you should have regular mammograms. BRCA-related cancer screening. This may be done if you have a family history of breast, ovarian, tubal, or peritoneal cancers. Bone density scan. This is done to screen for osteoporosis. Talk with your health care provider about your test results, treatment options, and if necessary, the need for more tests. Follow these instructions at home: Eating and drinking  Eat a diet that includes fresh fruits and vegetables, whole grains, lean protein, and low-fat dairy products. Limit your intake of foods with high amounts of sugar, saturated fats, and salt. Take vitamin and mineral supplements as recommended by your health care provider. Do not drink alcohol if your health care provider tells you not to  drink. If you drink alcohol: Limit how much you have to 0-1 drink a day. Know how much alcohol is in your drink. In the [place] one drink equals one 12 oz bottle of beer (355 mL), one 5 oz glass of wine (148 mL), or one 1 oz glass of hard liquor (44 mL). Lifestyle Brush your teeth every morning and night with fluoride toothpaste. Floss one time each day. Exercise for at least 30 minutes 5 or more days each week. Do not use any products that contain nicotine or tobacco. These products include cigarettes, chewing tobacco, and vaping devices, such as e-cigarettes. If you need help quitting, ask your health care provider. Do not use drugs. If you are sexually active, practice safe sex. Use a condom or other form of protection in order to prevent STIs. Take aspirin only as told by your health care provider. Make sure that you understand how much to take and what form to take. Work with your health care provider to find out whether it is safe and beneficial for you to take aspirin daily. Ask your health care provider if you need to take a cholesterol-lowering medicine (statin). Find healthy ways to manage stress, such as: Meditation, yoga, or listening to music. Journaling. Talking to a trusted person. Spending time with friends and family. Minimize exposure to UV radiation to reduce your risk of skin cancer. Safety Always wear your seat belt while driving or riding in a vehicle. Do not drive: If you  have been drinking alcohol. Do not ride with someone who has been drinking. When you are tired or distracted. While texting. If you have been using any mind-altering substances or drugs. Wear a helmet and other protective equipment during sports activities. If you have firearms in your house, make sure you follow all gun safety procedures. What's next? Visit your health care provider once a year for an annual wellness visit. Ask your health care provider how often you should have your eyes and  teeth checked. Stay up to date on all vaccines. This information is not intended to replace advice given to you by your health care provider. Make sure you discuss any questions you have with your health care provider. Document Revised: 09/05/2020 Document Reviewed: 09/05/2020 Elsevier Patient Education  2024 [employer].

## 2023-09-28 NOTE — Progress Notes (Addendum)
 Karla [name] is a 73 y.o. female presents to office today for annual physical exam examination.    Concerns today include: 1.  Fluttering sensation She reports that she intermittently gets a fluttering sensation in her chest.  It occurs at random.  It lasts a few seconds and then resolves.  She denies any associated shortness of breath, dizziness, chest pain.  She does not consume caffeine due to GERD.  She does admit to some stress.  Her husband just had reverse shoulder surgery.  She is compliant with all medications.  She does sometimes feel like she is getting some breakthrough acid reflux despite compliance with Dexilant and Pepcid.  She has been on this regimen for years.  Sometimes wonders if maybe she should switch back to one of the older regimens.  She has been on Nexium , Zegrid, Prilosec in the past.  No blood in stool.  No nausea or vomiting.  No overt burning in the chest but she just gets an irritation  Occupation: Retired but sometimes takes care of her grandbabies, Marital status: Married, Substance use: None Health Maintenance Due  Topic Date Due   Medicare Annual Wellness (AWV)  11/25/2023   Refills needed today: None  Immunization History  Administered Date(s) Administered   Fluad Quad(high Dose 65+) 12/30/2018, 01/04/2020, 01/31/2021, 02/21/2022   Influenza, High Dose Seasonal PF 01/04/2018   Influenza,inj,Quad PF,6+ Mos 01/04/2014, 12/22/2014, 12/19/2015, 01/08/2017   Influenza-Unspecified 01/30/2023   PFIZER(Purple Top)SARS-COV-2 Vaccination 04/13/2019, 05/02/2019, 01/04/2020   Pneumococcal Conjugate-13 01/04/2014   Pneumococcal Polysaccharide-23 04/08/2018   Tdap 12/01/2013   Zoster, Live 06/05/2014   Past Medical History:  Diagnosis Date   GERD (gastroesophageal reflux disease)    Thyroid  disease    Social History   Socioeconomic History   Marital status: Married    Spouse name: Museum/gallery conservator    Number of children: 2   Years of education: 14   Highest  education level: Associate degree: academic program  Occupational History   Occupation: Retired  Tobacco Use   Smoking status: Never   Smokeless tobacco: Never  Vaping Use   Vaping status: Never Used  Substance and Sexual Activity   Alcohol use: No   Drug use: No   Sexual activity: Yes  Other Topics Concern   Not on file  Social History Narrative   Lives at home with husband. Children in [place] and [place].   Social Drivers of Corporate investment banker Strain: Low Risk  (09/27/2023)   Overall Financial Resource Strain (CARDIA)    Difficulty of Paying Living Expenses: Not hard at all  Food Insecurity: No Food Insecurity (09/27/2023)   Hunger Vital Sign    Worried About Running Out of Food in the Last Year: Never true    Ran Out of Food in the Last Year: Never true  Transportation Needs: No Transportation Needs (09/27/2023)   PRAPARE - Administrator, Civil Service (Medical): No    Lack of Transportation (Non-Medical): No  Physical Activity: Sufficiently Active (09/27/2023)   Exercise Vital Sign    Days of Exercise per Week: 5 days    Minutes of Exercise per Session: 60 min  Stress: No Stress Concern Present (09/27/2023)   [employer] of Occupational Health - Occupational Stress Questionnaire    Feeling of Stress: Only a little  Social Connections: Socially Integrated (09/27/2023)   Social Connection and Isolation Panel    Frequency of Communication with Friends and Family: More than three times a week  Frequency of Social Gatherings with Friends and Family: Twice a week    Attends Religious Services: More than 4 times per year    Active Member of [employer] or Organizations: Yes    Attends Engineer, structural: More than 4 times per year    Marital Status: Married  Catering manager Violence: Not At Risk (11/25/2022)   Humiliation, Afraid, Rape, and Kick questionnaire    Fear of Current or Ex-Partner: No    Emotionally Abused: No    Physically Abused: No     Sexually Abused: No   Past Surgical History:  Procedure Laterality Date   CERVICAL SPINE SURGERY     herniated disc   Family History  Problem Relation Age of Onset   Heart disease Mother    Kidney disease Mother    Hypertension Mother    Hyperlipidemia Mother    Hypertension Father     Current Outpatient Medications:    Calcium  Carb-Cholecalciferol (CALCIUM  500 +D PO), , Disp: , Rfl:    Cholecalciferol (VITAMIN D ) 2000 UNITS CAPS, Take 1 capsule by mouth daily., Disp: , Rfl:    DEXILANT 60 MG capsule, Take 60 mg by mouth daily., Disp: , Rfl:    esomeprazole  (NEXIUM ) 40 MG capsule, Take 1 capsule (40 mg total) by mouth daily., Disp: 30 capsule, Rfl: 0   estradiol (ESTRACE) 0.1 MG/GM vaginal cream, Place 0.5 g vaginally 2 (two) times a week., Disp: , Rfl:    famotidine (PEPCID) 40 MG tablet, TAKE 1 TABLET BY MOUTH EVERY DAY, Disp: , Rfl:    levothyroxine  (SYNTHROID ) 88 MCG tablet, Take 1 tablet (88 mcg total) by mouth daily before breakfast., Disp: 90 tablet, Rfl: 3   RESTASIS 0.05 % ophthalmic emulsion, , Disp: , Rfl: 3   rosuvastatin  (CRESTOR ) 5 MG tablet, TAKE 1/2 TABLET ON MONDAYS, WEDNESDAYS, AND FRIDAYS., Disp: 18 tablet, Rfl: 3   triamcinolone  (NASACORT ) 55 MCG/ACT AERO nasal inhaler, PLACE 2 SPRAYS INTO THE NOSE DAILY (Patient not taking: Reported on 09/28/2023), Disp: 16.9 each, Rfl: 8  Current Facility-Administered Medications:    cyanocobalamin  ((VITAMIN B-12)) injection 1,000 mcg, 1,000 mcg, Intramuscular, Q30 days, Zaidin Blyden M, DO, 1,000 mcg at 09/07/23 1126  No Known Allergies   ROS: Review of Systems Pertinent items noted in HPI and remainder of comprehensive ROS otherwise negative.    Physical exam BP 130/61   Pulse 80   Temp 98 F (36.7 C)   Ht 5' 2 (1.575 m)   Wt 125 lb (56.7 kg)   SpO2 99%   BMI 22.86 kg/m  General appearance: alert, cooperative, appears stated age, and no distress Head: Normocephalic, without obvious abnormality,  atraumatic Eyes: negative findings: lids and lashes normal, conjunctivae and sclerae normal, corneas clear, and pupils equal, round, reactive to light and accomodation Ears: normal TM's and external ear canals both ears Nose: Nares normal. Septum midline. Mucosa normal. No drainage or sinus tenderness. Throat: lips, mucosa, and tongue normal; teeth and gums normal Neck: no adenopathy, no carotid bruit, supple, symmetrical, trachea midline, and thyroid  not enlarged, symmetric, no tenderness/mass/nodules Back: Limited extension of the C-spine.  Otherwise symmetric Lungs: clear to auscultation bilaterally Heart: regular rate and rhythm, S1, S2 normal, no murmur, click, rub or gallop Abdomen: soft, non-tender; bowel sounds normal; no masses,  no organomegaly Extremities: extremities normal, atraumatic, no cyanosis or edema Pulses: 2+ and symmetric Skin: Skin color, texture, turgor normal. No rashes or lesions Lymph nodes: Cervical, supraclavicular, and axillary nodes normal. Neurologic: Grossly normal  09/28/2023    8:02 AM 02/23/2023    8:27 AM 01/20/2023   10:11 AM  Depression screen PHQ 2/9  Decreased Interest 0 0 0  Down, Depressed, Hopeless 0 0 0  PHQ - 2 Score 0 0 0  Altered sleeping 0 0 0  Tired, decreased energy 0 0 0  Change in appetite 0 0 0  Feeling bad or failure about yourself  0 0 0  Trouble concentrating 0 0 0  Moving slowly or fidgety/restless 0 0 0  Suicidal thoughts 0 0 0  PHQ-9 Score 0 0 0  Difficult doing work/chores Not difficult at all Not difficult at all Not difficult at all      09/28/2023    8:02 AM 02/23/2023    8:28 AM 01/20/2023   10:11 AM 08/25/2022   11:24 AM  GAD 7 : Generalized Anxiety Score  Nervous, Anxious, on Edge 0 0 0 0  Control/stop worrying 0 0 0 0  Worry too much - different things 1 0 0 0  Trouble relaxing 0 0 0 0  Restless 0 0 0 0  Easily annoyed or irritable 0 0 0 0  Afraid - awful might happen 0 0 0 0  Total GAD 7 Score 1 0 0 0   Anxiety Difficulty Not difficult at all Not difficult at all Not difficult at all Not difficult at all   Recent Results (from the past 2160 hours)  CBC     Status: Abnormal   Collection Time: 09/23/23  8:32 AM  Result Value Ref Range   WBC 4.8 3.4 - 10.8 x10E3/uL   RBC 4.09 3.77 - 5.28 x10E6/uL   Hemoglobin 13.7 11.1 - 15.9 g/dL   Hematocrit 58.5 65.9 - 46.6 %   MCV 101 (H) 79 - 97 fL   MCH 33.5 (H) 26.6 - 33.0 pg   MCHC 33.1 31.5 - 35.7 g/dL   RDW 88.1 88.2 - 84.5 %   Platelets 244 150 - 450 x10E3/uL  VITAMIN D  25 Hydroxy (Vit-D Deficiency, Fractures)     Status: None   Collection Time: 09/23/23  8:32 AM  Result Value Ref Range   Vit D, 25-Hydroxy 67.7 30.0 - 100.0 ng/mL    Comment: Vitamin D  deficiency has been defined by the Institute of Medicine and an Endocrine Society practice guideline as a level of serum 25-OH vitamin D  less than 20 ng/mL (1,2). The Endocrine Society went on to further define vitamin D  insufficiency as a level between 21 and 29 ng/mL (2). 1. IOM (Institute of Medicine). 2010. Dietary reference    intakes for calcium  and D. [place]: The    [employer]. 2. Holick MF, Binkley [place], Bischoff-Ferrari HA, et al.    Evaluation, treatment, and prevention of vitamin D     deficiency: an Endocrine Society clinical practice    guideline. JCEM. 2011 Jul; 96(7):1911-30.   Vitamin B12     Status: None   Collection Time: 09/23/23  8:32 AM  Result Value Ref Range   Vitamin B-12 1,164 232 - 1,245 pg/mL  TSH + free T4     Status: None   Collection Time: 09/23/23  8:32 AM  Result Value Ref Range   TSH 0.831 0.450 - 4.500 uIU/mL   Free T4 1.59 0.82 - 1.77 ng/dL  Lipid panel     Status: None   Collection Time: 09/23/23  8:32 AM  Result Value Ref Range   Cholesterol, Total 143 100 - 199 mg/dL  Triglycerides 56 0 - 149 mg/dL   HDL 54 >60 mg/dL   VLDL Cholesterol Cal 12 5 - 40 mg/dL   LDL Chol Calc (NIH) 77 0 - 99 mg/dL   Chol/HDL Ratio 2.6 0.0 -  4.4 ratio    Comment:                                   T. Chol/HDL Ratio                                             Men  Women                               1/2 Avg.Risk  3.4    3.3                                   Avg.Risk  5.0    4.4                                2X Avg.Risk  9.6    7.1                                3X Avg.Risk 23.4   11.0   CMP14+EGFR     Status: None   Collection Time: 09/23/23  8:32 AM  Result Value Ref Range   Glucose 82 70 - 99 mg/dL   BUN 15 8 - 27 mg/dL   Creatinine, Ser 9.21 0.57 - 1.00 mg/dL   eGFR 82 >40 fO/fpw/8.26   BUN/Creatinine Ratio 19 12 - 28   Sodium 142 134 - 144 mmol/L   Potassium 4.7 3.5 - 5.2 mmol/L   Chloride 103 96 - 106 mmol/L   CO2 24 20 - 29 mmol/L   Calcium  9.6 8.7 - 10.3 mg/dL   Total Protein 6.8 6.0 - 8.5 g/dL   Albumin 4.4 3.9 - 4.9 g/dL   Globulin, Total 2.4 1.5 - 4.5 g/dL   Bilirubin Total 0.8 0.0 - 1.2 mg/dL   Alkaline Phosphatase 44 44 - 121 IU/L   AST 27 0 - 40 IU/L   ALT 14 0 - 32 IU/L     Assessment/ Plan: Karla [name] here for annual physical exam.   Fluttering sensation of heart - Plan: LONG TERM MONITOR XT (3-14 DAYS)  Laryngopharyngeal reflux (LPR) - Plan: esomeprazole  (NEXIUM ) 40 MG capsule  Pure hypercholesterolemia - Plan: rosuvastatin  (CRESTOR ) 5 MG tablet  Thoracic aorta atherosclerosis (HCC)  Hypothyroidism due to acquired atrophy of thyroid  - Plan: TSH + free T4, levothyroxine  (SYNTHROID ) 88 MCG tablet  Age-related osteoporosis without current pathological fracture  B12 deficiency  ?  Esophageal spasm versus PVCs versus arrhythmia.  Her cardiac exam today was unremarkable.  Will trial her on Nexium  to replace Dexilant for now.  Advised to take for 2 to 3 weeks and contact me should she have any recurrent fluttering symptoms and we will get her in for Zio patch.  We reviewed all of her labs today which were within normal range.  Given that  her B12 level is normal and she has no evidence of anemia I  am not concerned about the mild elevation in MCV  She will continue cholesterol medication as prescribed for prevention of thoracic aortic atherosclerosis progression  I have placed future orders for thyroid  labs to be collected prior to her next visit in 6 months  She will continue vitamin D  and calcium  for osteoporosis  Continue B12 supplementation  **addendum, ZIO patch placed.  Counseled on healthy lifestyle choices, including diet (rich in fruits, vegetables and lean meats and low in salt and simple carbohydrates) and exercise (at least 30 minutes of moderate physical activity daily).  Patient to follow up 11m  Karla Rann M. Jolinda, DO

## 2023-10-05 ENCOUNTER — Ambulatory Visit: Admitting: Family Medicine

## 2023-10-05 ENCOUNTER — Ambulatory Visit

## 2023-10-05 ENCOUNTER — Encounter: Payer: Self-pay | Admitting: Family Medicine

## 2023-10-05 NOTE — Addendum Note (Signed)
 Addended by: [name] on: 10/05/2023 12:57 PM   Modules accepted: Orders, Level of Service

## 2023-10-06 ENCOUNTER — Other Ambulatory Visit

## 2023-10-06 DIAGNOSIS — R002 Palpitations: Secondary | ICD-10-CM | POA: Diagnosis not present

## 2023-10-07 ENCOUNTER — Ambulatory Visit

## 2023-10-07 ENCOUNTER — Ambulatory Visit (INDEPENDENT_AMBULATORY_CARE_PROVIDER_SITE_OTHER)

## 2023-10-07 DIAGNOSIS — E538 Deficiency of other specified B group vitamins: Secondary | ICD-10-CM | POA: Diagnosis not present

## 2023-10-07 NOTE — Progress Notes (Signed)
 Patient is in office today for a nurse visit for B12 Injection. Patient Injection was given in the  Right deltoid. Patient tolerated injection well.

## 2023-10-20 ENCOUNTER — Other Ambulatory Visit: Payer: Self-pay | Admitting: Family Medicine

## 2023-10-20 DIAGNOSIS — K219 Gastro-esophageal reflux disease without esophagitis: Secondary | ICD-10-CM

## 2023-10-30 ENCOUNTER — Other Ambulatory Visit: Payer: Self-pay | Admitting: Family Medicine

## 2023-10-30 ENCOUNTER — Ambulatory Visit: Payer: Self-pay | Admitting: Family Medicine

## 2023-10-30 DIAGNOSIS — I471 Supraventricular tachycardia, unspecified: Secondary | ICD-10-CM

## 2023-11-09 ENCOUNTER — Ambulatory Visit

## 2023-11-10 ENCOUNTER — Ambulatory Visit (INDEPENDENT_AMBULATORY_CARE_PROVIDER_SITE_OTHER): Admitting: *Deleted

## 2023-11-10 DIAGNOSIS — E538 Deficiency of other specified B group vitamins: Secondary | ICD-10-CM

## 2023-11-10 NOTE — Progress Notes (Signed)
 Patient is in office today for a nurse visit for B12 Injection. Patient Injection was given in the  Left deltoid. Patient tolerated injection well.

## 2023-11-19 ENCOUNTER — Ambulatory Visit: Attending: Cardiology | Admitting: Cardiology

## 2023-11-19 ENCOUNTER — Encounter: Payer: Self-pay | Admitting: Cardiology

## 2023-11-19 VITALS — BP 154/76 | HR 74 | Ht 62.0 in | Wt 124.1 lb

## 2023-11-19 DIAGNOSIS — I471 Supraventricular tachycardia, unspecified: Secondary | ICD-10-CM | POA: Diagnosis not present

## 2023-11-19 DIAGNOSIS — R0609 Other forms of dyspnea: Secondary | ICD-10-CM | POA: Insufficient documentation

## 2023-11-19 DIAGNOSIS — I493 Ventricular premature depolarization: Secondary | ICD-10-CM | POA: Insufficient documentation

## 2023-11-19 DIAGNOSIS — K219 Gastro-esophageal reflux disease without esophagitis: Secondary | ICD-10-CM | POA: Insufficient documentation

## 2023-11-19 MED ORDER — METOPROLOL SUCCINATE ER 25 MG PO TB24: 25.0000 mg | ORAL_TABLET | Freq: Every day | ORAL | 3 refills | Status: AC

## 2023-11-19 NOTE — Addendum Note (Signed)
 Addended by: ARLOA PLANAS D on: 11/19/2023 01:51 PM   Modules accepted: Orders

## 2023-11-19 NOTE — Progress Notes (Signed)
 Cardiology Consultation:    Date:  11/19/2023   ID:  Karla [name], DOB Jan 31, 1951, MRN [id number]  PCP:  [name], DO  Cardiologist:  Lamar Fitch, MD   Referring MD: [name], DO   No chief complaint on file.   History of Present Illness:    Karla [name] is a 73 y.o. female who is being seen today for the evaluation of hide palpitations at the request of Jolinda Norene M, DO.  Past medical history significant for GERD, dyslipidemia, she was referred to us  because of palpitations.  For last few months she experienced some skipped beats as well as more sustained arrhythmia but lasting only for few seconds.  She wore a monitor monitor showed some supraventricular tachycardia as well as PVCs symptomatic.  She comes here to talk about this.  Overall it looks like she is in a very good shape she is to exercise on the regular basis walk on the regular basis but lately she is afraid to do it because of the symptomatology.  I described to have heartburn to typically happen when she sits.  She takes multiple medication for it with some relief.  Does not have any typical exercise chest pain tightness squeezing pressure burning chest.  She never smoked does have family history but no premature coronary artery disease.  She is not on any special diet.  She does not drink caffeinated drinks.  Past Medical History:  Diagnosis Date   GERD (gastroesophageal reflux disease)    Thyroid  disease     Past Surgical History:  Procedure Laterality Date   CERVICAL SPINE SURGERY     herniated disc    Current Medications: No outpatient medications have been marked as taking for the 11/19/23 encounter (Office Visit) with Arrie Borrelli J, MD.   Current Facility-Administered Medications for the 11/19/23 encounter (Office Visit) with Terrel Manalo J, MD  Medication   cyanocobalamin  ((VITAMIN B-12)) injection 1,000 mcg     Allergies:   Patient has no known allergies.    Social History   Socioeconomic History   Marital status: Married    Spouse name: Museum/gallery conservator    Number of children: 2   Years of education: 14   Highest education level: Associate degree: academic program  Occupational History   Occupation: Retired  Tobacco Use   Smoking status: Never   Smokeless tobacco: Never  Vaping Use   Vaping status: Never Used  Substance and Sexual Activity   Alcohol use: No   Drug use: No   Sexual activity: Yes  Other Topics Concern   Not on file  Social History Narrative   Lives at home with husband. Children in [place] and [place].   Social Drivers of Corporate investment banker Strain: Low Risk  (09/27/2023)   Overall Financial Resource Strain (CARDIA)    Difficulty of Paying Living Expenses: Not hard at all  Food Insecurity: No Food Insecurity (09/27/2023)   Hunger Vital Sign    Worried About Running Out of Food in the Last Year: Never true    Ran Out of Food in the Last Year: Never true  Transportation Needs: No Transportation Needs (09/27/2023)   PRAPARE - Administrator, Civil Service (Medical): No    Lack of Transportation (Non-Medical): No  Physical Activity: Sufficiently Active (09/27/2023)   Exercise Vital Sign    Days of Exercise per Week: 5 days    Minutes of Exercise per Session: 60 min  Stress: No Stress  Concern Present (09/27/2023)   [employer] of Occupational Health - Occupational Stress Questionnaire    Feeling of Stress: Only a little  Social Connections: Socially Integrated (09/27/2023)   Social Connection and Isolation Panel    Frequency of Communication with Friends and Family: More than three times a week    Frequency of Social Gatherings with Friends and Family: Twice a week    Attends Religious Services: More than 4 times per year    Active Member of [employer] or Organizations: Yes    Attends Engineer, structural: More than 4 times per year    Marital Status: Married     Family History: The patient's  family history includes Heart disease in her mother; Hyperlipidemia in her mother; Hypertension in her father and mother; Kidney disease in her mother. ROS:   Please see the history of present illness.    All 14 point review of systems negative except as described per history of present illness.  EKGs/Labs/Other Studies Reviewed:    The following studies were reviewed today:   EKG:  EKG Interpretation Date/Time:  Thursday November 19 2023 12:59:20 EDT Ventricular Rate:  74 PR Interval:  140 QRS Duration:  70 QT Interval:  374 QTC Calculation: 415 R Axis:   56  Text Interpretation: Normal sinus rhythm Normal ECG No previous ECGs available Confirmed by Bernie Charleston [phone]) on 11/19/2023 1:05:41 PM    Recent Labs: 09/23/2023: ALT 14; BUN 15; Creatinine, Ser 0.78; Hemoglobin 13.7; Platelets 244; Potassium 4.7; Sodium 142; TSH 0.831  Recent Lipid Panel    Component Value Date/Time   CHOL 143 09/23/2023 0832   TRIG 56 09/23/2023 0832   TRIG 59 12/19/2015 0842   HDL 54 09/23/2023 0832   HDL 57 12/19/2015 0842   CHOLHDL 2.6 09/23/2023 0832   LDLCALC 77 09/23/2023 0832   LDLCALC 98 12/01/2013 0933    Physical Exam:    VS:  BP (!) 154/76   Pulse 74   Ht 5' 2 (1.575 m)   Wt 124 lb 1.3 oz (56.3 kg)   SpO2 99%   BMI 22.69 kg/m     Wt Readings from Last 3 Encounters:  11/19/23 124 lb 1.3 oz (56.3 kg)  09/28/23 125 lb (56.7 kg)  02/23/23 120 lb (54.4 kg)     GEN:  Well nourished, well developed in no acute distress HEENT: Normal NECK: No JVD; No carotid bruits LYMPHATICS: No lymphadenopathy CARDIAC: RRR, no murmurs, no rubs, no gallops RESPIRATORY:  Clear to auscultation without rales, wheezing or rhonchi  ABDOMEN: Soft, non-tender, non-distended MUSCULOSKELETAL:  No edema; No deformity  SKIN: Warm and dry NEUROLOGIC:  Alert and oriented x 3 PSYCHIATRIC:  Normal affect   ASSESSMENT:    1. SVT (supraventricular tachycardia) (HCC)   2. Supraventricular tachycardia  (HCC)   3. PVC's (premature ventricular contractions)   4. Gastroesophageal reflux disease without esophagitis    PLAN:    In order of problems listed above:  Supraventricular tachycardia symptomatic.  Will start therapy I will ask her to start taking metoprolol  succinate 25 mg daily.  As a part of her evaluation echocardiogram will be done to make sure heart is structurally normal. PVCs.  Symptomatic, approach similar to SVT will try small dose of beta-blockers.  Echocardiogram will be done to assess left ventricle ejection fraction. Dyslipidemia she is taking Crestor  2.5 mg 3 times a week.  Last fasting lipid profile revealed an LDL of 77 HDL 54 good control I will ask her  to have a calcium  score to see if we can augment this therapy. Question about high blood pressure is a little elevated today.  But she brought blood pressure measurements from home and those are good.  Will continue monitoring.   Medication Adjustments/Labs and Tests Ordered: Current medicines are reviewed at length with the patient today.  Concerns regarding medicines are outlined above.  Orders Placed This Encounter  Procedures   EKG 12-Lead   No orders of the defined types were placed in this encounter.   Signed, Lamar DOROTHA Fitch, MD, [facility]. 11/19/2023 1:38 PM    [facility]

## 2023-11-19 NOTE — Patient Instructions (Addendum)
 Medication Instructions:   START: Metoprolol  Succinate 25mg  1 tablet daily   Lab Work: None Ordered If you have labs (blood work) drawn today and your tests are completely normal, you will receive your results only by: MyChart Message (if you have MyChart) OR A paper copy in the mail If you have any lab test that is abnormal or we need to change your treatment, we will call you to review the results.   Testing/Procedures: We will order CT coronary calcium  score. It will cost $99.00 and is not covered by insurance.  Please call to schedule.    [facility] [address] [place], [place] [address] [phone]    Your physician has requested that you have an echocardiogram. Echocardiography is a painless test that uses sound waves to create images of your heart. It provides your doctor with information about the size and shape of your heart and how well your heart's chambers and valves are working. This procedure takes approximately one hour. There are no restrictions for this procedure. Please do NOT wear cologne, perfume, aftershave, or lotions (deodorant is allowed). Please arrive 15 minutes prior to your appointment time.  Please note: We ask at that you not bring children with you during ultrasound (echo/ vascular) testing. Due to room size and safety concerns, children are not allowed in the ultrasound rooms during exams. Our front office staff cannot provide observation of children in our lobby area while testing is being conducted. An adult accompanying a patient to their appointment will only be allowed in the ultrasound room at the discretion of the ultrasound technician under special circumstances. We apologize for any inconvenience.    Follow-Up: At [facility], you and your health needs are our priority.  As part of our continuing mission to provide you with exceptional heart care, we have created designated Provider Care Teams.  These Care Teams include  your primary Cardiologist (physician) and Advanced Practice Providers (APPs -  Physician Assistants and Nurse Practitioners) who all work together to provide you with the care you need, when you need it.  We recommend signing up for the patient portal called MyChart.  Sign up information is provided on this After Visit Summary.  MyChart is used to connect with patients for Virtual Visits (Telemedicine).  Patients are able to view lab/test results, encounter notes, upcoming appointments, etc.  Non-urgent messages can be sent to your provider as well.   To learn more about what you can do with MyChart, go to [email or web address].    Your next appointment:   3 month(s)  The format for your next appointment:   In Person  Provider:   Lamar Fitch, MD    Other Instructions NA

## 2023-11-20 ENCOUNTER — Ambulatory Visit (HOSPITAL_BASED_OUTPATIENT_CLINIC_OR_DEPARTMENT_OTHER)
Admission: RE | Admit: 2023-11-20 | Discharge: 2023-11-20 | Disposition: A | Discharge status: Home / Self Care | Source: Ambulatory Visit | Attending: Cardiology | Admitting: Cardiology

## 2023-11-20 DIAGNOSIS — I471 Supraventricular tachycardia, unspecified: Secondary | ICD-10-CM | POA: Insufficient documentation

## 2023-11-26 ENCOUNTER — Ambulatory Visit: Payer: Medicare Other

## 2023-11-26 ENCOUNTER — Ambulatory Visit: Payer: Self-pay | Admitting: Cardiology

## 2023-11-26 VITALS — BP 154/76 | HR 74 | Ht 62.0 in | Wt 124.0 lb

## 2023-11-26 DIAGNOSIS — Z0000 Encounter for general adult medical examination without abnormal findings: Secondary | ICD-10-CM | POA: Diagnosis not present

## 2023-11-26 NOTE — Progress Notes (Signed)
 Subjective:   Karla [name] is a 73 y.o. who presents for a Medicare Wellness preventive visit.  As a reminder, Annual Wellness Visits don't include a physical exam, and some assessments may be limited, especially if this visit is performed virtually. We may recommend an in-person follow-up visit with your provider if needed.  Visit Complete: Virtual I connected with  [facility] on 11/26/23 by a audio enabled telemedicine application and verified that I am speaking with the correct person using two identifiers.  Patient Location: Home  Provider Location: Home Office  I discussed the limitations of evaluation and management by telemedicine. The patient expressed understanding and agreed to proceed.  Vital Signs: Because this visit was a virtual/telehealth visit, some criteria may be missing or patient reported. Any vitals not documented were not able to be obtained and vitals that have been documented are patient reported.  VideoDeclined- This patient declined Librarian, academic. Therefore the visit was completed with audio only.  Persons Participating in Visit: Patient.  AWV Questionnaire: Yes: Patient Medicare AWV questionnaire was completed by the patient on 11/25/23; I have confirmed that all information answered by patient is correct and no changes since this date.        Objective:    Today's Vitals   11/26/23 1332  BP: (!) 154/76  Pulse: 74  Weight: 124 lb (56.2 kg)  Height: 5' 2 (1.575 m)   Body mass index is 22.68 kg/m.     11/26/2023    1:35 PM 03/31/2023    3:31 PM 11/25/2022    9:36 AM 11/22/2021   10:37 AM 11/21/2020   11:25 AM 11/21/2019   11:26 AM 11/16/2018    1:34 PM  Advanced Directives  Does Patient Have a Medical Advance Directive? No No No No No No No  Would patient like information on creating a medical advance directive?  Yes (MAU/Ambulatory/Procedural Areas - Information given) Yes (MAU/Ambulatory/Procedural Areas - Information  given) No - Patient declined No - Patient declined No - Patient declined Yes (MAU/Ambulatory/Procedural Areas - Information given)    Current Medications (verified) Outpatient Encounter Medications as of 11/26/2023  Medication Sig   Calcium  Carb-Cholecalciferol (CALCIUM  500 +D PO)    Cholecalciferol (VITAMIN D ) 2000 UNITS CAPS Take 1 capsule by mouth daily.   esomeprazole  (NEXIUM ) 40 MG capsule TAKE 1 CAPSULE (40 MG TOTAL) BY MOUTH DAILY.   estradiol (ESTRACE) 0.1 MG/GM vaginal cream Place 0.5 g vaginally 2 (two) times a week.   famotidine (PEPCID) 40 MG tablet TAKE 1 TABLET BY MOUTH EVERY DAY   levothyroxine  (SYNTHROID ) 88 MCG tablet Take 1 tablet (88 mcg total) by mouth daily before breakfast.   metoprolol  succinate (TOPROL  XL) 25 MG 24 hr tablet Take 1 tablet (25 mg total) by mouth daily.   RESTASIS 0.05 % ophthalmic emulsion    rosuvastatin  (CRESTOR ) 5 MG tablet TAKE 1/2 TABLET ON MONDAYS, WEDNESDAYS, AND FRIDAYS.   triamcinolone  (NASACORT ) 55 MCG/ACT AERO nasal inhaler PLACE 2 SPRAYS INTO THE NOSE DAILY (Patient not taking: Reported on 11/26/2023)   Facility-Administered Encounter Medications as of 11/26/2023  Medication   cyanocobalamin  ((VITAMIN B-12)) injection 1,000 mcg    Allergies (verified) Patient has no known allergies.   History: Past Medical History:  Diagnosis Date   Arthritis 2013   GERD (gastroesophageal reflux disease)    Thyroid  disease    Past Surgical History:  Procedure Laterality Date   CERVICAL SPINE SURGERY     herniated disc   SPINE SURGERY  July 2013   Cervical Neck Herniated   Family History  Problem Relation Age of Onset   Heart disease Mother    Kidney disease Mother    Hypertension Mother    Hyperlipidemia Mother    Hearing loss Mother    Miscarriages / [place] Mother    Vision loss Mother    Hypertension Father    COPD Father    Hearing loss Father    Asthma Brother    Hearing loss Brother    COPD Brother    Hyperlipidemia Brother     Hypertension Brother    Stroke Brother    Social History   Socioeconomic History   Marital status: Married    Spouse name: Museum/gallery conservator    Number of children: 2   Years of education: 14   Highest education level: Associate degree: academic program  Occupational History   Occupation: Retired  Tobacco Use   Smoking status: Never   Smokeless tobacco: Never  Vaping Use   Vaping status: Never Used  Substance and Sexual Activity   Alcohol use: Never   Drug use: Never   Sexual activity: Not Currently    Birth control/protection: None  Other Topics Concern   Not on file  Social History Narrative   Lives at home with husband. Children in [place] and [employer].   Social Drivers of Corporate investment banker Strain: Low Risk  (11/26/2023)   Overall Financial Resource Strain (CARDIA)    Difficulty of Paying Living Expenses: Not hard at all  Food Insecurity: No Food Insecurity (11/26/2023)   Hunger Vital Sign    Worried About Running Out of Food in the Last Year: Never true    Ran Out of Food in the Last Year: Never true  Transportation Needs: No Transportation Needs (11/26/2023)   PRAPARE - Administrator, Civil Service (Medical): No    Lack of Transportation (Non-Medical): No  Physical Activity: Sufficiently Active (11/26/2023)   Exercise Vital Sign    Days of Exercise per Week: 5 days    Minutes of Exercise per Session: 60 min  Stress: No Stress Concern Present (11/26/2023)   [employer] of Occupational Health - Occupational Stress Questionnaire    Feeling of Stress: Not at all  Social Connections: Socially Integrated (11/26/2023)   Social Connection and Isolation Panel    Frequency of Communication with Friends and Family: More than three times a week    Frequency of Social Gatherings with Friends and Family: Twice a week    Attends Religious Services: More than 4 times per year    Active Member of [employer] or Organizations: Yes    Attends Engineer, structural:  More than 4 times per year    Marital Status: Married    Tobacco Counseling Counseling given: Yes    Clinical Intake:  Pre-visit preparation completed: Yes  Pain : No/denies pain     BMI - recorded: 22.68 Nutritional Status: BMI 25 -29 Overweight Nutritional Risks: None Diabetes: No  No results found for: HGBA1C   How often do you need to have someone help you when you read instructions, pamphlets, or other written materials from your doctor or pharmacy?: 1 - Never  Interpreter Needed?: No  Information entered by :: alia t/cma   Activities of Daily Living     11/25/2023   10:10 AM  In your present state of health, do you have any difficulty performing the following activities:  Hearing? 0  Vision? 0  Difficulty concentrating or making decisions? 0  Walking or climbing stairs? 0  Dressing or bathing? 0  Doing errands, shopping? 0  Preparing Food and eating ? N  Using the Toilet? N  In the past six months, have you accidently leaked urine? N  Do you have problems with loss of bowel control? N  Managing your Medications? N  Managing your Finances? N  Housekeeping or managing your Housekeeping? N    Patient Care Team: [name], DO as PCP - General (Family Medicine) Key, Hargis HERO, NP as Nurse Practitioner (Gynecology) Luis Purchase, MD as Consulting Physician (Gastroenterology) Camillo Golas, MD as Attending Physician (Ophthalmology) Ivin Kocher, MD as Referring Physician (Dermatology)  I have updated your Care Teams any recent Medical Services you may have received from other providers in the past year.     Assessment:   This is a routine wellness examination for Karla [name].  Hearing/Vision screen Hearing Screening - Comments:: Pt denies hearing dif Vision Screening - Comments:: Pt wear glasses/pt goes to [facility] in [place] ov 2/25   Goals Addressed             This Visit's Progress    Exercise 3x per week  (30 min per time)   On track    Continue to exercise for at least 30 minutes, 3 times weekly.  Stay healthy, stay active with church, family, grandchildren, etc       Depression Screen     11/26/2023    1:35 PM 09/28/2023    8:02 AM 02/23/2023    8:27 AM 01/20/2023   10:11 AM 11/25/2022    9:35 AM 08/25/2022   11:17 AM 07/16/2022    1:04 PM  PHQ 2/9 Scores  PHQ - 2 Score 0 0 0 0 0 0 0  PHQ- 9 Score 0 0 0 0  0 0    Fall Risk     11/25/2023   10:10 AM 02/23/2023    8:28 AM 01/20/2023   10:11 AM 11/25/2022    9:33 AM 11/24/2022    4:28 PM  Fall Risk   Falls in the past year? 0 0 0 0 0  Number falls in past yr:  0 0 0 0  Injury with Fall?  0 0 0   Risk for fall due to :  No Fall Risks No Fall Risks No Fall Risks   Follow up  Education provided Falls evaluation completed;Education provided Falls prevention discussed     MEDICARE RISK AT HOME:  Medicare Risk at Home Any stairs in or around the home?: (Patient-Rptd) Yes If so, are there any without handrails?: (Patient-Rptd) Yes Home free of loose throw rugs in walkways, pet beds, electrical cords, etc?: (Patient-Rptd) Yes Adequate lighting in your home to reduce risk of falls?: (Patient-Rptd) Yes Life alert?: (Patient-Rptd) No Use of a cane, walker or w/c?: (Patient-Rptd) No Grab bars in the bathroom?: (Patient-Rptd) Yes Shower chair or bench in shower?: (Patient-Rptd) Yes Elevated toilet seat or a handicapped toilet?: (Patient-Rptd) Yes  TIMED UP AND GO:  Was the test performed?  no  Cognitive Function: 6CIT completed        11/26/2023    1:38 PM 11/25/2022    9:36 AM 11/22/2021   10:38 AM 11/21/2019   11:28 AM 11/16/2018    1:35 PM  6CIT Screen  What Year? 0 points 0 points 0 points 0 points 0 points  What month? 0 points 0 points 0 points 0 points 0 points  What time? 0 points 0 points 0 points 0 points 0 points  Count back from 20 0 points 0 points 0 points 0 points 0 points  Months in reverse 0 points 0 points 0 points 0  points 0 points  Repeat phrase 0 points 0 points 0 points 0 points 0 points  Total Score 0 points 0 points 0 points 0 points 0 points    Immunizations Immunization History  Administered Date(s) Administered   Fluad Quad(high Dose 65+) 12/30/2018, 01/04/2020, 01/31/2021, 02/21/2022   INFLUENZA, HIGH DOSE SEASONAL PF 01/04/2018   Influenza,inj,Quad PF,6+ Mos 01/04/2014, 12/22/2014, 12/19/2015, 01/08/2017   Influenza-Unspecified 01/30/2023   PFIZER(Purple Top)SARS-COV-2 Vaccination 04/13/2019, 05/02/2019, 01/04/2020   Pneumococcal Conjugate-13 01/04/2014   Pneumococcal Polysaccharide-23 04/08/2018   Tdap 12/01/2013   Zoster, Live 06/05/2014    Screening Tests Health Maintenance  Topic Date Due   INFLUENZA VACCINE  10/23/2023   COVID-19 Vaccine (4 - 2025-26 season) 11/23/2023   Medicare Annual Wellness (AWV)  11/25/2023   Zoster Vaccines- Shingrix (1 of 2) 12/29/2023 (Originally 10/21/2002)   DTaP/Tdap/Td (2 - Td or Tdap) 12/02/2023   MAMMOGRAM  06/10/2024   DEXA SCAN  03/02/2025   Colonoscopy  09/16/2032   Pneumococcal Vaccine: 50+ Years  Completed   Hepatitis C Screening  Completed   HPV VACCINES  Aged Out   Meningococcal B Vaccine  Aged Out    Health Maintenance  Health Maintenance Due  Topic Date Due   INFLUENZA VACCINE  10/23/2023   COVID-19 Vaccine (4 - 2025-26 season) 11/23/2023   Medicare Annual Wellness (AWV)  11/25/2023   Health Maintenance Items Addressed: See Nurse Notes at the end of this note  Additional Screening:  Vision Screening: Recommended annual ophthalmology exams for early detection of glaucoma and other disorders of the eye. Would you like a referral to an eye doctor? No    Dental Screening: Recommended annual dental exams for proper oral hygiene  Community Resource Referral / Chronic Care Management: CRR required this visit?  No   CCM required this visit?  No   Plan:    I have personally reviewed and noted the following in the  patient's chart:   Medical and social history Use of alcohol, tobacco or illicit drugs  Current medications and supplements including opioid prescriptions. Patient is not currently taking opioid prescriptions. Functional ability and status Nutritional status Physical activity Advanced directives List of other physicians Hospitalizations, surgeries, and ER visits in previous 12 months Vitals Screenings to include cognitive, depression, and falls Referrals and appointments  In addition, I have reviewed and discussed with patient certain preventive protocols, quality metrics, and best practice recommendations. A written personalized care plan for preventive services as well as general preventive health recommendations were provided to patient.   Ozie Ned, CMA   11/26/2023   After Visit Summary: (MyChart) Due to this being a telephonic visit, the after visit summary with patients personalized plan was offered to patient via MyChart   Notes: Nothing significant to report at this time.

## 2023-11-26 NOTE — Patient Instructions (Addendum)
 Karla [name] , Thank you for taking time out of your busy schedule to complete your Annual Wellness Visit with me. I enjoyed our conversation and look forward to speaking with you again next year. I, as well as your care team,  appreciate your ongoing commitment to your health goals. Please review the following plan we discussed and let me know if I can assist you in the future. Your Game plan/ To Do List    Referrals: If you haven't heard from the office you've been referred to, please reach out to them at the phone provided.   Follow up Visits: We will see or speak with you next year for your Next Medicare AWV with our clinical staff on 11/29/24 at 12:30p.m. Have you seen your provider in the last 6 months (3 months if uncontrolled diabetes)? Yes  Clinician Recommendations:  Aim for 30 minutes of exercise or brisk walking, 6-8 glasses of water, and 5 servings of fruits and vegetables each day.       This is a list of the screenings recommended for you:  Health Maintenance  Topic Date Due   Flu Shot  10/23/2023   COVID-19 Vaccine (4 - 2025-26 season) 11/23/2023   Medicare Annual Wellness Visit  11/25/2023   Zoster (Shingles) Vaccine (1 of 2) 12/29/2023*   DTaP/Tdap/Td vaccine (2 - Td or Tdap) 12/02/2023   Mammogram  06/10/2024   DEXA scan (bone density measurement)  03/02/2025   Colon Cancer Screening  09/16/2032   Pneumococcal Vaccine for age over 54  Completed   Hepatitis C Screening  Completed   HPV Vaccine  Aged Out   Meningitis B Vaccine  Aged Out  *Topic was postponed. The date shown is not the original due date.    Advanced directives: (Declined) Advance directive discussed with you today. Even though you declined this today, please call our office should you change your mind, and we can give you the proper paperwork for you to fill out. Advance Care Planning is important because it:  [x]  Makes sure you receive the medical care that is consistent with your values, goals, and  preferences  [x]  It provides guidance to your family and loved ones and reduces their decisional burden about whether or not they are making the right decisions based on your wishes.  Follow the link provided in your after visit summary or read over the paperwork we have mailed to you to help you started getting your Advance Directives in place. If you need assistance in completing these, please reach out to us  so that we can help you!  See attachments for Preventive Care and Fall Prevention Tips.

## 2023-12-02 ENCOUNTER — Ambulatory Visit (HOSPITAL_BASED_OUTPATIENT_CLINIC_OR_DEPARTMENT_OTHER)
Admission: RE | Admit: 2023-12-02 | Discharge: 2023-12-02 | Disposition: A | Discharge status: Home / Self Care | Source: Ambulatory Visit | Attending: Cardiology | Admitting: Cardiology

## 2023-12-02 ENCOUNTER — Telehealth: Payer: Self-pay

## 2023-12-02 DIAGNOSIS — R0609 Other forms of dyspnea: Secondary | ICD-10-CM | POA: Insufficient documentation

## 2023-12-02 NOTE — Telephone Encounter (Signed)
 Pt viewed CT Ca Score results on My Chart per Dr. Karry note. Routed to PCP.

## 2023-12-03 LAB — ECHOCARDIOGRAM COMPLETE
AR max vel: 1.91 cm2
AV Area VTI: 1.95 cm2
AV Area mean vel: 1.91 cm2
AV Mean grad: 3 mmHg
AV Peak grad: 5.8 mmHg
Ao pk vel: 1.2 m/s
Area-P 1/2: 4.49 cm2
Calc EF: 60.3 %
MV M vel: 3.13 m/s
MV Peak grad: 39.2 mmHg
S' Lateral: 2.1 cm
Single Plane A2C EF: 60.1 %
Single Plane A4C EF: 60.1 %

## 2023-12-10 ENCOUNTER — Telehealth: Payer: Self-pay

## 2023-12-10 NOTE — Telephone Encounter (Signed)
 Pt viewed Echo results on My Chart per Dr. Vanetta Shawl note. Routed to PCP.

## 2023-12-11 ENCOUNTER — Ambulatory Visit (INDEPENDENT_AMBULATORY_CARE_PROVIDER_SITE_OTHER): Admitting: Family Medicine

## 2023-12-11 DIAGNOSIS — E538 Deficiency of other specified B group vitamins: Secondary | ICD-10-CM | POA: Diagnosis not present

## 2023-12-11 NOTE — Progress Notes (Signed)
 Patient given b12 injection in right arm. Patient tolerated well.

## 2024-01-11 ENCOUNTER — Encounter: Payer: Self-pay | Admitting: Family Medicine

## 2024-01-15 ENCOUNTER — Ambulatory Visit

## 2024-01-18 ENCOUNTER — Ambulatory Visit (INDEPENDENT_AMBULATORY_CARE_PROVIDER_SITE_OTHER)

## 2024-01-18 DIAGNOSIS — E538 Deficiency of other specified B group vitamins: Secondary | ICD-10-CM | POA: Diagnosis not present

## 2024-01-18 NOTE — Progress Notes (Signed)
 Patient is in office today for a nurse visit for B12 Injection. Patient Injection was given in the  Left deltoid. Patient tolerated injection well.

## 2024-01-22 ENCOUNTER — Ambulatory Visit

## 2024-01-25 ENCOUNTER — Encounter: Payer: Self-pay | Admitting: Radiology

## 2024-02-22 ENCOUNTER — Ambulatory Visit (INDEPENDENT_AMBULATORY_CARE_PROVIDER_SITE_OTHER): Payer: Self-pay | Admitting: *Deleted

## 2024-02-22 ENCOUNTER — Encounter: Payer: Self-pay | Admitting: Cardiology

## 2024-02-22 ENCOUNTER — Ambulatory Visit: Attending: Cardiology | Admitting: Cardiology

## 2024-02-22 VITALS — BP 160/64 | HR 66 | Ht 62.0 in | Wt 128.0 lb

## 2024-02-22 DIAGNOSIS — I471 Supraventricular tachycardia, unspecified: Secondary | ICD-10-CM | POA: Diagnosis not present

## 2024-02-22 DIAGNOSIS — E538 Deficiency of other specified B group vitamins: Secondary | ICD-10-CM

## 2024-02-22 DIAGNOSIS — R931 Abnormal findings on diagnostic imaging of heart and coronary circulation: Secondary | ICD-10-CM | POA: Insufficient documentation

## 2024-02-22 DIAGNOSIS — E034 Atrophy of thyroid (acquired): Secondary | ICD-10-CM | POA: Diagnosis present

## 2024-02-22 DIAGNOSIS — I493 Ventricular premature depolarization: Secondary | ICD-10-CM | POA: Diagnosis present

## 2024-02-22 NOTE — Patient Instructions (Signed)

## 2024-02-22 NOTE — Progress Notes (Signed)
 Patient is in office today for a nurse visit for B12 Injection.  Injection was given in the  Right deltoid. Patient tolerated injection well.

## 2024-02-22 NOTE — Progress Notes (Unsigned)
 Cardiology Office Note:    Date:  02/22/2024   ID:  Karla [name], DOB [birth year], MRN [id number]  PCP:  [name], DO  Cardiologist:  Lamar Fitch, MD    Referring MD: [name], DO   Chief Complaint  Patient presents with   Follow-up    History of Present Illness:    Karla [name] is a 73 y.o. female came to me because of palpitations she did have PVCs and supraventricular tachycardia metoprolol  has been giving doing much better.  Also elevated calcium  score 17 which is 49 percentile, dyslipidemia.  Comes today to talk about this, overall doing well.  Denies of any chest pain tightness squeezing pressure burning chest no palpitation dizziness swelling of lower extremities.  Past Medical History:  Diagnosis Date   Arthritis 2013   GERD (gastroesophageal reflux disease)    Thyroid  disease     Past Surgical History:  Procedure Laterality Date   CERVICAL SPINE SURGERY     herniated disc   SPINE SURGERY  July 2013   Cervical Neck Herniated    Current Medications: Current Meds  Medication Sig   Calcium  Carb-Cholecalciferol (CALCIUM  500 +D PO)    Cholecalciferol (VITAMIN D ) 2000 UNITS CAPS Take 1 capsule by mouth daily.   DEXILANT 60 MG capsule Take 60 mg by mouth daily.   estradiol (ESTRACE) 0.1 MG/GM vaginal cream Place 0.5 g vaginally 2 (two) times a week.   famotidine (PEPCID) 40 MG tablet TAKE 1 TABLET BY MOUTH EVERY DAY   ibandronate (BONIVA) 150 MG tablet Take 150 mg by mouth every 30 (thirty) days.   levothyroxine  (SYNTHROID ) 88 MCG tablet Take 1 tablet (88 mcg total) by mouth daily before breakfast.   metoprolol  succinate (TOPROL  XL) 25 MG 24 hr tablet Take 1 tablet (25 mg total) by mouth daily.   RESTASIS 0.05 % ophthalmic emulsion    rosuvastatin  (CRESTOR ) 5 MG tablet TAKE 1/2 TABLET ON MONDAYS, WEDNESDAYS, AND FRIDAYS.   triamcinolone  (NASACORT ) 55 MCG/ACT AERO nasal inhaler PLACE 2 SPRAYS INTO THE NOSE DAILY   Current Facility-Administered  Medications for the 02/22/24 encounter (Office Visit) with Phiona Ramnauth J, MD  Medication   cyanocobalamin  ((VITAMIN B-12)) injection 1,000 mcg     Allergies:   Patient has no known allergies.   Social History   Socioeconomic History   Marital status: Married    Spouse name: Museum/gallery Conservator    Number of children: 2   Years of education: 14   Highest education level: Associate degree: academic program  Occupational History   Occupation: Retired  Tobacco Use   Smoking status: Never   Smokeless tobacco: Never  Vaping Use   Vaping status: Never Used  Substance and Sexual Activity   Alcohol use: Never   Drug use: Never   Sexual activity: Not Currently    Birth control/protection: None  Other Topics Concern   Not on file  Social History Narrative   Lives at home with husband. Children in [place] and [place].   Social Drivers of Corporate Investment Banker Strain: Low Risk  (11/26/2023)   Overall Financial Resource Strain (CARDIA)    Difficulty of Paying Living Expenses: Not hard at all  Food Insecurity: No Food Insecurity (11/26/2023)   Hunger Vital Sign    Worried About Running Out of Food in the Last Year: Never true    Ran Out of Food in the Last Year: Never true  Transportation Needs: No Transportation Needs (11/26/2023)   PRAPARE - Transportation  Lack of Transportation (Medical): No    Lack of Transportation (Non-Medical): No  Physical Activity: Sufficiently Active (11/26/2023)   Exercise Vital Sign    Days of Exercise per Week: 5 days    Minutes of Exercise per Session: 60 min  Stress: No Stress Concern Present (11/26/2023)   [employer] of Occupational Health - Occupational Stress Questionnaire    Feeling of Stress: Not at all  Social Connections: Socially Integrated (11/26/2023)   Social Connection and Isolation Panel    Frequency of Communication with Friends and Family: More than three times a week    Frequency of Social Gatherings with Friends and Family: Twice a  week    Attends Religious Services: More than 4 times per year    Active Member of [employer] or Organizations: Yes    Attends Engineer, Structural: More than 4 times per year    Marital Status: Married     Family History: The patient's family history includes Asthma in her brother; COPD in her brother and father; Hearing loss in her brother, father, and mother; Heart disease in her mother; Hyperlipidemia in her brother and mother; Hypertension in her brother, father, and mother; Kidney disease in her mother; Miscarriages / Stillbirths in her mother; Stroke in her brother; Vision loss in her mother. ROS:   Please see the history of present illness.    All 14 point review of systems negative except as described per history of present illness  EKGs/Labs/Other Studies Reviewed:         Recent Labs: 09/23/2023: ALT 14; BUN 15; Creatinine, Ser 0.78; Hemoglobin 13.7; Platelets 244; Potassium 4.7; Sodium 142; TSH 0.831  Recent Lipid Panel    Component Value Date/Time   CHOL 143 09/23/2023 0832   TRIG 56 09/23/2023 0832   TRIG 59 12/19/2015 0842   HDL 54 09/23/2023 0832   HDL 57 12/19/2015 0842   CHOLHDL 2.6 09/23/2023 0832   LDLCALC 77 09/23/2023 0832   LDLCALC 98 12/01/2013 0933    Physical Exam:    VS:  BP (!) 160/64   Pulse 66   Ht 5' 2 (1.575 m)   Wt 128 lb (58.1 kg)   SpO2 99%   BMI 23.41 kg/m     Wt Readings from Last 3 Encounters:  02/22/24 128 lb (58.1 kg)  11/26/23 124 lb (56.2 kg)  11/19/23 124 lb 1.3 oz (56.3 kg)     GEN:  Well nourished, well developed in no acute distress HEENT: Normal NECK: No JVD; No carotid bruits LYMPHATICS: No lymphadenopathy CARDIAC: RRR, no murmurs, no rubs, no gallops RESPIRATORY:  Clear to auscultation without rales, wheezing or rhonchi  ABDOMEN: Soft, non-tender, non-distended MUSCULOSKELETAL:  No edema; No deformity  SKIN: Warm and dry LOWER EXTREMITIES: no swelling NEUROLOGIC:  Alert and oriented x 3 PSYCHIATRIC:   Normal affect   ASSESSMENT:    1. PVC's (premature ventricular contractions)   2. Supraventricular tachycardia   3. Hypothyroidism due to acquired atrophy of thyroid    4. Elevated coronary artery calcium  score 17 which is 49 percentile    PLAN:    In order of problems listed above:  PVCs supraventricular tachycardia very rare palpitations she is happy the way she is will continue small dose of beta-blocker. Elevated calcium  score but she is taking already statin today with her cholesterol showing LDL 77 HDL 54 good control continue present management. Hypothyroidism on Synthroid  will continue.   Medication Adjustments/Labs and Tests Ordered: Current medicines are reviewed at length  with the patient today.  Concerns regarding medicines are outlined above.  No orders of the defined types were placed in this encounter.  Medication changes: No orders of the defined types were placed in this encounter.   Signed, Lamar DOROTHA Fitch, MD, [facility] 02/22/2024 3:56 PM    [facility]

## 2024-03-22 ENCOUNTER — Other Ambulatory Visit

## 2024-03-22 DIAGNOSIS — E034 Atrophy of thyroid (acquired): Secondary | ICD-10-CM

## 2024-03-22 LAB — TSH+FREE T4
Free T4: 1.55 ng/dL (ref 0.82–1.77)
TSH: 1.61 u[IU]/mL (ref 0.450–4.500)

## 2024-03-25 ENCOUNTER — Ambulatory Visit (INDEPENDENT_AMBULATORY_CARE_PROVIDER_SITE_OTHER): Admitting: *Deleted

## 2024-03-25 DIAGNOSIS — E538 Deficiency of other specified B group vitamins: Secondary | ICD-10-CM | POA: Diagnosis not present

## 2024-03-25 NOTE — Progress Notes (Signed)
 Patient is in office today for a nurse visit for B12 Injection. Patient Injection was given in the  Left deltoid. Patient tolerated injection well.

## 2024-03-30 ENCOUNTER — Encounter: Payer: Self-pay | Admitting: Family Medicine

## 2024-03-30 ENCOUNTER — Ambulatory Visit: Payer: Self-pay | Admitting: Family Medicine

## 2024-03-30 VITALS — BP 147/68 | HR 65 | Temp 98.1°F | Ht 62.0 in | Wt 127.1 lb

## 2024-03-30 DIAGNOSIS — I471 Supraventricular tachycardia, unspecified: Secondary | ICD-10-CM | POA: Diagnosis not present

## 2024-03-30 DIAGNOSIS — Z23 Encounter for immunization: Secondary | ICD-10-CM | POA: Diagnosis not present

## 2024-03-30 DIAGNOSIS — E034 Atrophy of thyroid (acquired): Secondary | ICD-10-CM | POA: Diagnosis not present

## 2024-03-30 DIAGNOSIS — E7800 Pure hypercholesterolemia, unspecified: Secondary | ICD-10-CM | POA: Diagnosis not present

## 2024-03-30 DIAGNOSIS — E538 Deficiency of other specified B group vitamins: Secondary | ICD-10-CM

## 2024-03-30 DIAGNOSIS — M810 Age-related osteoporosis without current pathological fracture: Secondary | ICD-10-CM

## 2024-03-30 NOTE — Progress Notes (Signed)
 "  Subjective: RR:Karla [name] PCP: [name], DO YEP:Karla [name] is a 74 y.o. female presenting to clinic today for:  Patient reports compliance with medications.  Her thyroid  levels were normal.  She reports no red flag signs or symptoms.  Heart palpitations seem to be better after initiation of metoprolol  by cardiology.  She does note some intermittently low blood pressures but does not report any symptomology from this.  Her blood pressures are ranging anywhere from systolic of 95-128 over 60s.  Typically in the low 100s over 60s.   ROS: Per HPI  Allergies[1] Past Medical History:  Diagnosis Date   Arthritis 2013   GERD (gastroesophageal reflux disease)    Thyroid  disease    Current Medications[2] Social History   Socioeconomic History   Marital status: Married    Spouse name: Museum/gallery Conservator    Number of children: 2   Years of education: 14   Highest education level: Associate degree: academic program  Occupational History   Occupation: Retired  Tobacco Use   Smoking status: Never   Smokeless tobacco: Never  Vaping Use   Vaping status: Never Used  Substance and Sexual Activity   Alcohol use: Never   Drug use: Never   Sexual activity: Not Currently    Birth control/protection: None  Other Topics Concern   Not on file  Social History Narrative   Lives at home with husband. Children in [place] and [place].   Social Drivers of Health   Tobacco Use: Low Risk (02/22/2024)   Patient History    Smoking Tobacco Use: Never    Smokeless Tobacco Use: Never    Passive Exposure: Not on file  Financial Resource Strain: Low Risk (03/29/2024)   Overall Financial Resource Strain (CARDIA)    Difficulty of Paying Living Expenses: Not hard at all  Food Insecurity: No Food Insecurity (03/29/2024)   Epic    Worried About Programme Researcher, Broadcasting/film/video in the Last Year: Never true    Ran Out of Food in the Last Year: Never true  Transportation Needs: No Transportation Needs (03/29/2024)    Epic    Lack of Transportation (Medical): No    Lack of Transportation (Non-Medical): No  Physical Activity: Sufficiently Active (03/29/2024)   Exercise Vital Sign    Days of Exercise per Week: 7 days    Minutes of Exercise per Session: 30 min  Stress: No Stress Concern Present (03/29/2024)   [employer] of Occupational Health - Occupational Stress Questionnaire    Feeling of Stress: Only a little  Social Connections: Socially Integrated (03/29/2024)   Social Connection and Isolation Panel    Frequency of Communication with Friends and Family: More than three times a week    Frequency of Social Gatherings with Friends and Family: Twice a week    Attends Religious Services: More than 4 times per year    Active Member of Clubs or Organizations: Yes    Attends Banker Meetings: More than 4 times per year    Marital Status: Married  Catering Manager Violence: Not At Risk (11/26/2023)   Epic    Fear of Current or Ex-Partner: No    Emotionally Abused: No    Physically Abused: No    Sexually Abused: No  Depression (PHQ2-9): Low Risk (11/26/2023)   Depression (PHQ2-9)    PHQ-2 Score: 0  Alcohol Screen: Low Risk (11/26/2023)   Alcohol Screen    Last Alcohol Screening Score (AUDIT): 0  Housing: Low Risk (03/29/2024)   Epic  Unable to Pay for Housing in the Last Year: No    Number of Times Moved in the Last Year: 0    Homeless in the Last Year: No  Utilities: Not At Risk (11/26/2023)   Epic    Threatened with loss of utilities: No  Health Literacy: Adequate Health Literacy (11/26/2023)   B1300 Health Literacy    Frequency of need for help with medical instructions: Never   Family History  Problem Relation Age of Onset   Heart disease Mother    Kidney disease Mother    Hypertension Mother    Hyperlipidemia Mother    Hearing loss Mother    Miscarriages / Stillbirths Mother    Vision loss Mother    Hypertension Father    COPD Father    Hearing loss Father    Asthma Brother     Hearing loss Brother    COPD Brother    Hyperlipidemia Brother    Hypertension Brother    Stroke Brother     Objective: Office vital signs reviewed. BP (!) 147/68   Pulse 65   Temp 98.1 F (36.7 C)   Ht 5' 2 (1.575 m)   Wt 127 lb 2 oz (57.7 kg)   SpO2 100%   BMI 23.25 kg/m   Physical Examination:  General: Awake, alert, well nourished, No acute distress HEENT: Sclera white.  No exophthalmos.  No goiter Cardio: regular rate and rhythm, S1S2 heard, no murmurs appreciated Pulm: clear to auscultation bilaterally, no wheezes, rhonchi or rales; normal work of breathing on room air Neuro: No tremor  Assessment/ Plan: 74 y.o. female   Hypothyroidism due to acquired atrophy of thyroid  - Plan: Comprehensive metabolic panel with GFR, TSH + free T4  B12 deficiency - Plan: Comprehensive metabolic panel with GFR, CBC with Differential/Platelet, Vitamin B12  Age-related osteoporosis without current pathological fracture - Plan: Comprehensive metabolic panel with GFR, Vitamin D , 25-hydroxy  Pure hypercholesterolemia - Plan: Comprehensive metabolic panel with GFR, Lipid panel  SVT (supraventricular tachycardia) - Plan: Comprehensive metabolic panel with GFR  Immunization due - Plan: Tdap vaccine greater than or equal to 7yo IM   Reviewed thyroid  levels.  These are within acceptable range.  Has refills through July.  I have placed future orders for fasting labs for her physical which we will have in July.  She is up-to-date on DEXA scan etc.  SVT controlled with beta-blocker.  Tetanus shot given today but she declined influenza shot for now.  She was counseled on this.  And understands risk versus benefit of medication  Not due for B12 shot yet.  Level has been ordered   Norene CHRISTELLA Fielding, DO [facility] [phone]     [1] No Known Allergies [2]  Current Outpatient Medications:    Calcium  Carb-Cholecalciferol (CALCIUM  500 +D PO), , Disp: , Rfl:     Cholecalciferol (VITAMIN D ) 2000 UNITS CAPS, Take 1 capsule by mouth daily., Disp: , Rfl:    DEXILANT 60 MG capsule, Take 60 mg by mouth daily., Disp: , Rfl:    estradiol (ESTRACE) 0.1 MG/GM vaginal cream, Place 0.5 g vaginally 2 (two) times a week., Disp: , Rfl:    famotidine (PEPCID) 40 MG tablet, TAKE 1 TABLET BY MOUTH EVERY DAY, Disp: , Rfl:    ibandronate (BONIVA) 150 MG tablet, Take 150 mg by mouth every 30 (thirty) days., Disp: , Rfl:    levothyroxine  (SYNTHROID ) 88 MCG tablet, Take 1 tablet (88 mcg total) by mouth daily  before breakfast., Disp: 90 tablet, Rfl: 3   metoprolol  succinate (TOPROL  XL) 25 MG 24 hr tablet, Take 1 tablet (25 mg total) by mouth daily., Disp: 90 tablet, Rfl: 3   RESTASIS 0.05 % ophthalmic emulsion, , Disp: , Rfl: 3   rosuvastatin  (CRESTOR ) 5 MG tablet, TAKE 1/2 TABLET ON MONDAYS, WEDNESDAYS, AND FRIDAYS., Disp: 18 tablet, Rfl: 3   triamcinolone  (NASACORT ) 55 MCG/ACT AERO nasal inhaler, PLACE 2 SPRAYS INTO THE NOSE DAILY, Disp: 16.9 each, Rfl: 8  Current Facility-Administered Medications:    cyanocobalamin  ((VITAMIN B-12)) injection 1,000 mcg, 1,000 mcg, Intramuscular, Q30 days, Viet Kemmerer M, DO, 1,000 mcg at 03/25/24 1029  "

## 2024-03-30 NOTE — Patient Instructions (Signed)
 Future labs are in.  Schedule a lab appt before you leave today.

## 2024-04-25 ENCOUNTER — Ambulatory Visit

## 2024-04-27 ENCOUNTER — Ambulatory Visit (INDEPENDENT_AMBULATORY_CARE_PROVIDER_SITE_OTHER): Admitting: *Deleted

## 2024-04-27 DIAGNOSIS — E538 Deficiency of other specified B group vitamins: Secondary | ICD-10-CM

## 2024-04-27 NOTE — Progress Notes (Signed)
 Patient is in office today for a nurse visit for B12 Injection. Patient Injection was given in the  Right deltoid. Patient tolerated injection well.

## 2024-05-25 ENCOUNTER — Ambulatory Visit

## 2024-10-28 ENCOUNTER — Other Ambulatory Visit

## 2024-11-01 ENCOUNTER — Encounter: Admitting: Family Medicine

## 2024-11-29 ENCOUNTER — Ambulatory Visit: Payer: Self-pay
